# Patient Record
Sex: Male | Born: 1980 | Race: White | Hispanic: No | Marital: Single | Smoking: Never smoker
Health system: Southern US, Community
[De-identification: ages and names within clinical notes are randomized; demographics above are authoritative.]

## PROBLEM LIST (undated history)

## (undated) DIAGNOSIS — F419 Anxiety disorder, unspecified: Secondary | ICD-10-CM

## (undated) DIAGNOSIS — F32A Depression, unspecified: Secondary | ICD-10-CM

## (undated) NOTE — ED Notes (Signed)
Formatting of this note might be different from the original.  Pt refused vitals  Electronically signed by Fulton Mole, RN at 03/10/2021  8:55 PM PST

## (undated) NOTE — ED Notes (Signed)
Formatting of this note might be different from the original.  Pt sleeping comfortably   Electronically signed by Fulton Mole, RN at 03/10/2021 11:00 PM PST

## (undated) NOTE — ED Provider Notes (Signed)
Formatting of this note is different from the original.    History  Chief Complaint   Patient presents with   ? Depression   ? Alcohol Intoxication     HPI    35 year old male history of EtOH abuse, depression, presents to the ED for intoxication.  Patient admits to drinking multiple large beers.  Patient states he feels depressed however he denies any suicidal or homicidal thoughts.  States he is concerned about his girlfriend she is not answering her phone.  Denies any auditory or visual hallucinations.  Denies any fall or head injury, chest pain, abdominal pain, nausea, vomiting.    Past Medical History:   Diagnosis Date   ? Alcoholism (CMS/HCC)    ? Depression      History reviewed. No pertinent surgical history.    History reviewed. No pertinent family history.  .  Social History     Tobacco Use   ? Smoking status: Never   ? Smokeless tobacco: Never   Vaping Use   ? Vaping Use: never used   Substance Use Topics   ? Alcohol use: Yes   ? Drug use: Not Currently     ROS - All other negative    Physical Exam  BP 104/66 (BP Location: Right arm, Patient Position: Sitting)   Pulse 116   Temp 98.7 F (37.1 C) (Oral)   Resp 22   Ht 5\' 7"  (1.702 m)   Wt 80 kg (176 lb 5.9 oz)   SpO2 99%   BMI 27.62 kg/m     Physical Exam  CONSTITUTIONAL: alert, active; well-appearing; no apparent distress  HEAD: normocephalic, atraumatic  EYES: PERRL; EOM intact  NOSE: b/l nose clear  THROAT: moist mucous membranes; no erythema or exudate  NECK: non-tender; supple; no JVD; no goiter  CARD: RRR; normal S1, S2; no murmurs, rubs, or gallops  RESP: breath sounds clear and equal bilaterally; normal chest excursion with respiration; no wheezes, rhonchi, or rales  ABD/GI: soft; non-tender; non-distended; no rebound or guarding; normal bowel sounds  EXT/MS: moves all extremities; neurovascular intact; no clubbing, cyanosis, or peripheral edema; no tenderness to palpation;  SKIN: normal for age and race; warm and dry with good  perfusion  NEURO: awake and alert; no gross focal deficits, Ambulatory around the ED without any difficulty.  PSYCH: Depressed, denies SI/HI, denies AH/VH           Results  Labs Reviewed   POCT GLUCOSE - Abnormal       Result Value    POC Glucose 115 (*)      No results found.    ED Course    17 year old male presents to the ED for ingestion of alcohol and depression.  Patient declines any suicidal or homicidal ideation.    ED Course as of 03/11/21 0233   Wed Mar 11, 2021   2956 She is clinically sober, ambulating around the ED without any difficulty.  Patient still declining any suicidal or homicidal thoughts.  Patient states he is not depressed at this time.  Patient is awake alert and oriented x3.  Offers no complaint.  Patient states he will arrange transportation home.  Patient is stable for discharge. [CH]     ED Course User Index  [CH] Aris [place], DO     Procedures    MDM    Includes bedside time, response to interventions, interpretation of data, charting time, review of old records, discussions with consultants, EMS, family, nurses, etc  The following tasks were completed as part of medical decision making for this visit:    Available past medical history, past surgical history, and social history reviewed  Available and relevant medical record reviewed  Labs and studies ordered during this visit were reviewed and interpreted  Consultation with consultants and/or inpatient physician staff as noted  EKG reviewed and interpreted  Imaging reviewed and interpreted  PDMP reviewed    Shared decision making regarding findings and plan of care were discussed in detail with the patient and/or family member.    ED Disposition    Final diagnoses:   Alcohol abuse       Discharge       Medication List     ASK your doctor about these medications    LORazepam 1 MG tablet  Commonly known as: ATIVAN  Take 1 tablet (1 mg total) by mouth every 8 (eight) hours as needed for anxiety        Bunnie Philips, DO  [address]  [place] [address]  [phone]    Schedule an appointment as soon as possible for a visit in 2 days    [facility]  [address]  [place] [address]  [phone]    If symptoms worsen    Dragon disclaimer - "Portions of this chart may have been created with Dragon voice recognition software. Occasional wrong-word or "sound-alike" substitutions may have occurred due to the inherent limitations of voice recognition software. Please read the chart carefully and recognize, using context, where these substitutions have occurred. "        Aris [place], DO  03/11/21 4166    Electronically signed by Aris [place], DO at 03/10/2021 11:33 PM PST

## (undated) NOTE — ED Notes (Signed)
Formatting of this note might be different from the original.  I called pt into triage he refused.  I asked pt again to allow me to get VS, he denied and became very angry and cursing at this RN.  Sts I am leaving I do not want to be here anyway while still cursing at this RN.  Ambulatory with steady gait.    Electronically signed by Claud Kelp, RN at 09/19/2022  9:29 PM EDT

## (undated) NOTE — ED Provider Notes (Signed)
Formatting of this note is different from the original.  Medical Screening Exam  I saw the patient as a provider in triage and performed a brief history and physical examination.  I ordered appropriate testing.  Patient will be seen by another APP and or physician who will evaluate the patient further. Pt will be under care of nursing staff until given a bed.    HPI/ROS: He said he is sad, depressed and dealing with a lot has a lot going on.  Denies SI, HI. Denies AH, Vh  Exam: Sobbing uncontrollably.  Yelling.  Angry.  No acute distress  Dx/MDM: Depression, anxiety, underlying psychiatric disorder, EtOH abuse, other substance abuse, among others    Sarina Ill, [place]    [facility]    ED Provider Note    Gary [name] 1 y.o. male DOB: 29-Nov-1980 MRN: [id number]  History   No chief complaint on file.    HPI    No past medical history on file.    No past surgical history on file.    Social History     Substance and Sexual Activity   Alcohol Use Yes    Comment: pt stated he drinks daily till he passes out     Social History     Tobacco Use   Smoking Status Never    Passive exposure: Never   Smokeless Tobacco Never     E-Cigarettes    Vaping Use Never User     Start Date      Cartridges/Day      Quit Date       Social History     Substance and Sexual Activity   Drug Use Not Currently           Allergies   Allergen Reactions    Vivitrol [Naltrexone] Anaphylaxis    Abilify Nausea And Vomiting    Aripiprazole Nausea And Vomiting     Patient reports it causes him to bite his mouth     Vomiting    Haldol [Haloperidol] Other     Patient states have "convulsions".    Lithium Other     Says it "affects his thyroid".    Effexor [Venlafaxine] Nausea Only     Home Medications    DICLOFENAC SODIUM (VOLTAREN) 75 MG EC TABLET    Take one tablet (75 mg dose) by mouth 2 (two) times daily.    GABAPENTIN (NEURONTIN) 300 MG CAPSULE    Take one capsule (300 mg dose) by mouth 2 (two) times daily.    MIRTAZAPINE  (REMERON) 15 MG TABLET    Take one tablet (15 mg dose) by mouth at bedtime.    OMEPRAZOLE (PRILOSEC) 40 MG CAPSULE    Take one capsule (40 mg dose) by mouth daily for 30 days.    PANTOPRAZOLE SODIUM (PROTONIX) 40 MG TABLET    Take one tablet (40 mg dose) by mouth daily for 30 days.     Review of Systems     Review of Systems    Physical Exam     ED Triage Vitals   BP    Pulse    Resp    SpO2    Temp      Physical Exam    ED Course     Lab results:  No data to display    Imaging:  No data to display    ECG:  ECG Results    None  Pre-Sedation  Procedures      Medical Decision Making  Amount and/or Complexity of Data Reviewed  Labs: ordered.    Provider Communication    New Prescriptions    No medications on file     Modified Medications    No medications on file     Discontinued Medications    No medications on file     Clinical Impression     Final diagnoses:   Crying   Feeling of sadness     ED Disposition       None               Patient left before completion of medical screening exam. Test results that were initiated for this visit have been reviewed: No critical values noted on diagnostic testing.      Electronically signed by:      Carvel Getting, MD  09/19/22 2233    Electronically signed by Carvel Getting, MD at 09/19/2022 10:33 PM EDT

## (undated) NOTE — ED Notes (Signed)
Formatting of this note might be different from the original.  Pt given cdc antimicrobial information sheet.  Electronically signed by Fulton Mole, RN at 03/10/2021 11:34 PM PST

## (undated) NOTE — Unmapped External Note (Signed)
 Formatting of this note might be different from the original.  Ct has been accepted to [facility] per Dr. Olumide Oluwabusi.   Dx: Major depressive disorder and alcohol  dependence  ETA: Acute Care will be arriving at 6:30pm to transport ct.  Pre-cert: MH delegate Harro[place] approved county funding.   days authorized: 5 days  ambulance auth: [id number]    **Ct was not referred to 1A due to ct needing dual treatment**  Electronically signed by Lucie Requena at 03/11/2023 12:07 PM PST

## (undated) NOTE — ED Notes (Signed)
 Formatting of this note might be different from the original.  Patient is currently sleeping in room at this time. No increase or change in behavior. Noticed normal breathing pattern, no signs of distress noted.   Electronically signed by Avel Marina, LVN at 03/10/2023 10:17 PM PST

## (undated) NOTE — ED Notes (Signed)
 Formatting of this note might be different from the original.  Patient states he just came back to [place]  to live 3 weeks ago from [place] .   Electronically signed by Avel Marina, LVN at 03/10/2023  9:33 PM PST
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
## (undated) NOTE — Unmapped External Note (Signed)
 Formatting of this note might be different from the original.  Pt in by EMS with c/o ETOH. N/V. Non-cooperative. Refusing vitals   Electronically signed by Nat CHRISTELLA Roles, RN at 11/21/2023  4:04 PM PDT

## (undated) NOTE — ED Notes (Signed)
 Formatting of this note might be different from the original.  Patient refused for IV line and IV Medications  Electronically signed by Christopher Castle, RN at 11/21/2023  5:30 PM PDT

## (undated) NOTE — ED Notes (Signed)
 Formatting of this note might be different from the original.  Pt alert and awake, pt picked up by Acute care to go to another facility. Pt calm and cooperative at time of departure.   Electronically signed by Jannine Gunner, LVN at 03/11/2023  3:42 PM PST

## (undated) NOTE — Nursing Note (Signed)
 Formatting of this note might be different from the original.  CBC resulted, pt is medically cleared for Psych placement per MD.  Electronically signed by Vela Byers (R.N.) at 02/14/2010  4:20 AM PDT

## (undated) NOTE — Unmapped External Note (Signed)
 Formatting of this note might be different from the original.  2050--sitter justification was sent to staffing on this patient  Electronically signed by Tillman Minder at 10/07/2023  5:52 PM PDT

## (undated) NOTE — ED Notes (Signed)
 Formatting of this note might be different from the original.  Patient is resting comfortably.  Electronically signed by Christopher Castle, RN at 11/21/2023  5:43 PM PDT

## (undated) NOTE — Nursing Note (Signed)
 Formatting of this note might be different from the original.  [facility] accepting pt.Nurse to nurse report given to Evan. Accepting MD is Dr.Soares. Pt is going to Crisis Unit. Address is [address]. And phone number is [phone]  Electronically signed by Thurmon Locus (R.N.) at 02/14/2010  1:41 PM PDT

## (undated) NOTE — Consults (Signed)
 Formatting of this note might be different from the original.  Recent nursing note indicates pt has been accepted to [facility].    5150 Re-Eval  Pt continues to endorse suicidal ideation and plan to jump off bridge.  He expresses despondency about his relationship with fiancee.  Electronically signed by Harvis Devere BROCKS (Phd) at 02/14/2010  1:54 PM PDT

## (undated) NOTE — ED Notes (Signed)
 Formatting of this note might be different from the original.  Pt awake and alert. Pt noted yelling and cursing at staff upon start of shift. Pt not easily redirected. Nurse informed MD of findings. MD states understanding. Haldol 5mg /ml , Benadrayl  VORBV Dr. ONEIDA  Electronically signed by Clayborne Hick, LVN at 10/07/2023  5:00 PM PDT

## (undated) NOTE — ED Provider Notes (Signed)
 Formatting of this note is different from the original.  Emergency Department Encounter  Name:   Marc Ramsey MRN:   [id number]   DOB:   Oct 13, 1980 Acct:   [id number]   Age:   25 y.o. Arrival:   11/21/23 at 1806   Sex:   male ED Provider:   Bernis VEAR Grand, MD   PCP:  PCP, [name], MD Location:   [facility] EMERGENCY     Chief Complaint  Chief Complaint   Patient presents with    Alcohol  Problem    Alcohol  Intoxication     Patient is here with alcohol  intoxication.      HPI  73 year old male, history of alcoholism, anxiety, depression, PTSD, presents to ED for evaluation.  History obtained from triage note.  States patient arrived via EMS for alcohol  intoxication.  It is unclear where patient was picked up from and why.  Patient is yelling, cursing, uncooperative, belligerent.  There is also some coffee-ground emesis on the floor beside his stretcher.    ROS  Review of Systems   Unable to perform ROS: Other (Uncooperative)       PHYSICAL EXAM  BP 123/74 (BP Location: Right arm, Patient Position: Sitting)   Pulse 94   Temp 98.2 F (36.8 C) (Oral)   Resp 18   Ht 5' 7 (1.702 m)   Wt 77.1 kg (169 lb 15.6 oz)   SpO2 94%   BMI 26.62 kg/m   Vitals:    11/21/23 1937   BP: 123/74   BP Location: Right arm   Patient Position: Sitting   Pulse: 94   Resp: 18   Temp: 98.2 F (36.8 C)   TempSrc: Oral   SpO2: 94%   Weight: 77.1 kg (169 lb 15.6 oz)   Height: 5' 7 (1.702 m)     Physical Exam  Vitals and nursing note reviewed.   Constitutional:       General: He is not in acute distress.     Comments: Obviously intoxicated   HENT:      Head: Normocephalic and atraumatic.      Mouth/Throat:      Mouth: Mucous membranes are moist.   Eyes:      Extraocular Movements: Extraocular movements intact.      Conjunctiva/sclera: Conjunctivae normal.   Cardiovascular:      Rate and Rhythm: Normal rate and regular rhythm.   Pulmonary:      Effort: Pulmonary effort is normal. No respiratory distress.   Abdominal:      General: There is no  distension.   Musculoskeletal:         General: Normal range of motion.      Cervical back: Normal range of motion.   Skin:     General: Skin is warm and dry.   Neurological:      Mental Status: He is alert.      Comments: Intoxicated, walking around the room, moving all extremities, no obvious deficits   Psychiatric:      Comments: Yelling, cursing, belligerent, uncooperative     PAST MEDICAL HISTORY  Past Medical History:   Diagnosis Date    Alcoholism (CMS/HCC)     Anxiety     Depression     PTSD (post-traumatic stress disorder)      SURGICAL HISTORY  No past surgical history on file.    CURRENT MEDICATIONS    Previous Medications    OMEPRAZOLE (PRILOSEC) 40 MG CAPSULE    Take 1 capsule (40  mg total) by mouth daily  Ran out a couple months ago     ALLERGIES  Allergies   Allergen Reactions    Abilify [Aripiprazole]      Vomiting     Effexor [Venlafaxine]      nausea    Geodon [Ziprasidone]      unknown    Risperidone      Tired     Propranolol Rash     FAMILY HISTORY  No family history on file.    SOCIAL HISTORY  Social History     Socioeconomic History    Marital status: Divorced     Spouse name: Not on file    Number of children: Not on file    Years of education: Not on file    Highest education level: Not on file   Occupational History    Not on file   Tobacco Use    Smoking status: Never    Smokeless tobacco: Never   Vaping Use    Vaping status: never used   Substance and Sexual Activity    Alcohol  use: Yes     Comment: volka at least a fifth daily    Drug use: Not Currently    Sexual activity: Not on file   Other Topics Concern    Not on file   Social History Narrative    Not on file     Social Determinants of Health     Financial Resource Strain: High Risk (11/16/2021)    Received from [facility]    Overall Financial Resource Strain (CARDIA)     Difficulty of Paying Living Expenses: Very hard   Food Insecurity: Food Insecurity Present (07/21/2023)    Received from [facility]    Hunger Vital Sign      Worried About Running Out of Food in the Last Year: Often true     Ran Out of Food in the Last Year: Often true   Transportation Needs: Unmet Transportation Needs (07/21/2023)    Received from [facility] - Transportation     Lack of Transportation (Medical): Yes     Lack of Transportation (Non-Medical): Yes   Physical Activity: Not on file   Stress: Stress Concern Present (07/21/2023)    Received from [facility] - Occupational Stress Questionnaire     Feeling of Stress : Rather much   Social Connections: Unknown (09/01/2021)    Received from [facility]    Social Network     Social Network: Not on file   Intimate Partner Violence: Not At Risk (07/21/2023)    Received from [facility]    HITS     Over the last 12 months how often did your partner physically hurt you?: Never     Over the last 12 months how often did your partner insult you or talk down to you?: Never     Over the last 12 months how often did your partner threaten you with physical harm?: Never     Over the last 12 months how often did your partner scream or curse at you?: Never   Housing Stability: High Risk (07/21/2023)    Received from [facility] Stability Vital Sign     Unable to Pay for Housing in the Last Year: Yes     Number of Times Moved in the Last Year: 2     Homeless in the Last Year:  Yes     Labs  Labs Reviewed - No data to display    Radiology  Imaging Results    None      -------------------------------------------------------------------------------------------  MEDICAL DECISION MAKING (MDM) AND ED COURSE    39 year old male, history of alcoholism, anxiety, depression, PTSD, presents to ED for evaluation.  History obtained from triage note.  States patient arrived via EMS for alcohol  intoxication.  It is unclear where patient was picked up from and why.  Patient is yelling, cursing, uncooperative, belligerent.  There is also some coffee-ground emesis on the floor  beside his stretcher.    Omeprazole listed as a home med.  Patient likely has history of GERD.  This combined with his alcohol  use is likely resulting in this coffee-ground in the says that is present.  Vitals are normal.  Patient called his friend/co-worker and I spoke with him over the phone.  He then came up to the ER.  He states that this is a normal presentation for the patient.  He feels comfortable taking patient back to the hotel that they are staying at and keep an eye on him.  We will cancel blood work.  Advised him to bring patient back to the ER if needed.    ED Course as of 11/21/23 2043   Mon Nov 21, 2023   2040 Patient's friend, Marc Ramsey, is here at bedside.  States patient's intoxication is a common occurrence and his belligerence is also common.  States he works with the patient and they all stay in the same hotel, separate rooms.  However, he states he will stay in the room with the patient tonight to make sure that he sobers up and is not a danger to himself. [TG]     ED Course User Index  [TG] Bernis VEAR Grand, MD     -------------------------------------------------------------------------------------------    1) Number and Complexity of Problems    Problem List This Visit:   Alcohol  Problem and Alcohol  Intoxication (Patient is here with alcohol  intoxication. )    Differential Diagnosis includes (but not limited to):   Alcoholic gastritis, alcohol  abuse      Pertinent Comorbid Conditions:    See HPI, PMH and PSH    Complexity of Problem   This patient has:  2 problem(s) addressed during this visit   One of which includes: Chronic illness/injury    2)  Data Reviewed (none if left blank)    External Documentation Reviewed: Yes, as necessary    Previous patient encounter documents & history available on EMR was reviewed: Yes    See Formal Diagnostic Results above for the lab and radiology tests and orders.      3)  Treatment and Disposition    Morbidity/ Mortality Of Patient Management    During  this visit, the patient has received:      Medications - No data to display     Disposition: Discharge to home    COMPLEXITY OF WORKUP     I have not reviewed the results of each ordered lab result.     I have reviewed prior documentation from an outside department.    I have obtained a history from an independent historian (someone other than the patient).    I have not independently viewed and interpreted the patient's radiologic imaging.    I have not discussed the management with an external physician or APP as a consultant.     ------------------------------------------------------------------------------------------------------------------  FINAL IMPRESSION AND DISPOSITION  1. Alcohol  intoxication    2. Gastritis      Plan/Orders Assosiated with Diagnosis  and ICD Codes     Diagnosis Name ICD-10-CM Plan/Orders Assosiated with Diagnosis   1. Alcohol  intoxication  F10.929      2. Gastritis  K29.70          Shared Decision-Making:   Treatment plan and disposition discussed with the patient/family, questions answered     PATIENT REFERRED TO:   Follow-up Information       [facility].    Specialty: Internal Medicine  Contact information  [address] [place] [address]  [place]  [address]  [phone]           Carlin LOISE Sauers, MD.    Specialties: Gastroenterology, Internal Medicine  Contact information  [address]  [place] [address]  [phone]                    DISCHARGE MEDICATIONS:  New Prescriptions    No medications on file       Bernis VEAR Grand, MD  11/21/23 2053    Electronically signed by Bernis VEAR Grand, MD at 11/21/2023  5:53 PM PDT

## (undated) NOTE — Unmapped External Note (Signed)
 Formatting of this note might be different from the original.  This assessor was notified by Psych PA DOROTHA Hasten that the pt has been evaluated and cleared for discharge by psych. This assessor placed outpatient mental health referrals, homeless shelter info, substance abuse treatment info, and crisis line numbers in pt's chart for discharge follow up.    Lauraine HERO., [facility]  Electronically signed by Lauraine Ness, LPC at 10/08/2023  8:16 AM PDT

## (undated) NOTE — ED Notes (Signed)
 Formatting of this note might be different from the original.  Pt called for room x3 but no answer. Pt left the ED without being seen.  Electronically signed by Doyal Bevels, RN at 10/15/2023  6:37 PM PDT

## (undated) NOTE — Nursing Note (Signed)
 Formatting of this note might be different from the original.  Patient received into room 7.  Vital signs checked and patient settled to rest.  Electronically signed by Wenceslao Olivia CROME (R.N.) at 02/14/2010  5:08 AM PDT

## (undated) NOTE — ED Notes (Signed)
 Formatting of this note might be different from the original.  Patient given lunch tray.  Electronically signed by Bernarda Abts, LVN at 10/08/2023  8:09 AM PDT

## (undated) NOTE — ED Notes (Signed)
 Formatting of this note might be different from the original.  Patient is lying down quiet with eyes closed. Patient chest rises and falls and no respiratory distress observed. Patient has side rail up for safety. Patient has sitter at bedside and is in the hall bed right in front of nurse station.  Electronically signed by Bernarda Abts, LVN at 10/07/2023  3:26 PM PDT

## (undated) NOTE — ED Notes (Signed)
 Formatting of this note is different from the original.      Patient brought by EMS with complaints alcohol  intoxication. Patient assigned to room 25 ,Patient is restless and vomiting     Electronically signed by Christopher Castle, RN at 11/21/2023  5:32 PM PDT

## (undated) NOTE — ED Notes (Signed)
 Formatting of this note might be different from the original.  Patient is currently sleeping in room at this time. Patient to be evaluated by [facility] in the morning when ETOH is under 100.   Electronically signed by Avel Marina, LVN at 03/11/2023  2:39 AM PST

## (undated) NOTE — ED Notes (Signed)
 Formatting of this note might be different from the original.  Pt unchanged at this time.  Electronically signed by Clayborne Hick, LVN at 10/08/2023  3:52 AM PDT

## (undated) NOTE — ED Provider Notes (Signed)
 Formatting of this note is different from the original.  Emergency Department Encounter  Name:   Marc Ramsey MRN:   [id number]   DOB:   09-04-1980 Acct:   [id number]   Age:   81 y.o. Arrival:   10/07/23 at 1533   Sex:   male ED Provider:   [name], MD   PCP:  PCP, [name], MD Location:   [facility] EMERGENCY     Chief Complaint  Chief Complaint   Patient presents with    Alcohol  Intoxication     Pt in by EMS with c/o ETOH and SI for the last few months     HPI  49 y.o. male with a history of chronic alcoholism brought in by EMS with alcohol  intoxication and suicide ideation for the last few months worse in the last 24 hours.  When asked specifically patient did not have a specific way of head in his left.  Patient however was using profanity.  No other modifying factors reported.    Code Status:    Reviewed with patient and/or family as full     ROS  Review of Systems   Constitutional: Negative.    HENT: Negative.     Eyes: Negative.    Respiratory: Negative.     Cardiovascular: Negative.    Gastrointestinal: Negative.    Endocrine: Negative.    Musculoskeletal: Negative.    Allergic/Immunologic: Negative.    Neurological: Negative.    Hematological: Negative.    Psychiatric/Behavioral:  Positive for agitation. The patient is nervous/anxious.    All other systems reviewed and are negative.  Genitourinary: Negative.        PHYSICAL EXAM  BP 117/82 (BP Location: Right arm, Patient Position: Sitting)   Pulse 79   Temp 97.8 F (36.6 C) (Oral) Comment (Src): Simultaneous filing. User may not have seen previous data.  Resp 18   Wt 72.6 kg (160 lb)   SpO2 97%   BMI 25.06 kg/m   Vitals:    10/07/23 1554 10/07/23 1556   BP: 117/82    BP Location: Right arm    Patient Position: Sitting    Pulse: 79    Resp: 18    Temp: 97.8 F (36.6 C)    TempSrc: Oral    SpO2: 97%    Weight:  72.6 kg (160 lb)     Physical Exam  Vitals and nursing note reviewed.   Constitutional:       Appearance: He is well-developed.   HENT:       Head: Normocephalic and atraumatic.      Nose: Nose normal.      Mouth/Throat:      Mouth: Mucous membranes are dry.   Eyes:      Conjunctiva/sclera: Conjunctivae normal.   Cardiovascular:      Rate and Rhythm: Normal rate and regular rhythm.      Heart sounds: Normal heart sounds.   Pulmonary:      Effort: Pulmonary effort is normal.      Breath sounds: Normal breath sounds.   Abdominal:      General: Bowel sounds are normal.      Palpations: Abdomen is soft.   Musculoskeletal:         General: Normal range of motion.      Cervical back: Neck supple.   Skin:     General: Skin is warm.   Neurological:      General: No focal deficit present.  Mental Status: He is alert.      Comments: Appeared intoxicated      PAST MEDICAL HISTORY  Past Medical History:   Diagnosis Date    Alcoholism (CMS/HCC)     Anxiety     Depression     PTSD (post-traumatic stress disorder)      SURGICAL HISTORY  No past surgical history on file.    CURRENT MEDICATIONS    Previous Medications    OMEPRAZOLE (PRILOSEC) 40 MG CAPSULE    Take 1 capsule (40 mg total) by mouth daily  Ran out a couple months ago     ALLERGIES  Allergies   Allergen Reactions    Abilify [Aripiprazole]      Vomiting     Effexor [Venlafaxine]      nausea    Geodon [Ziprasidone]      unknown    Haldol [Haloperidol] Other (See Comments)     Mouth sores    Risperidone      Tired     Propranolol Rash     FAMILY HISTORY  No family history on file.    SOCIAL HISTORY  Social History     Socioeconomic History    Marital status: Divorced     Spouse name: Not on file    Number of children: Not on file    Years of education: Not on file    Highest education level: Not on file   Occupational History    Not on file   Tobacco Use    Smoking status: Never    Smokeless tobacco: Never   Vaping Use    Vaping status: never used   Substance and Sexual Activity    Alcohol  use: Yes     Comment: volka at least a fifth daily    Drug use: Not Currently    Sexual activity: Not on file   Other  Topics Concern    Not on file   Social History Narrative    Not on file     Social Determinants of Health     Financial Resource Strain: High Risk (11/16/2021)    Received from [facility]    Overall Financial Resource Strain (CARDIA)     Difficulty of Paying Living Expenses: Very hard   Food Insecurity: Food Insecurity Present (07/21/2023)    Received from [facility]    Hunger Vital Sign     Worried About Running Out of Food in the Last Year: Often true     Ran Out of Food in the Last Year: Often true   Transportation Needs: Unmet Transportation Needs (07/21/2023)    Received from [facility] - Transportation     Lack of Transportation (Medical): Yes     Lack of Transportation (Non-Medical): Yes   Physical Activity: Not on file   Stress: Stress Concern Present (07/21/2023)    Received from [facility] Occupational Health - Occupational Stress Questionnaire     Feeling of Stress : Rather much   Social Connections: Unknown (09/01/2021)    Received from [facility]    Social Network     Social Network: Not on file   Intimate Partner Violence: Not At Risk (07/21/2023)    Received from [facility]    HITS     Over the last 12 months how often did your partner physically hurt you?: Never     Over the last 12 months how often did your partner  insult you or talk down to you?: Never     Over the last 12 months how often did your partner threaten you with physical harm?: Never     Over the last 12 months how often did your partner scream or curse at you?: Never   Housing Stability: High Risk (07/21/2023)    Received from [facility] Stability Vital Sign     Unable to Pay for Housing in the Last Year: Yes     Number of Times Moved in the Last Year: 2     Homeless in the Last Year: Yes     Labs  Labs Reviewed   URINALYSIS, W/ REFLEX TO MICRO AND CULT, IF INDICATED - Abnormal       Result Value    Color, UA Colorless      Clarity, UA Clear      Specific Gravity, UA 1.002 (*)      pH, UA 6.0      Protein, UA Negative      Glucose, UA Negative      Ketones, UA Negative      Blood, UA Negative      Bilirubin, UA Negative      Nitrite, UA Negative      Leukocyte esterase, UA Negative      Urobilinogen, UA Normal     CBC W/ AUTO DIFF   COMPREHENSIVE METABOLIC PANEL   URINE DRUG SCREEN   ACETAMINOPHEN LEVEL   SALICYLATE LEVEL   ETHANOL     Radiology  Imaging Results    None      -------------------------------------------------------------------------------------------  MEDICAL DECISION MAKING (MDM) AND ED COURSE  Here with possible alcohol  intoxication   Here with depression and feeling and suicidal thoughts--differential diagnosis, including but not limited to: Encounter for behavioral health screening examination, encounter for medical screening examination--Due to these will go ahead and other routine labs studies including CBC, CMP and UA with UDS and thyroid profile in anticipation for mental health evaluation.     Assessment and plan: here with concern with depressive feeling and suicidal ideation --but noted with reassuring vital signs, in no acute distress, who is cooperative, ANO x3, not homicidal but suicidal.  At this point in time, this patient is cleared medically for psychiatry evaluation and recommendation. I will go ahead and order a 1013. He has not demonstrated any witnessed behavior here in the ED that would be consistent with acute decompensated psychosis.    I have ordered mental health evaluation.    Will go ahead and order typical laboratory studies in anticipation of mental health requests.      Laboratory studies are reviewed and are unremarkable at this time.  Awaiting mental health consultation and evaluation.  Ultimate disposition as per mental health team.  At this point in time, this patient does not appear to have an immediate medical contraindication to psychiatric admission, evaluation, consultation and placement.    Patient has had his 1013 filled out by myself.   Holding orders initiated.  and anticipation of psychiatric placement.  As needed medications are ordered.  Patient remained suitable at this time for psychiatric placement, consultation and disposition.  He does not appear to have an emergent medical condition present at this time.        -------------------------------------------------------------------------------------------    1) Number and Complexity of Problems    Problem List This Visit:   Alcohol  Intoxication (Pt in by EMS with c/o ETOH and SI for the  last few months)    Differential Diagnosis includes (but not limited to):   Acute psychosis  Bipolar manic episode  Schizophrenia  Paranoia  PTSD  Anxiety and depression  Alcohol  intoxication      Pertinent Comorbid Conditions:    See HPI, PMH and PSH    Complexity of Problem   This patient has:  >2 problem(s) addressed during this visit   One of which includes: Acute or chronic illness/injury that poses a threat to life or bodily function    2)  Data Reviewed (none if left blank)    External Documentation Reviewed: Yes, as necessary    Previous patient encounter documents & history available on EMR was reviewed: Yes    See Formal Diagnostic Results above for the lab and radiology tests and orders.      3)  Treatment and Disposition    Morbidity/ Mortality Of Patient Management    During this visit, the patient has received:   Drug therapy requiring intensive monitoring     Medications - No data to display     Disposition: Transfer to [facility] mental health unit - medically cleared for admission    COMPLEXITY OF WORKUP     I have reviewed the results of each ordered lab result.     I have not reviewed prior documentation from an outside department.    I have not obtained a history from an independent historian (someone other than the patient).    I have independently viewed and interpreted the patient's radiologic imaging.    I have not discussed the management with an external physician or APP as a  Research scientist (medical).     PROCEDURES: Procedures      Please see MDM section and rest of the note for further information on patient assessment and treatment.     ------------------------------------------------------------------------------------------------------------------  FINAL IMPRESSION AND DISPOSITION   1. Alcohol  intoxication in active alcoholic with complication    2. Suicide ideation      Plan/Orders Assosiated with Diagnosis  and ICD Codes     Diagnosis Name ICD-10-CM Plan/Orders Assosiated with Diagnosis   1. Alcohol  intoxication in active alcoholic with complication  F10.229      2. Suicide ideation  R45.851          Shared Decision-Making:   Treatment plan and disposition discussed with the patient/family, questions answered     PATIENT REFERRED TO:    DISCHARGE MEDICATIONS:  New Prescriptions    No medications on file       [name], MD  10/08/23 1913    Electronically signed by [name], MD at 10/08/2023  4:13 PM PDT

## (undated) NOTE — ED Notes (Signed)
 Formatting of this note might be different from the original.  Patient given dinner tray.  Electronically signed by Bernarda Abts, LVN at 10/07/2023  1:13 PM PDT

## (undated) NOTE — Nursing Note (Signed)
 Formatting of this note might be different from the original.  Transport here to pick pt up, no incidents noted  Electronically signed by Thurmon Locus (R.N.) at 02/14/2010  2:47 PM PDT
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
## (undated) NOTE — ED Notes (Signed)
 Formatting of this note might be different from the original.  Pt accepted to [facility] and is set to leave for 1830.   Electronically signed by Jannine Gunner, LVN at 03/11/2023 12:23 PM PST

## (undated) NOTE — ED Notes (Signed)
 Formatting of this note might be different from the original.  Patient is currently sleeping in room at this time, No increase behaviors noted during night. Normal breathing pattern noticed, no signs of distress or pain. Will continue to monitor.   Electronically signed by Avel Marina, LVN at 03/11/2023  2:40 AM PST

## (undated) NOTE — Nursing Note (Signed)
 Formatting of this note might be different from the original.  Received report. Pt sleeping. Security at bs.   Electronically signed by Bonner Ellouise FURY (R.N.) at 02/14/2010  7:43 AM PDT

## (undated) NOTE — ED Provider Notes (Signed)
Formatting of this note is different from the original.  Medical Screening Exam  I saw the patient as a provider in triage and performed a brief history and physical examination.  I ordered appropriate testing.  Patient will be seen by another APP and or physician who will evaluate the patient further. Pt will be under care of nursing staff until given a bed.    HPI/ROS: He said he is sad, depressed and dealing with a lot has a lot going on.  Denies SI, HI. Denies AH, Vh  Exam: Sobbing uncontrollably.  Yelling.  Angry.  No acute distress  Dx/MDM: Depression, anxiety, underlying psychiatric disorder, EtOH abuse, other substance abuse, among others    Sarina Ill, [place]    [facility]    ED Provider Note    Marc Ramsey 1 y.o. male DOB: 29-Nov-1980 MRN: [id number]  History   No chief complaint on file.    HPI    No past medical history on file.    No past surgical history on file.    Social History     Substance and Sexual Activity   Alcohol Use Yes    Comment: pt stated he drinks daily till he passes out     Social History     Tobacco Use   Smoking Status Never    Passive exposure: Never   Smokeless Tobacco Never     E-Cigarettes    Vaping Use Never User     Start Date      Cartridges/Day      Quit Date       Social History     Substance and Sexual Activity   Drug Use Not Currently           Allergies   Allergen Reactions    Vivitrol [Naltrexone] Anaphylaxis    Abilify Nausea And Vomiting    Aripiprazole Nausea And Vomiting     Patient reports it causes him to bite his mouth     Vomiting    Haldol [Haloperidol] Other     Patient states have "convulsions".    Lithium Other     Says it "affects his thyroid".    Effexor [Venlafaxine] Nausea Only     Home Medications    DICLOFENAC SODIUM (VOLTAREN) 75 MG EC TABLET    Take one tablet (75 mg dose) by mouth 2 (two) times daily.    GABAPENTIN (NEURONTIN) 300 MG CAPSULE    Take one capsule (300 mg dose) by mouth 2 (two) times daily.    MIRTAZAPINE  (REMERON) 15 MG TABLET    Take one tablet (15 mg dose) by mouth at bedtime.    OMEPRAZOLE (PRILOSEC) 40 MG CAPSULE    Take one capsule (40 mg dose) by mouth daily for 30 days.    PANTOPRAZOLE SODIUM (PROTONIX) 40 MG TABLET    Take one tablet (40 mg dose) by mouth daily for 30 days.     Review of Systems     Review of Systems    Physical Exam     ED Triage Vitals   BP    Pulse    Resp    SpO2    Temp      Physical Exam    ED Course     Lab results:  No data to display    Imaging:  No data to display    ECG:  ECG Results    None  Pre-Sedation  Procedures      Medical Decision Making  Amount and/or Complexity of Data Reviewed  Labs: ordered.    Provider Communication    New Prescriptions    No medications on file     Modified Medications    No medications on file     Discontinued Medications    No medications on file     Clinical Impression     Final diagnoses:   Crying   Feeling of sadness     ED Disposition       None               Patient left before completion of medical screening exam. Test results that were initiated for this visit have been reviewed: No critical values noted on diagnostic testing.      Electronically signed by:      Carvel Getting, MD  09/19/22 2233    Electronically signed by Carvel Getting, MD at 09/19/2022 10:33 PM EDT
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
## (undated) NOTE — H&P (Signed)
 Formatting of this note is different from the original.  PSYCHIATRIC DIAGNOSTIC INTERVIEW NOTE    Patient's Rights, confidentiality and exceptions to confidentiality, use of automated medical record, Primary Care Provider and Behavioral Health Services staff access to medical record, and consent to treatment were reviewed.    ID/REFERRING INFORMATION    Marc Ramsey is a 68 Y partnered, Caucasian male, brought in for evaluation and treatment at [facility] Emergency Department.    Legal Status: 5150    HISTORY:     History of Presenting Problem:  Pearly brought into the Emergency Department by Police and placed on a 5150 for danger to self.  Patient called police from the [facility] stating he was having thoughts of suicide.  Patient states he has thought of jumping off a bridge.  Patient lives with his fiance of 11 years.  They have 6 children.  They were together at [facility] buying diapers for their youngest child (32 days old).  Patient states they continued arguing and patient starting feeling depressed and suicidal. Patient has tried to kill himself 3 times in the past.  In 2009 and 2005 he tried to hang himself.  In 2003 he overdosed.  Patient is on SSI for bipolar disorder.  His social security number is:  [id number].     Psychiatric History:   Outpatient therapy:  medication management [facility] of [facility]  Psychiatric hospitalization: 6-7 times  Prior psychotropics: depakote, wellbutrin, and klonopin    SUBSTANCE ABUSE SCREENING:  Alcohol :  denies  Prescription drug abuse: denied  Recreational/illegal drug use:  denies  Hx of ETOH/Drug abuse (Not in Past 12 Months): denies  Tobacco products:denied  Counseled to quit Smoking? no  Caffeine: some    PSYCHOSOCIAL HISTORY:  Lives with fiance of 11 years and 6 children    Employment status: Disabled  Education level: High school  Living situation: lives with their family  Legal problems: There has been no history of involvement with legal system.  Financial  situation: stable  Support system: Name: Fiance:  Wanda Moose [phone]    Abuse assessment: none    Medical History:  No past medical history on file.  See CIPS and Active Problem List below    Current Medications  Current facility-administered medications   Medication   ? LORazepam  Tab 2 mg (ATIVAN )     Current outpatient prescriptions   Medication Sig   ? BUPROPION HCL (WELLBUTRIN ORAL) None Entered   ? DIVALPROEX SODIUM (DEPAKOTE ORAL) None Entered   ? clonazePAM (KlonoPIN) 0.5 mg Oral Tab None Entered     OTC Medication: none  Alternative/Herbal Remedies: none    MENTAL STATUS EXAMINATION    Appearance: well-groomed   Behavior: normal  Demeanor/Manner: pleasant and cooperative  Speech : soft  Mood: depressed  Affect: mood congruent  Thought process: logical  Thought Content: normal  Orientation: fully-oriented  Attention: normal  Concentration: normal  Memory:  Not tested, appears normal.  Fund of Knowledge: normal   Impulse Control: poor  Insight: fair  Judgment: poor    RISK ASSESSMENT:    Suicide Risk:  Ideation? yes  Consideration of methods? yes  Plan? yes  Intent? yes  Prior attempts? yes  Prior SI? yes  Family History of Suicide? no  Risk factors: history of bipolar disorder   Mitigating risk:  Family support  Reason for living:  Patient states he has suicidal ideation.    Homicide Risk: low  denies HI; denies hx of HI  Violence  to persons and/or objects: denies; denies hx of violence  Risk Factors:  History of bipolar disorder   Mitigating risk:  Family support    Access to firearms: no  Other risk issues:  denies  [place] warning indicated: no    Assessment of risk:        Suicide: high       Homicide: low       Violence: low    ASSESSMENT  Impression: patient has a history of bipolar disorder and several past suicide attempts.  Patient has suicidal ideation with plan to jump off a bridge.  Patient placed on 5150 by police.    DIAGNOSIS:   Axis I: history of bipolar disorder.  Axis II:  V71.09  Axis III: Patient Active Problem List:     BIPOLAR DISORDER    Axis IV: problems with relationships and problems related to social environment  Axis V: 41 - 50 serious symptoms    RECOMMENDATIONS FOR LEVEL OF CARE NEEDED:   Inpatient Psychiatric Treatment.    DISPOSITION ER:   Psychiatric Hospitalization 5150 hospitalization at an LPS [employer].  Case discussed with Dr. Rodgerson who agreed with plan.    Informed consent: Patient understands treatment.  Patient agrees to above recommendations: yes    Time Spent in Session: 45 minutes    MICHAEL PATRICK HEALY MFT   02/14/2010 1:52 AM      Electronically signed by Sallyann Ozell Dover (M.F.T.) at 02/14/2010  2:02 AM PDT

## (undated) NOTE — ED Notes (Signed)
 Formatting of this note might be different from the original.  Patient is a 77 YO male that is here for ETOH and is SI. Patient received with eyes closed and asleep. Patient respiratory is even non labored and chest rises and falls evenly. Patient no acute distress.  Electronically signed by Bernarda Abts, LVN at 10/08/2023  5:07 AM PDT

## (undated) NOTE — ED Notes (Signed)
 Formatting of this note might be different from the original.  Pt resting in bed , no complains of pain or other discomfort , no increased or change in behavior , Fluids and sandwich give , will continue monitor  Electronically signed by Lyle Hollingsworth, LVN at 03/10/2023 10:01 PM PST

## (undated) NOTE — Progress Notes (Signed)
 Formatting of this note is different from the original.  Behavioral Health ED Observation Progress Note Marc MALVA Speck, MD  LOS: 0 days   Marc Ramsey 42 y.o.([birth year]) MRN: [id number] Acct# [id number] [facility] EMERGENCY - PCP: PCP, [name], MD     Marc Ramsey     27 y.o.male@ presented for evaluation of alcohol  intoxication associated with suicide ideation.  Patient is seen by psychiatric this morning and deemed to be appropriate for outpatient treatment.  Please see the below recommendations for detail.    Physical Exam     24 Hour Min / Max Current   Temp  Min: 97.8 F (36.6 C)  Max: 98.6 F (37 C) 98.1 F (36.7 C)   BP  Min: 99/64  Max: 126/75 120/79    Pulse  Min: 72  Max: 79 72   Resp  Min: 16  Max: 20 16   SpO2  Min: 96 %  Max: 100 % 100 %   Admit: 72.6 kg (160 lb) 74.8 kg (165 lb)  Body mass index is 25.84 kg/m.     Physical Exam    Mental Status Exam:  Activity: Alert  Oriented to Person Time and Place  Normal Mood  Normal Affect  Does not seem under the influence of any psychoactive substances.  Relevant thought pattern  There are no Hallucinations  The patient is not Suicidal  The patient is not Homicidal    Data Review:     Labs Reviewed   CBC W/ AUTO DIFF - Abnormal       Result Value    WBC 6.20      RBC 4.84      Hemoglobin 14.5      Hematocrit 43.1      MCV 88.9      MCH 29.9      MCHC 33.6      RDW 15.9 (*)     Platelets 257      MPV 7.5      nRBC 0.10      Neutrophils (Relative) 57.9      Lymphocytes (Relative) 31.9      Monocytes (Relative) 8.8      Eosinophils (Relative) 0.8      Basophils (Relative) 0.6      Neutrophils (Absolute) 3.6      Lymphocytes (Absolute) 2.0      Monocytes (Absolute) 0.5      Eosinophils (Absolute) 0.0      Basophils (Absolute) 0.0      MDW 17.00     URINALYSIS, W/ REFLEX TO MICRO AND CULT, IF INDICATED - Abnormal    Color, UA Colorless      Clarity, UA Clear      Specific Gravity, UA 1.002 (*)     pH, UA 6.0      Protein, UA Negative      Glucose, UA Negative      Ketones,  UA Negative      Blood, UA Negative      Bilirubin, UA Negative      Nitrite, UA Negative      Leukocyte esterase, UA Negative      Urobilinogen, UA Normal     COMPREHENSIVE METABOLIC PANEL - Abnormal    Sodium 148 (*)     Potassium 3.8      Chloride 112 (*)     CO2 23.0      BUN 6.0      Creatinine 0.90      BUN/Creatinine Ratio 6.7 (*)  Glucose 90      Calcium 8.3 (*)     AST 27      ALT 21      Alkaline Phosphatase 84      Total Protein 7.1      Albumin 4.0      Total Bilirubin <0.2 (*)     A/G Ratio 1.3      Anion Gap 13.0      GLOBULIN 3.1      Glomerular Filtration Rate >90.0     ETHANOL - Abnormal    Ethanol Level 302.0 (*)     Narrative:     Toxic is >0.40%   URINE DRUG SCREEN - Normal    Amphetamine Negative      Barbiturate Negative      Benzodiazepine Negative      Cocaine Negative      METHADONE Negative      Opiate Negative      Phencyclidine Negative      Cannabinoid Negative      Narrative:        DRUG          CUTOFF Value    AMPHETAMINES      1000 NG/ML  BARBITURATES       200 NG/ML  BENZODIAZIPINE     200 NG/ML  COCAINE            300 NG/ML  METHADONE          300 NG/ML  OPIATES           2000 NG/ML  PCP                 25 NG/ML  THC                 50 NG/ML   ACETAMINOPHEN LEVEL - Normal    Acetaminophen Level <5.0      Narrative:     Toxic is >150 ug/ml after 4 hours.  Toxic is >50 ug/ml after 12 hours.   SALICYLATE LEVEL - Normal    Salicylate  <0.3       Meds   Scheduled Meds:  Continuous Infusions:  No current facility-administered medications for this encounter.     PRN Meds:.  D/C Medications (36h ago, onward)      None         Consult Orders   None   A/P     Assessment: Substance Abuse Disorder    Plan:  BH Plan: .D/c home     Alcohol  use disorder  Hx of PTSD, MDD    TREATMENT  Do not recommend acute inpatient psychiatric hospitalization at this time.  No new prescriptions required at present.    - Risks, benefits, and alternatives of medications discussed with patient; questions  answered and consent obtained from patient  - Advised patient to follow up with outpatient psychiatric provider within 1-2 weeks of discharge to establish outpatient psychiatric care including medication management and counseling services   - The patient should be compliant with medications and not use recreational drugs including alcohol . The patient understands that if suicidal ideations, homicidal ideations, or any endangering feeling arises, the patient should seek assistance including, but not limited to, crisis hotline and emergency room.  PSYCHOTHERAPY: Supportive psychotherapy provided    [name], MD  Electronic Signature  10/08/2023 3:19 PM    Please note that the chart has also been partially dictated, and there may be clerical errors.  The note  may not reflect the data known because of improper importation.    Electronically signed by Marc MALVA Speck, MD at 10/08/2023  4:25 PM PDT

## (undated) NOTE — Nursing Note (Signed)
 Formatting of this note might be different from the original.  Pt BIB police officer for suicidal ideation,pt is alert and cooperative ,blood sent to lab ,meds given,pt seen by Health visitor.meal provided.security on standby,belongings locked up by security.  Electronically signed by Naidu, Cathreen Savita (R.N.) at 02/14/2010  2:03 AM PDT

## (undated) NOTE — Nursing Note (Signed)
 Formatting of this note might be different from the original.  Ron from [facility] called, continuing to work on placement. No beds avail.  Electronically signed by Bonner Ellouise FURY (R.N.) at 02/14/2010 11:57 AM PDT

## (undated) NOTE — Nursing Note (Signed)
 Formatting of this note might be different from the original.  Psych call cent called to inquire about patient, CBC is required for placement;however not obtained, CBC ordered, will collect, MD notified to medically clear pt for d/c per request of call center.  Electronically signed by Vela Byers (R.N.) at 02/14/2010  4:07 AM PDT

## (undated) NOTE — ED Notes (Signed)
 Formatting of this note might be different from the original.  25 Year old male arrived to ER via [facility] police department with complaint to speak with crisis. Patient states he drank 2 pints of vodka tonight. Patient is slurring his words at this time. Patient has been evaluated by MD. Pre crisis labs completed and patient changed into blue scrub, belongings checked and put into secure area. Patient offered sandwich and hydration,warm blanket.  Electronically signed by Avel Marina, LVN at 03/10/2023  9:24 PM PST

## (undated) NOTE — Unmapped External Note (Signed)
 Formatting of this note is different from the original.  MENTAL HEALTH REFERRALS:  Professional and Agency Contacts To help Resolve Crises (24/7)  [place] Crisis Line: [phone]  Suicide Prevention Line: 769-053-3775  Crisis Text Line: Text ?START? to 258258  Emergency: 911    Outpatient COMMUNITY Behavioral Health Resources:  [facility]  [address] [place], [place]  [address]  Phone: [phone]     [place]:   [facility]  [address] [place], [place] [address]   Phone: [phone]  Monday thru Friday - 8am - 5pm  Call to schedule an assessment for mental health and substance abuse programs    [place]:  [facility]  Address: [address] [place], [place], [place] [address]  Monday thru Friday- 7am-2pm  Phone: [phone]    HOMELESS RESOURCES:    [facility]   NEED HELP?    If you are in need of help or know someone who does, please contact us  at [email or web address] or call [phone], or come to our offices at [address] [place], [place] [address], Monday-Friday beginning 9:30 AM-1:30 PM    [facility] M[facility] only  Admission at [facility] to Fri  Address: [address]  [place], [place] [address]  Client Engagement Center [phone]  Regular program admission occurs Monday through Friday at 7:00 am and operates on a first come, first serve basis. Because we can't anticipate program availability in advance and program spots are in high demand, we recommend arriving early. Space fills up fast!    [facility] Males only  Admission at 4:30pm daily  Address: [address] [place], [place], [place] [address]  Phone: [phone]    [facility] [employer] [facility]  Admission from 8am to 10am Daily   Address: [address] [place], [place], [place] [address]  Phone Number: [phone]    In case of an emergency, please contact the following numbers:  [place] Crisis and Access Line: Number: [phone]   Crisis Text Line: (Text ?START?)  Number: 258258   Suicide Prevention Line: Number: 1-208-430-9258   Emergency Number: 911     SUBSTANCE ABUSE PROGRAMS:    [facility]:  Location: [place], [place]  Phone: [phone]    [facility]:  Address: [address] [place], [place], [place] [address]  Phone: [phone]    [facility]:   Address: [address] [place], [place], [place] [address]  Phone: [phone]    [facility]:  Address: [address] [place], [place] [address]  Phone: [phone]    [facility]  Address: [address] [place], [place] [address].  Phone: [phone]    [facility]:  Address: [address] [place], [place], [place] [address]  Phone: [phone]    [facility]:  Address: [address], [place], [place] [address]  Phone: [phone]      Electronically signed by Lauraine Ness, LPC at 10/08/2023  7:52 AM PDT

## (undated) NOTE — ED Notes (Signed)
 Formatting of this note might be different from the original.  Patient is a 22 YO male that is here for ETOH. Patient is alert and oriented times two. Patient endorses coming to the hospital for SI. Patient is alert to self and place. Patient slurs when speaks with staff and is showing anxiety. Patient was given a dinner tray and ate meal. Patient observed with eyes closed lying in bed. Patient will not leave bedside rails up for safety and curses at the staff.  Electronically signed by Bernarda Abts, LVN at 10/07/2023  1:22 PM PDT

## (undated) NOTE — Consults (Signed)
 Formatting of this note is different from the original.  ED NURSING NOTE:   Patient is a 32 YO male that is here for ETOH. Patient is alert and oriented times two. Patient endorses coming to the hospital for SI. Patient is alert to self and place. Patient slurs when speaks with staff and is showing anxiety. Patient was given a dinner tray and ate meal. Patient observed with eyes closed lying in bed. Patient will not leave bedside rails up for safety and curses at the staff.     Patient is a 29 y.o. male with previous psychiatric diagnosis of PTSD and MDD who arrives to ED 10/07/2023 and seen for psychiatric evaluation. Patient was seen today. Patient was alert, oriented x3, calm and cooperative during interview. Patient identifies reason for arrival: drinking. Patient reports drinking a pint and a half, of vodka PTA; reports drinking heavily the last several days. Patient initially denies recent psychiatric complaints. When asked about report of SI, patient reports this was 2/2 intoxication and denies recent intent or plan to harm self. Patient denies taking psychiatric medications at present; denies interest in starting medications. Patient denies other needs at present and requests discharge. Patient affirms feeling safe to discharge and affirms feeling confident in being able to reach out to EMS in event psychiatric symptoms worsen.    Patient reports adequate sleep and good appetite. Patient denies recent history of symptoms suggestive of depression, mania, or psychosis. Patient denies feeling depressed, hopeless, or suicidal at present. Patient denies feeling anxious, paranoid, or unsafe at present. Patient denies SI HI and hallucinations of any kind. No overt psychosis, manic behavior, or endangering delusions noted on exam. Patient reports interest in counseling in outpatient setting on discharge. Discussed with patient importance of drug cessation; patient voiced understanding, but denies interest in  reducing alcohol  use at present.      PAST PSYCHIATRIC HISTORY:  Diagnoses: PTSD, MDD  Suicide attempts or Self-harm behavior: denies  Prior psychiatric hospitalizations: last hospitalization 2-3 months ago  Substance Abuse history: alcohol   Previous psychiatric medications tried: unable to recall  Outpatient treatment: denies    PAST MEDICAL HISTORY:   Past Medical History:   Diagnosis Date    Alcoholism (CMS/HCC)     Anxiety     Depression     PTSD (post-traumatic stress disorder)      SOCIAL HISTORY  Marital Status: divorced  Living Arrangements: homeless  Employment Status: not working  Education: high school  History of Abuse: yes  Legal History: denies  Access to guns/weapons: denies    REVIEW OF SYSTEMS  Constitutional: Negative for weight loss  ENT: Negative for stridor  Respiratory: Negative for cough or hemoptysis  All other systems reviewed and are negative    MENTAL STATUS EXAMINATION  General Appearance and Behavior: Age appropriate, wearing appropriate clothes, polite with questioning, good eye contact  Cooperation: cooperative  Psychomotor Behavior: Psychomotor within normal limits  Mood: okay, better  Affect and affective range: congruent with expressed mood, calm  Thought Process: goal-oriented, future-oriented, linear, organized  Thought Content: reality-based  Speech: Appropriate volume, Regular rate and rhythm  Suicidal Ideation: Denies  Homicidal Ideation: Denies  Hallucination: Denies  Delusions: None elicited  Impulse Control: Fair  Insight and Judgment: Fair  Memory: Fair  Attention: Intact  Orientation: Alert and oriented x3    Wt Readings from Last 3 Encounters:   10/08/23 74.8 kg (165 lb)   03/10/23 72.6 kg (160 lb)   03/10/21 80 kg (176 lb  5.9 oz)     Temp Readings from Last 3 Encounters:   10/08/23 98.1 F (36.7 C) (Oral)   03/10/23 99.3 F (37.4 C) (Oral)   03/10/21 98.7 F (37.1 C) (Oral)     BP Readings from Last 3 Encounters:   10/08/23 120/79   03/11/23 128/78   03/10/21 104/66      Pulse Readings from Last 3 Encounters:   10/08/23 72   03/11/23 88   03/10/21 116       Lab Results   Component Value Date    WBC 6.20 10/07/2023    HGB 14.5 10/07/2023    HCT 43.1 10/07/2023    PLT 257 10/07/2023    ALT 21 10/07/2023    AST 27 10/07/2023    NA 148 (H) 10/07/2023    K 3.8 10/07/2023    CL 112 (H) 10/07/2023    CREATININE 0.90 10/07/2023    BUN 6.0 10/07/2023    CO2 23.0 10/07/2023     No results found for: PHENYTOIN, PHENOBARB, VALPROATE, CBMZ   Lab Results   Component Value Date    NA 148 (H) 10/07/2023    BUN 6.0 10/07/2023    CREATININE 0.90 10/07/2023    WBC 6.20 10/07/2023     Eethanol Level: 302 micrograms/dL at 3/79/74 83:67    No current facility-administered medications for this encounter.    Current Outpatient Medications:     omeprazole (PriLOSEC) 40 MG capsule, Take 1 capsule (40 mg total) by mouth daily  Ran out a couple months ago, Disp: , Rfl:      ASSESSMENT  Alcohol  use disorder  Hx of PTSD, MDD    TREATMENT  Do not recommend acute inpatient psychiatric hospitalization at this time.  No new prescriptions required at present.    - Risks, benefits, and alternatives of medications discussed with patient; questions answered and consent obtained from patient  - Advised patient to follow up with outpatient psychiatric provider within 1-2 weeks of discharge to establish outpatient psychiatric care including medication management and counseling services   - The patient should be compliant with medications and not use recreational drugs including alcohol . The patient understands that if suicidal ideations, homicidal ideations, or any endangering feeling arises, the patient should seek assistance including, but not limited to, crisis hotline and emergency room.  PSYCHOTHERAPY: Supportive psychotherapy provided  MEDICAL: Per primary team  SAFETY SITTER: Defer to primary  DISPOSITION: Do not recommend acute inpatient psychiatric hospitalization at this time. Mental health assessor  will provide patient with outpatient psychiatric resources, resources for homeless shelters, and drug rehabilitation services.  LEGAL STATUS: Rescind 1013; Voluntary   FOLLOW-UP: Will sign off.  Thank you for the consult.  Please contact with any questions and/or concerns.  Case staffed with Dr. Ivar Miller.    Electronically signed by Ivar JINNY Miller, MD at 10/10/2023  5:45 AM PDT

## (undated) NOTE — ED Notes (Signed)
 Formatting of this note might be different from the original.  Patient left ED with his friend Mr Sharilyn Dasie Rosalva Vinson (Licence [id number])  Electronically signed by Christopher Castle, RN at 11/21/2023  5:45 PM PDT

## (undated) DEATH — deceased

---

## 2008-11-01 ENCOUNTER — Ambulatory Visit (EMERGENCY_DEPARTMENT_HOSPITAL): Payer: Self-pay

## 2008-11-12 ENCOUNTER — Ambulatory Visit (EMERGENCY_DEPARTMENT_HOSPITAL): Payer: Medicaid Other

## 2008-11-12 DIAGNOSIS — F3289 Other specified depressive episodes: Secondary | ICD-10-CM

## 2008-11-12 DIAGNOSIS — F329 Major depressive disorder, single episode, unspecified: Secondary | ICD-10-CM

## 2008-11-12 DIAGNOSIS — F4310 Post-traumatic stress disorder, unspecified: Secondary | ICD-10-CM

## 2008-11-13 ENCOUNTER — Ambulatory Visit (HOSPITAL_BASED_OUTPATIENT_CLINIC_OR_DEPARTMENT_OTHER): Payer: Self-pay | Admitting: Psychiatry

## 2008-11-13 ENCOUNTER — Encounter (HOSPITAL_BASED_OUTPATIENT_CLINIC_OR_DEPARTMENT_OTHER): Payer: Self-pay | Admitting: Mental Health

## 2008-11-25 ENCOUNTER — Emergency Department (HOSPITAL_BASED_OUTPATIENT_CLINIC_OR_DEPARTMENT_OTHER)
Admission: EM | Admit: 2008-11-25 | Discharge: 2008-11-26 | Disposition: A | Discharge status: Home / Self Care | Payer: Self-pay | Attending: Psychiatry | Admitting: Psychiatry

## 2008-11-25 DIAGNOSIS — F329 Major depressive disorder, single episode, unspecified: Secondary | ICD-10-CM | POA: Insufficient documentation

## 2008-11-25 DIAGNOSIS — F39 Unspecified mood [affective] disorder: Secondary | ICD-10-CM | POA: Insufficient documentation

## 2008-11-25 DIAGNOSIS — F4310 Post-traumatic stress disorder, unspecified: Secondary | ICD-10-CM | POA: Insufficient documentation

## 2008-11-25 DIAGNOSIS — F3289 Other specified depressive episodes: Secondary | ICD-10-CM | POA: Insufficient documentation

## 2009-08-20 ENCOUNTER — Ambulatory Visit (HOSPITAL_BASED_OUTPATIENT_CLINIC_OR_DEPARTMENT_OTHER): Payer: Self-pay | Admitting: Counselor

## 2011-03-07 ENCOUNTER — Emergency Department (HOSPITAL_BASED_OUTPATIENT_CLINIC_OR_DEPARTMENT_OTHER)
Admission: EM | Admit: 2011-03-07 | Discharge: 2011-03-07 | Disposition: A | Discharge status: Home / Self Care | Payer: MEDICAID | Attending: Registered Nurse | Admitting: Registered Nurse

## 2011-03-07 DIAGNOSIS — B353 Tinea pedis: Secondary | ICD-10-CM | POA: Insufficient documentation

## 2011-03-07 DIAGNOSIS — K219 Gastro-esophageal reflux disease without esophagitis: Secondary | ICD-10-CM

## 2011-04-21 ENCOUNTER — Other Ambulatory Visit (EMERGENCY_DEPARTMENT_HOSPITAL): Payer: Self-pay

## 2011-04-21 ENCOUNTER — Other Ambulatory Visit (EMERGENCY_DEPARTMENT_HOSPITAL): Payer: Self-pay | Admitting: Adult Reconstructive Orthopaedic Surgery

## 2011-04-21 ENCOUNTER — Emergency Department (HOSPITAL_BASED_OUTPATIENT_CLINIC_OR_DEPARTMENT_OTHER)
Admission: EM | Admit: 2011-04-21 | Discharge: 2011-04-21 | Disposition: A | Discharge status: Home / Self Care | Payer: Self-pay | Attending: Emergency Medicine | Admitting: Emergency Medicine

## 2011-04-21 DIAGNOSIS — B353 Tinea pedis: Secondary | ICD-10-CM

## 2011-04-21 LAB — COMPREHENSIVE METABOLIC PANEL
ALT (GPT): 19 U/L (ref 10–64)
AST (GOT): 23 U/L (ref 15–40)
Albumin: 4.1 g/dL (ref 3.5–5.2)
Alkaline Phosphatase (Total): 71 U/L (ref 35–109)
Anion Gap: 9 (ref 3–11)
Bilirubin (Total): 1 mg/dL (ref 0.2–1.3)
Calcium: 9.1 mg/dL (ref 8.9–10.2)
Carbon Dioxide, Total: 28 mEq/L (ref 22–32)
Chloride: 101 mEq/L (ref 98–108)
Creatinine: 0.71 mg/dL (ref 0.51–1.18)
GFR, Calc, African American: >60 mL/min (ref >59)
GFR, Calc, European American: >60 mL/min (ref >59)
Glucose: 102 mg/dL (ref 62–125)
Potassium: 3.6 mEq/L — ABNORMAL LOW (ref 3.7–5.2)
Protein (Total): 7.1 g/dL (ref 6.0–8.2)
Sodium: 138 mEq/L (ref 136–145)
Urea Nitrogen: 10 mg/dL (ref 8–21)

## 2011-04-21 LAB — 1ST EXTRA BLUE TOP

## 2011-04-21 LAB — 1ST EXTRA LAVENDER TOP

## 2011-05-11 ENCOUNTER — Emergency Department (HOSPITAL_BASED_OUTPATIENT_CLINIC_OR_DEPARTMENT_OTHER)
Admission: EM | Admit: 2011-05-11 | Discharge: 2011-05-11 | Disposition: A | Discharge status: Home / Self Care | Payer: Self-pay | Attending: Family | Admitting: Family

## 2011-05-11 DIAGNOSIS — L259 Unspecified contact dermatitis, unspecified cause: Secondary | ICD-10-CM

## 2011-05-31 ENCOUNTER — Emergency Department (HOSPITAL_BASED_OUTPATIENT_CLINIC_OR_DEPARTMENT_OTHER)
Admission: EM | Admit: 2011-05-31 | Discharge: 2011-05-31 | Disposition: A | Discharge status: Home / Self Care | Payer: Self-pay | Attending: Physician Assistant | Admitting: Physician Assistant

## 2011-05-31 DIAGNOSIS — J309 Allergic rhinitis, unspecified: Secondary | ICD-10-CM | POA: Insufficient documentation

## 2016-04-13 ENCOUNTER — Ambulatory Visit (HOSPITAL_BASED_OUTPATIENT_CLINIC_OR_DEPARTMENT_OTHER): Payer: Self-pay | Admitting: Nursing

## 2016-04-27 ENCOUNTER — Ambulatory Visit (HOSPITAL_BASED_OUTPATIENT_CLINIC_OR_DEPARTMENT_OTHER): Payer: Self-pay | Admitting: Nursing

## 2017-06-09 ENCOUNTER — Emergency Department (HOSPITAL_BASED_OUTPATIENT_CLINIC_OR_DEPARTMENT_OTHER)
Admission: EM | Admit: 2017-06-09 | Discharge: 2017-06-10 | Disposition: A | Discharge status: Home / Self Care | Payer: 59 | Attending: Emergency Medicine | Admitting: Emergency Medicine

## 2017-06-09 DIAGNOSIS — R45851 Suicidal ideations: Secondary | ICD-10-CM | POA: Insufficient documentation

## 2017-06-09 DIAGNOSIS — F1010 Alcohol abuse, uncomplicated: Secondary | ICD-10-CM | POA: Insufficient documentation

## 2017-06-09 LAB — BASIC METABOLIC PANEL
Anion Gap: 13 — ABNORMAL HIGH (ref 4–12)
Calcium: 9 mg/dL (ref 8.9–10.2)
Carbon Dioxide, Total: 24 meq/L (ref 22–32)
Chloride: 102 meq/L (ref 98–108)
Creatinine: 0.81 mg/dL (ref 0.51–1.18)
GFR, Calc, African American: >60 mL/min/{1.73_m2} (ref >59)
GFR, Calc, European American: >60 mL/min/{1.73_m2} (ref >59)
Glucose: 88 mg/dL (ref 62–125)
Potassium: 3.8 meq/L (ref 3.6–5.2)
Sodium: 139 meq/L (ref 135–145)
Urea Nitrogen: 9 mg/dL (ref 8–21)

## 2017-06-09 LAB — CBC (HEMOGRAM)
Hematocrit: 46 % (ref 38–50)
Hemoglobin: 16.4 g/dL (ref 13.0–18.0)
MCH: 33.5 pg (ref 27.3–33.6)
MCHC: 35.5 g/dL (ref 32.2–36.5)
MCV: 94 fL (ref 81–98)
Platelet Count: 250 10*3/uL (ref 150–400)
RBC: 4.9 10*6/uL (ref 4.40–5.60)
RDW-CV: 12.4 % (ref 11.6–14.4)
WBC: 8.1 10*3/uL (ref 4.30–10.00)

## 2017-06-09 LAB — STANDARD DRUG SCREEN, URN
Acetaminophen Qualitative, URN: NEGATIVE
Alcohol (Ethyl), URN: 103 mg/dL — AB
Amphet/Methamphetamine Qual,URN: NEGATIVE
Barbiturate (Qual), URN: NEGATIVE
Benzodiazepines (Qual), URN: NEGATIVE
Cannabinoids (Qual), URN: NEGATIVE
Cocaine (Qual), URN: NEGATIVE
Methadone (Qual), URN: NEGATIVE
Opiates (Qual), URN: NEGATIVE
Phencyclidine (Qual), URN: NEGATIVE
Tricyclic Antidepressants, URN: NEGATIVE

## 2017-06-09 LAB — ALCOHOL (ETHYL): Alcohol (Ethyl): 69 mg/dL — AB

## 2017-06-10 LAB — URINALYSIS WITH REFLEX CULTURE
Bacteria, URN: NONE SEEN
Bilirubin (Qual), URN: NEGATIVE
Epith Cells_Renal/Trans,URN: NEGATIVE /HPF
Epith Cells_Squamous, URN: NEGATIVE /LPF
Glucose Qual, URN: NEGATIVE mg/dL
Ketones, URN: NEGATIVE mg/dL
Nitrite, URN: NEGATIVE
Occult Blood, URN: NEGATIVE
Protein (Alb Semiquant), URN: NEGATIVE mg/dL
RBC, URN: NEGATIVE /HPF
Specific Gravity, URN: 1.009 g/mL (ref 1.006–1.027)
WBC, URN: NEGATIVE /HPF
pH, URN: 5.5 (ref 5.0–8.0)

## 2017-06-10 LAB — REFLEX CULTURE FOR UA

## 2017-06-11 ENCOUNTER — Emergency Department
Admission: EM | Admit: 2017-06-11 | Discharge: 2017-06-11 | Disposition: A | Discharge status: Home / Self Care | Payer: 59 | Attending: Emergency Medicine | Admitting: Emergency Medicine

## 2017-06-11 DIAGNOSIS — F329 Major depressive disorder, single episode, unspecified: Secondary | ICD-10-CM | POA: Insufficient documentation

## 2017-06-11 DIAGNOSIS — Z79899 Other long term (current) drug therapy: Secondary | ICD-10-CM | POA: Insufficient documentation

## 2017-06-11 DIAGNOSIS — R45851 Suicidal ideations: Secondary | ICD-10-CM | POA: Insufficient documentation

## 2017-06-11 DIAGNOSIS — Z888 Allergy status to other drugs, medicaments and biological substances status: Secondary | ICD-10-CM | POA: Insufficient documentation

## 2017-06-11 DIAGNOSIS — F1010 Alcohol abuse, uncomplicated: Secondary | ICD-10-CM | POA: Insufficient documentation

## 2017-06-11 DIAGNOSIS — Y909 Presence of alcohol in blood, level not specified: Secondary | ICD-10-CM | POA: Insufficient documentation

## 2017-06-11 LAB — CBC, DIFF
% Basophils: 1 %
% Eosinophils: 1 %
% Immature Granulocytes: 0 %
% Lymphocytes: 27 %
% Monocytes: 9 %
% Neutrophils: 62 %
% Nucleated RBC: 0 %
Absolute Eosinophil Count: 0.04 10*3/uL (ref 0.00–0.50)
Absolute Lymphocyte Count: 2.11 10*3/uL (ref 1.00–4.80)
Basophils: 0.04 10*3/uL (ref 0.00–0.20)
Hematocrit: 45 % (ref 38–50)
Hemoglobin: 15.7 g/dL (ref 13.0–18.0)
Immature Granulocytes: 0.01 10*3/uL (ref 0.00–0.05)
MCH: 34.2 pg — ABNORMAL HIGH (ref 27.3–33.6)
MCHC: 34.7 g/dL (ref 32.2–36.5)
MCV: 99 fL — ABNORMAL HIGH (ref 81–98)
Monocytes: 0.7 10*3/uL (ref 0.00–0.80)
Neutrophils: 4.95 10*3/uL (ref 1.80–7.00)
Nucleated RBC: 0 10*3/uL
Platelet Count: 244 10*3/uL (ref 150–400)
RBC: 4.59 10*6/uL (ref 4.40–5.60)
RDW-CV: 12.5 % (ref 11.6–14.4)
WBC: 7.85 10*3/uL (ref 4.3–10.0)

## 2017-06-11 LAB — COMPREHENSIVE METABOLIC PANEL
ALT (GPT): 31 U/L (ref 10–64)
AST (GOT): 22 U/L (ref 9–38)
Albumin: 4.3 g/dL (ref 3.5–5.2)
Alkaline Phosphatase (Total): 88 U/L (ref 36–122)
Anion Gap: 8 (ref 4–12)
Bilirubin (Total): 0.6 mg/dL (ref 0.2–1.3)
Calcium: 9.2 mg/dL (ref 8.9–10.2)
Carbon Dioxide, Total: 30 meq/L (ref 22–32)
Chloride: 101 meq/L (ref 98–108)
Creatinine: 0.8 mg/dL (ref 0.51–1.18)
GFR, Calc, African American: >60 mL/min/{1.73_m2} (ref >59)
GFR, Calc, European American: >60 mL/min/{1.73_m2} (ref >59)
Glucose: 82 mg/dL (ref 62–125)
Potassium: 4 meq/L (ref 3.6–5.2)
Protein (Total): 7.3 g/dL (ref 6.0–8.2)
Sodium: 139 meq/L (ref 135–145)
Urea Nitrogen: 15 mg/dL (ref 8–21)

## 2017-06-11 LAB — URINALYSIS WITH REFLEX CULTURE
Bacteria, URN: NONE SEEN
Bilirubin (Qual), URN: NEGATIVE
Epith Cells_Renal/Trans,URN: NEGATIVE /HPF
Epith Cells_Squamous, URN: NEGATIVE /LPF
Glucose Qual, URN: NEGATIVE mg/dL
Ketones, URN: NEGATIVE mg/dL
Leukocyte Esterase, URN: NEGATIVE
Nitrite, URN: NEGATIVE
Occult Blood, URN: NEGATIVE
Protein (Alb Semiquant), URN: NEGATIVE mg/dL
RBC, URN: NEGATIVE /HPF
Specific Gravity, URN: 1.015 g/mL (ref 1.006–1.027)
WBC, URN: NEGATIVE /HPF
pH, URN: 7 (ref 5.0–8.0)

## 2017-06-11 LAB — STANDARD DRUG SCREEN, URN
Acetaminophen Qualitative, URN: NEGATIVE
Alcohol (Ethyl), URN: NEGATIVE mg/dL
Amphet/Methamphetamine Qual,URN: NEGATIVE
Barbiturate (Qual), URN: NEGATIVE
Benzodiazepines (Qual), URN: NEGATIVE
Cannabinoids (Qual), URN: NEGATIVE
Cocaine (Qual), URN: NEGATIVE
Methadone (Qual), URN: NEGATIVE
Opiates (Qual), URN: NEGATIVE
Phencyclidine (Qual), URN: NEGATIVE
Tricyclic Antidepressants, URN: NEGATIVE

## 2017-06-11 LAB — URINE C/S: Culture: 8000

## 2017-06-24 ENCOUNTER — Encounter (HOSPITAL_BASED_OUTPATIENT_CLINIC_OR_DEPARTMENT_OTHER): Payer: 59 | Admitting: Adult Health

## 2017-06-25 ENCOUNTER — Emergency Department (HOSPITAL_BASED_OUTPATIENT_CLINIC_OR_DEPARTMENT_OTHER)
Admission: EM | Admit: 2017-06-25 | Discharge: 2017-06-25 | Disposition: A | Discharge status: Home / Self Care | Payer: 59 | Attending: Emergency Medicine | Admitting: Emergency Medicine

## 2017-06-25 ENCOUNTER — Other Ambulatory Visit: Payer: Self-pay

## 2017-06-25 DIAGNOSIS — R079 Chest pain, unspecified: Secondary | ICD-10-CM

## 2017-06-27 LAB — EKG 12 LEAD
Atrial Rate: 65 {beats}/min
Diagnosis: NORMAL
P Axis: 41 degrees
P-R Interval: 140 ms
Q-T Interval: 378 ms
QRS Duration: 102 ms
QTC Calculation: 393 ms
R Axis: 64 degrees
T Axis: 59 degrees
Ventricular Rate: 65 {beats}/min

## 2017-07-13 ENCOUNTER — Other Ambulatory Visit: Payer: Self-pay | Admitting: Emergency Medicine

## 2017-07-13 ENCOUNTER — Emergency Department
Admission: EM | Admit: 2017-07-13 | Discharge: 2017-07-13 | Disposition: A | Discharge status: Home / Self Care | Payer: 59 | Attending: Emergency Medicine | Admitting: Emergency Medicine

## 2017-07-13 DIAGNOSIS — K2900 Acute gastritis without bleeding: Secondary | ICD-10-CM | POA: Insufficient documentation

## 2017-07-13 DIAGNOSIS — X58XXXA Exposure to other specified factors, initial encounter: Secondary | ICD-10-CM | POA: Insufficient documentation

## 2017-07-13 DIAGNOSIS — Y903 Blood alcohol level of 60-79 mg/100 ml: Secondary | ICD-10-CM | POA: Insufficient documentation

## 2017-07-13 DIAGNOSIS — Z888 Allergy status to other drugs, medicaments and biological substances status: Secondary | ICD-10-CM | POA: Insufficient documentation

## 2017-07-13 DIAGNOSIS — R000 Tachycardia, unspecified: Secondary | ICD-10-CM

## 2017-07-13 DIAGNOSIS — F10239 Alcohol dependence with withdrawal, unspecified: Secondary | ICD-10-CM | POA: Insufficient documentation

## 2017-07-13 DIAGNOSIS — S0081XA Abrasion of other part of head, initial encounter: Secondary | ICD-10-CM | POA: Insufficient documentation

## 2017-07-13 DIAGNOSIS — R51 Headache: Secondary | ICD-10-CM | POA: Insufficient documentation

## 2017-07-13 LAB — CBC, DIFF
% Basophils: 0 %
% Eosinophils: 0 %
% Immature Granulocytes: 0 %
% Lymphocytes: 9 %
% Monocytes: 3 %
% Neutrophils: 88 %
% Nucleated RBC: 0 %
Absolute Eosinophil Count: 0 10*3/uL (ref 0.00–0.50)
Absolute Lymphocyte Count: 1.4 10*3/uL (ref 1.00–4.80)
Basophils: 0.04 10*3/uL (ref 0.00–0.20)
Hematocrit: 50 % (ref 38–50)
Hemoglobin: 17 g/dL (ref 13.0–18.0)
Immature Granulocytes: 0.05 10*3/uL (ref 0.00–0.05)
MCH: 33.5 pg (ref 27.3–33.6)
MCHC: 33.9 g/dL (ref 32.2–36.5)
MCV: 99 fL — ABNORMAL HIGH (ref 81–98)
Monocytes: 0.44 10*3/uL (ref 0.00–0.80)
Neutrophils: 13.34 10*3/uL — ABNORMAL HIGH (ref 1.80–7.00)
Nucleated RBC: 0 10*3/uL
Platelet Count: 270 10*3/uL (ref 150–400)
RBC: 5.08 10*6/uL (ref 4.40–5.60)
RDW-CV: 12.5 % (ref 11.6–14.4)
WBC: 15.27 10*3/uL — ABNORMAL HIGH (ref 4.3–10.0)

## 2017-07-13 LAB — LIPASE: Lipase: 16 U/L (ref <70)

## 2017-07-13 LAB — INFLUENZA A, B AND RSV, RAPID PCR
Rapid Influenza A PCR Result: NEGATIVE
Rapid Influenza B PCR Result: NEGATIVE
Rapid RSV PCR Result: NEGATIVE

## 2017-07-13 LAB — EKG 12 LEAD
Atrial Rate: 100 {beats}/min
Diagnosis: NORMAL
P Axis: 30 degrees
P-R Interval: 148 ms
Q-T Interval: 360 ms
QRS Duration: 100 ms
QTC Calculation: 464 ms
R Axis: 62 degrees
T Axis: 18 degrees
Ventricular Rate: 100 {beats}/min

## 2017-07-13 LAB — TROPONIN_I
Troponin_I Interpretation: NORMAL
Troponin_I: <0.03 ng/mL (ref <0.04)

## 2017-07-13 LAB — MAGNESIUM: Magnesium: 2.2 mg/dL (ref 1.8–2.4)

## 2017-07-13 LAB — COMPREHENSIVE METABOLIC PANEL
ALT (GPT): 25 U/L (ref 10–64)
AST (GOT): 21 U/L (ref 9–38)
Albumin: 5 g/dL (ref 3.5–5.2)
Alkaline Phosphatase (Total): 85 U/L (ref 36–122)
Anion Gap: 17 — ABNORMAL HIGH (ref 4–12)
Bilirubin (Total): 0.6 mg/dL (ref 0.2–1.3)
Calcium: 9.1 mg/dL (ref 8.9–10.2)
Carbon Dioxide, Total: 26 meq/L (ref 22–32)
Chloride: 102 meq/L (ref 98–108)
Creatinine: 0.92 mg/dL (ref 0.51–1.18)
GFR, Calc, African American: >60 mL/min/{1.73_m2} (ref >59)
GFR, Calc, European American: >60 mL/min/{1.73_m2} (ref >59)
Glucose: 81 mg/dL (ref 62–125)
Potassium: 3.8 meq/L (ref 3.6–5.2)
Protein (Total): 8.3 g/dL — ABNORMAL HIGH (ref 6.0–8.2)
Sodium: 145 meq/L (ref 135–145)
Urea Nitrogen: 17 mg/dL (ref 8–21)

## 2017-07-13 LAB — ALCOHOL (ETHYL): Alcohol (Ethyl): 75 mg/dL — AB

## 2017-08-02 DIAGNOSIS — F329 Major depressive disorder, single episode, unspecified: Secondary | ICD-10-CM

## 2017-08-02 DIAGNOSIS — K219 Gastro-esophageal reflux disease without esophagitis: Secondary | ICD-10-CM

## 2017-08-02 DIAGNOSIS — R1012 Left upper quadrant pain: Secondary | ICD-10-CM

## 2017-08-02 DIAGNOSIS — R1031 Right lower quadrant pain: Secondary | ICD-10-CM

## 2017-08-02 DIAGNOSIS — Z590 Homelessness: Secondary | ICD-10-CM

## 2017-08-02 DIAGNOSIS — R112 Nausea with vomiting, unspecified: Secondary | ICD-10-CM

## 2017-08-03 ENCOUNTER — Other Ambulatory Visit: Payer: Self-pay

## 2017-08-03 ENCOUNTER — Emergency Department (HOSPITAL_BASED_OUTPATIENT_CLINIC_OR_DEPARTMENT_OTHER)
Admission: EM | Admit: 2017-08-03 | Discharge: 2017-08-03 | Disposition: A | Discharge status: Home / Self Care | Payer: 59 | Attending: Emergency Medicine | Admitting: Emergency Medicine

## 2017-08-03 DIAGNOSIS — F329 Major depressive disorder, single episode, unspecified: Secondary | ICD-10-CM | POA: Insufficient documentation

## 2017-08-03 DIAGNOSIS — R112 Nausea with vomiting, unspecified: Secondary | ICD-10-CM | POA: Insufficient documentation

## 2017-08-03 DIAGNOSIS — Z590 Homelessness: Secondary | ICD-10-CM | POA: Insufficient documentation

## 2017-08-03 DIAGNOSIS — K219 Gastro-esophageal reflux disease without esophagitis: Secondary | ICD-10-CM | POA: Insufficient documentation

## 2017-08-03 DIAGNOSIS — R1031 Right lower quadrant pain: Secondary | ICD-10-CM | POA: Insufficient documentation

## 2017-08-03 DIAGNOSIS — R110 Nausea: Secondary | ICD-10-CM

## 2017-08-03 LAB — EKG 12 LEAD
Atrial Rate: 73 {beats}/min
Diagnosis: NORMAL
P Axis: 43 degrees
P-R Interval: 144 ms
Q-T Interval: 390 ms
QRS Duration: 100 ms
QTC Calculation: 429 ms
R Axis: 56 degrees
T Axis: 47 degrees
Ventricular Rate: 73 {beats}/min

## 2017-08-03 LAB — COMPREHENSIVE METABOLIC PANEL
ALT (GPT): 24 U/L (ref 10–64)
AST (GOT): 19 U/L (ref 9–38)
Albumin: 4.5 g/dL (ref 3.5–5.2)
Alkaline Phosphatase (Total): 69 U/L (ref 36–122)
Anion Gap: 10 (ref 4–12)
Bilirubin (Total): 0.5 mg/dL (ref 0.2–1.3)
Calcium: 9.2 mg/dL (ref 8.9–10.2)
Carbon Dioxide, Total: 29 meq/L (ref 22–32)
Chloride: 99 meq/L (ref 98–108)
Creatinine: 0.82 mg/dL (ref 0.51–1.18)
GFR, Calc, African American: >60 mL/min/{1.73_m2} (ref >59)
GFR, Calc, European American: >60 mL/min/{1.73_m2} (ref >59)
Glucose: 104 mg/dL (ref 62–125)
Potassium: 4.3 meq/L (ref 3.6–5.2)
Protein (Total): 7.5 g/dL (ref 6.0–8.2)
Sodium: 138 meq/L (ref 135–145)
Urea Nitrogen: 15 mg/dL (ref 8–21)

## 2017-08-03 LAB — CBC (HEMOGRAM)
Hematocrit: 46 % (ref 38–50)
Hemoglobin: 16.4 g/dL (ref 13.0–18.0)
MCH: 33.7 pg — ABNORMAL HIGH (ref 27.3–33.6)
MCHC: 35.5 g/dL (ref 32.2–36.5)
MCV: 95 fL (ref 81–98)
Platelet Count: 251 10*3/uL (ref 150–400)
RBC: 4.86 10*6/uL (ref 4.40–5.60)
RDW-CV: 12.2 % (ref 11.6–14.4)
WBC: 12.87 10*3/uL — ABNORMAL HIGH (ref 4.30–10.00)

## 2017-08-03 LAB — PROTHROMBIN & PTT
Partial Thromboplastin Time: 23 s (ref 22–35)
Prothrombin INR: 1 (ref 0.8–1.3)
Prothrombin Time Patient: 13.1 s (ref 10.7–15.6)

## 2017-08-03 LAB — LAB ADD ON ORDER

## 2017-08-03 LAB — LIPASE: Lipase: 17 U/L (ref <70)

## 2017-08-03 LAB — MAGNESIUM: Magnesium: 2.1 mg/dL (ref 1.8–2.4)

## 2017-09-04 ENCOUNTER — Inpatient Hospital Stay: Payer: Self-pay

## 2017-09-28 ENCOUNTER — Emergency Department
Admission: EM | Admit: 2017-09-28 | Discharge: 2017-09-28 | Disposition: A | Discharge status: Home / Self Care | Payer: 59 | Attending: Emergency Medicine | Admitting: Emergency Medicine

## 2017-09-28 ENCOUNTER — Other Ambulatory Visit: Payer: Self-pay | Admitting: Emergency Medicine

## 2017-09-28 DIAGNOSIS — R072 Precordial pain: Secondary | ICD-10-CM | POA: Insufficient documentation

## 2017-09-28 DIAGNOSIS — R079 Chest pain, unspecified: Secondary | ICD-10-CM | POA: Insufficient documentation

## 2017-09-28 DIAGNOSIS — Z79899 Other long term (current) drug therapy: Secondary | ICD-10-CM | POA: Insufficient documentation

## 2017-09-28 DIAGNOSIS — F10129 Alcohol abuse with intoxication, unspecified: Secondary | ICD-10-CM | POA: Insufficient documentation

## 2017-09-28 DIAGNOSIS — Z888 Allergy status to other drugs, medicaments and biological substances status: Secondary | ICD-10-CM | POA: Insufficient documentation

## 2017-09-28 LAB — STANDARD DRUG SCREEN, URN
Acetaminophen Qualitative, URN: NEGATIVE
Alcohol (Ethyl), URN: 207 mg/dL — AB
Amphet/Methamphetamine Qual,URN: NEGATIVE
Barbiturate (Qual), URN: NEGATIVE
Benzodiazepines (Qual), URN: NEGATIVE
Cannabinoids (Qual), URN: NEGATIVE
Cocaine (Qual), URN: NEGATIVE
Methadone (Qual), URN: NEGATIVE
Opiates (Qual), URN: NEGATIVE
Phencyclidine (Qual), URN: NEGATIVE
Tricyclic Antidepressants, URN: NEGATIVE

## 2017-09-28 LAB — COMPREHENSIVE METABOLIC PANEL
ALT (GPT): 29 U/L (ref 10–64)
AST (GOT): 23 U/L (ref 9–38)
Albumin: 4.5 g/dL (ref 3.5–5.2)
Alkaline Phosphatase (Total): 68 U/L (ref 36–122)
Anion Gap: 13 — ABNORMAL HIGH (ref 4–12)
Bilirubin (Total): 0.4 mg/dL (ref 0.2–1.3)
Calcium: 9.3 mg/dL (ref 8.9–10.2)
Carbon Dioxide, Total: 28 meq/L (ref 22–32)
Chloride: 99 meq/L (ref 98–108)
Creatinine: 1.05 mg/dL (ref 0.51–1.18)
GFR, Calc, African American: >60 mL/min/{1.73_m2} (ref >59)
GFR, Calc, European American: >60 mL/min/{1.73_m2} (ref >59)
Glucose: 90 mg/dL (ref 62–125)
Potassium: 4.2 meq/L (ref 3.6–5.2)
Protein (Total): 7.6 g/dL (ref 6.0–8.2)
Sodium: 140 meq/L (ref 135–145)
Urea Nitrogen: 13 mg/dL (ref 8–21)

## 2017-09-28 LAB — CBC, DIFF
% Basophils: 0 %
% Eosinophils: 0 %
% Immature Granulocytes: 0 %
% Lymphocytes: 38 %
% Monocytes: 9 %
% Neutrophils: 53 %
% Nucleated RBC: 0 %
Absolute Eosinophil Count: 0.02 10*3/uL (ref 0.00–0.50)
Absolute Lymphocyte Count: 2.69 10*3/uL (ref 1.00–4.80)
Basophils: 0.03 10*3/uL (ref 0.00–0.20)
Hematocrit: 46 % (ref 38–50)
Hemoglobin: 15.4 g/dL (ref 13.0–18.0)
Immature Granulocytes: 0.01 10*3/uL (ref 0.00–0.05)
MCH: 32.4 pg (ref 27.3–33.6)
MCHC: 33.3 g/dL (ref 32.2–36.5)
MCV: 97 fL (ref 81–98)
Monocytes: 0.65 10*3/uL (ref 0.00–0.80)
Neutrophils: 3.61 10*3/uL (ref 1.80–7.00)
Nucleated RBC: 0 10*3/uL
Platelet Count: 257 10*3/uL (ref 150–400)
RBC: 4.75 10*6/uL (ref 4.40–5.60)
RDW-CV: 12.6 % (ref 11.6–14.4)
WBC: 7.01 10*3/uL (ref 4.3–10.0)

## 2017-09-28 LAB — URINALYSIS WITH REFLEX CULTURE
Bacteria, URN: NONE SEEN
Bilirubin (Qual), URN: NEGATIVE
Epith Cells_Renal/Trans,URN: NEGATIVE /HPF
Epith Cells_Squamous, URN: NEGATIVE /LPF
Glucose Qual, URN: NEGATIVE mg/dL
Ketones, URN: NEGATIVE mg/dL
Leukocyte Esterase, URN: NEGATIVE
Nitrite, URN: NEGATIVE
Occult Blood, URN: NEGATIVE
Protein (Alb Semiquant), URN: NEGATIVE mg/dL
RBC, URN: NEGATIVE /HPF
Specific Gravity, URN: 1.02 g/mL (ref 1.006–1.027)
WBC, URN: NEGATIVE /HPF
pH, URN: 5.5 (ref 5.0–8.0)

## 2017-09-28 LAB — TROPONIN_I
Troponin_I Interpretation: NORMAL
Troponin_I Interpretation: NORMAL
Troponin_I: <0.03 ng/mL (ref <0.04)
Troponin_I: <0.03 ng/mL (ref <0.04)

## 2017-09-28 LAB — LAB ADD ON ORDER

## 2017-09-28 LAB — LIPASE: Lipase: 22 U/L (ref <70)

## 2017-09-29 ENCOUNTER — Inpatient Hospital Stay: Payer: Self-pay

## 2017-09-29 LAB — EKG 12 LEAD
Atrial Rate: 95 {beats}/min
Diagnosis: NORMAL
P Axis: 53 degrees
P-R Interval: 140 ms
Q-T Interval: 332 ms
QRS Duration: 94 ms
QTC Calculation: 417 ms
R Axis: 68 degrees
T Axis: 35 degrees
Ventricular Rate: 95 {beats}/min

## 2017-10-11 ENCOUNTER — Inpatient Hospital Stay: Payer: Self-pay

## 2017-11-24 ENCOUNTER — Inpatient Hospital Stay: Payer: Self-pay

## 2017-12-01 ENCOUNTER — Inpatient Hospital Stay: Payer: Self-pay

## 2018-01-16 ENCOUNTER — Inpatient Hospital Stay: Payer: Self-pay

## 2018-01-19 ENCOUNTER — Inpatient Hospital Stay: Payer: Self-pay

## 2018-03-08 ENCOUNTER — Inpatient Hospital Stay: Payer: Self-pay

## 2018-03-13 ENCOUNTER — Inpatient Hospital Stay: Payer: Self-pay

## 2018-03-14 ENCOUNTER — Emergency Department (HOSPITAL_BASED_OUTPATIENT_CLINIC_OR_DEPARTMENT_OTHER)
Admission: EM | Admit: 2018-03-14 | Discharge: 2018-03-14 | Disposition: A | Discharge status: Home / Self Care | Payer: Self-pay | Attending: Emergency Medicine | Admitting: Emergency Medicine

## 2018-03-14 DIAGNOSIS — Z8719 Personal history of other diseases of the digestive system: Secondary | ICD-10-CM | POA: Insufficient documentation

## 2018-03-14 DIAGNOSIS — Z590 Homelessness: Secondary | ICD-10-CM | POA: Insufficient documentation

## 2018-03-14 DIAGNOSIS — Z9889 Other specified postprocedural states: Secondary | ICD-10-CM | POA: Insufficient documentation

## 2018-03-14 DIAGNOSIS — Z79899 Other long term (current) drug therapy: Secondary | ICD-10-CM | POA: Insufficient documentation

## 2018-03-14 DIAGNOSIS — J101 Influenza due to other identified influenza virus with other respiratory manifestations: Secondary | ICD-10-CM | POA: Insufficient documentation

## 2018-03-14 LAB — INFLUENZA A, B AND RSV, RAPID PCR
Rapid Influenza A PCR Result: NEGATIVE
Rapid Influenza B PCR Result: NEGATIVE
Rapid RSV PCR Result: NEGATIVE

## 2018-03-18 ENCOUNTER — Inpatient Hospital Stay: Payer: Self-pay

## 2018-04-05 ENCOUNTER — Inpatient Hospital Stay: Payer: Self-pay

## 2018-04-06 ENCOUNTER — Inpatient Hospital Stay: Payer: Self-pay

## 2018-04-07 ENCOUNTER — Emergency Department (HOSPITAL_BASED_OUTPATIENT_CLINIC_OR_DEPARTMENT_OTHER)
Admission: EM | Admit: 2018-04-07 | Discharge: 2018-04-07 | Disposition: A | Discharge status: Home / Self Care | Payer: 59 | Attending: Emergency Medicine | Admitting: Emergency Medicine

## 2018-04-07 DIAGNOSIS — Z590 Homelessness: Secondary | ICD-10-CM | POA: Insufficient documentation

## 2018-04-07 DIAGNOSIS — G43909 Migraine, unspecified, not intractable, without status migrainosus: Secondary | ICD-10-CM | POA: Insufficient documentation

## 2018-05-08 ENCOUNTER — Inpatient Hospital Stay: Payer: Self-pay

## 2018-05-12 ENCOUNTER — Inpatient Hospital Stay: Payer: Self-pay

## 2018-05-25 ENCOUNTER — Inpatient Hospital Stay: Payer: Self-pay

## 2018-06-20 ENCOUNTER — Inpatient Hospital Stay: Payer: Self-pay

## 2018-07-01 ENCOUNTER — Emergency Department (HOSPITAL_BASED_OUTPATIENT_CLINIC_OR_DEPARTMENT_OTHER): Admission: EM | Admit: 2018-07-01 | Discharge: 2018-07-01 | Payer: 59

## 2018-07-01 ENCOUNTER — Inpatient Hospital Stay: Payer: Self-pay

## 2018-07-03 ENCOUNTER — Encounter (HOSPITAL_BASED_OUTPATIENT_CLINIC_OR_DEPARTMENT_OTHER): Payer: 59 | Admitting: Clinical

## 2018-07-03 ENCOUNTER — Encounter (HOSPITAL_BASED_OUTPATIENT_CLINIC_OR_DEPARTMENT_OTHER): Payer: Self-pay

## 2018-07-03 NOTE — Progress Notes (Signed)
Mr. Mustard did not cancel and was not present for a scheduled next day appointment today.  Disposition: Chart reviewed, patient contacted by phone regarding follow-up.  He said he forgot.  He said he is njot having significant SI and risk of harm is low.  Advised him to use ED appropriately for safety.  He can try calling [facility] to see if he can reschedule this NDA.

## 2018-08-04 ENCOUNTER — Inpatient Hospital Stay: Payer: Self-pay

## 2018-08-05 ENCOUNTER — Emergency Department
Admission: EM | Admit: 2018-08-05 | Discharge: 2018-08-06 | Disposition: A | Discharge status: Other Acute Inpatient Hospital | Payer: 59 | Attending: Emergency Medicine | Admitting: Emergency Medicine

## 2018-08-05 DIAGNOSIS — Z888 Allergy status to other drugs, medicaments and biological substances status: Secondary | ICD-10-CM | POA: Insufficient documentation

## 2018-08-05 DIAGNOSIS — F329 Major depressive disorder, single episode, unspecified: Secondary | ICD-10-CM | POA: Insufficient documentation

## 2018-08-05 DIAGNOSIS — Z79899 Other long term (current) drug therapy: Secondary | ICD-10-CM | POA: Insufficient documentation

## 2018-08-05 DIAGNOSIS — R45851 Suicidal ideations: Secondary | ICD-10-CM | POA: Insufficient documentation

## 2018-08-05 LAB — CBC, DIFF
% Basophils: 0 %
% Eosinophils: 1 %
% Immature Granulocytes: 0 %
% Lymphocytes: 34 %
% Monocytes: 9 %
% Neutrophils: 56 %
% Nucleated RBC: 0 %
Absolute Eosinophil Count: 0.07 10*3/uL (ref 0.00–0.50)
Absolute Lymphocyte Count: 2.11 10*3/uL (ref 1.00–4.80)
Basophils: 0.02 10*3/uL (ref 0.00–0.20)
Hematocrit: 44 % (ref 38–50)
Hemoglobin: 15.3 g/dL (ref 13.0–18.0)
Immature Granulocytes: 0.02 10*3/uL (ref 0.00–0.05)
MCH: 32.8 pg (ref 27.3–33.6)
MCHC: 34.5 g/dL (ref 32.2–36.5)
MCV: 95 fL (ref 81–98)
Monocytes: 0.58 10*3/uL (ref 0.00–0.80)
Neutrophils: 3.42 10*3/uL (ref 1.80–7.00)
Nucleated RBC: 0 10*3/uL
Platelet Count: 212 10*3/uL (ref 150–400)
RBC: 4.66 10*6/uL (ref 4.40–5.60)
RDW-CV: 12.5 % (ref 11.6–14.4)
WBC: 6.22 10*3/uL (ref 4.3–10.0)

## 2018-08-05 LAB — URINALYSIS WITH REFLEX CULTURE
Bacteria, URN: NONE SEEN
Bilirubin (Qual), URN: NEGATIVE
Epith Cells_Renal/Trans,URN: NEGATIVE /HPF
Epith Cells_Squamous, URN: NEGATIVE /LPF
Glucose Qual, URN: NEGATIVE mg/dL
Ketones, URN: NEGATIVE mg/dL
Leukocyte Esterase, URN: NEGATIVE
Nitrite, URN: NEGATIVE
Occult Blood, URN: NEGATIVE
Protein (Alb Semiquant), URN: NEGATIVE mg/dL
RBC, URN: NEGATIVE /HPF
Specific Gravity, URN: >1.029 g/mL — ABNORMAL HIGH (ref 1.006–1.027)
WBC, URN: NEGATIVE /HPF
pH, URN: 5.5 (ref 5.0–8.0)

## 2018-08-05 LAB — COMPREHENSIVE METABOLIC PANEL
ALT (GPT): 23 U/L (ref 10–64)
AST (GOT): 19 U/L (ref 9–38)
Albumin: 4.2 g/dL (ref 3.5–5.2)
Alkaline Phosphatase (Total): 66 U/L (ref 36–122)
Anion Gap: 7 (ref 4–12)
Bilirubin (Total): 0.4 mg/dL (ref 0.2–1.3)
Calcium: 8.7 mg/dL — ABNORMAL LOW (ref 8.9–10.2)
Carbon Dioxide, Total: 30 meq/L (ref 22–32)
Chloride: 103 meq/L (ref 98–108)
Creatinine: 0.86 mg/dL (ref 0.51–1.18)
GFR, Calc, African American: >60 mL/min/{1.73_m2} (ref >59)
GFR, Calc, European American: >60 mL/min/{1.73_m2} (ref >59)
Glucose: 85 mg/dL (ref 62–125)
Potassium: 4.1 meq/L (ref 3.6–5.2)
Protein (Total): 6.9 g/dL (ref 6.0–8.2)
Sodium: 140 meq/L (ref 135–145)
Urea Nitrogen: 17 mg/dL (ref 8–21)

## 2018-08-05 LAB — STANDARD DRUG SCREEN, URN
Acetaminophen Qualitative, URN: NEGATIVE
Alcohol (Ethyl), URN: NEGATIVE mg/dL
Amphet/Methamphetamine Qual,URN: NEGATIVE
Barbiturate (Qual), URN: NEGATIVE
Benzodiazepines (Qual), URN: NEGATIVE
Cannabinoids (Qual), URN: NEGATIVE
Cocaine (Qual), URN: NEGATIVE
Methadone (Qual), URN: NEGATIVE
Opiates (Qual), URN: NEGATIVE
Phencyclidine (Qual), URN: NEGATIVE
Tricyclic Antidepressants, URN: NEGATIVE

## 2018-08-05 LAB — ALCOHOL (ETHYL): Alcohol (Ethyl): NEGATIVE mg/dL

## 2018-12-19 ENCOUNTER — Inpatient Hospital Stay: Payer: Self-pay

## 2018-12-24 ENCOUNTER — Inpatient Hospital Stay: Payer: Self-pay

## 2018-12-24 NOTE — H&P (Signed)
[facility] Admission History & Physical      Gary [name].    Patient MR #  [id number] Patient CSN #  [id number] Admission date:  12/24/2018  DOB:  1980/07/03     PCP: Selected No PCP       SOURCE OF HISTORY: patient            CHIEF COMPLAINT:   Chief Complaint   Patient presents with   . Dizziness   . Weakness General          HISTORY OF PRESENT ILLNESS:  Gary Darga. is a 38 year old male who presents to the [facility] Emergency Department complaining of dizziness, possible throat swelling since given a shot of naltrexone yesterday.  Patient was just released from detox yesterday.  Patient started having some dizziness yesterday and this morning he felt that his throat was closing up.  Furthermore, patient denies any chest pain, palpitations, abdominal pain, N/V/F/C, URI sx's, cough, SOB, orthopnea, PND, BRBPR, melena, hematochezia, falls, trauma, or syncopal episodes.    Of note, patient believes his last drink was on Wednesday or Thursday.  He was in detox the last few days and [place]'ed this am.  He has no withdrawal sx's at current.      In the ER, patient had a negative CXR and currently has stable vitals.      PAST MEDICAL HISTORY:  Past Medical History:   Diagnosis Date   . Bipolar 1 disorder (CMS/HCC)        PAST SURGICAL HISTORY:  No past surgical history on file.    FAMILY HISTORY:  Reviewed and noncontributory to patient's current illness    SOCIAL HISTORY:  Social History     Socioeconomic History   . Marital status: Divorced     Spouse name: Not on file   . Number of children: Not on file   . Years of education: Not on file   . Highest education level: Not on file   Occupational History   . Not on file   Social Needs   . Financial resource strain: Not on file   . Food insecurity:     Worry: Not on file     Inability: Not on file   . Transportation needs:     Medical: Not on file     Non-medical: Not on file   Tobacco Use   . Smoking status: Never Smoker   .  Smokeless tobacco: Never Used   Substance and Sexual Activity   . Alcohol use: Yes     Alcohol/week: 30.0 standard drinks     Types: 30 Cans of beer per week     Comment: not since thurs but heavy drinker   . Drug use: No   . Sexual activity: Not on file   Lifestyle   . Physical activity:     Days per week: Not on file     Minutes per session: Not on file   . Stress: Not on file   Relationships   . Social connections:     Talks on phone: Not on file     Gets together: Not on file     Attends religious service: Not on file     Active member of club or organization: Not on file     Attends meetings of clubs or organizations: Not on file     Relationship status: Not on file   Other Topics Concern   .  Not on file   Social History Narrative   . Not on file       HOME MEDICATIONS (reviewed and reconciled):   Prior to Admission Medications   Prescriptions Last Dose Informant Patient Reported? Taking?   ibuprofen (MOTRIN) 200 MG Tab   Yes No   Sig: Take 200 mg by mouth every 6 hours as needed for pain.   omeprazole (PRILOSEC) 40 MG Capsule Delayed Release   Yes No   Sig: Take 40 mg by mouth once daily.      Facility-Administered Medications: None       ALLERGIES:  Patients documented allergies   Allergen Reactions   . Haldol [Haloperidol Lactate] Seizures   . Abilify [Aripiprazole] Vomiting   . Geodon [Ziprasidone Hcl]    . Lithium    . Effexor [Venlafaxine] Nausea/Vomiting       REVIEW OF SYSTEMS:   A comprehensive review of system involving all 14 systems does not reveal any other significant abnormality or symptomatology apart from those mentioned in the history of present illness.    PHYSICAL EXAMINATION:  BP 130/82  Pulse 82  Temp (Src) 97.6 (Oral)  Resp 18  Wt 167 lb (75.751 kg)  SaO2 96%    Constitutional: Well developed. Appears in no acute distress.  Head: Normocephalic. Atraumatic.  Eyes: Pupils are equal and reactive to light. Extraocular muscles intact. Anicteric sclera.  ENT/Mouth: Normal pharynx. Moist  mucous membranes.  Neck: Supple without lymphadenopathy. No jugular venous distension.  Cardiovascular: Regular rate and rhythm without murmurs, rubs, or gallops.   Respiratory: Clear to auscultation. No wheezing, rales, or rhonchi. No accessory muscle use.  Gastrointestinal: Soft, nontender, nondistended, normoactive bowel sounds. No organomegaly. No rebound or guarding.   Genitourinary: Deferred.  Skin: Warm and dry, no significant rashes.  Neurologic: Cranial nerves II through XII intact. No focal deficits.   Psychiatric: Alert and oriented X3, appropriate affect.      LABS:  I personally reviewed the laboratory studies including but not limited to:  Labs in the last 24 hours   O CBC WITH DIFF    Collection Time: 12/24/18 11:49 AM   Result Value Ref Range    WBC 6.91 4.00 - 12.00 K/uL    RBC 4.61 4.50 - 6.00 mil/uL    Hgb 15.9 14.0 - 18.0 g/dL    Hct 16.1 40 - 54 %    MCV 96.5 80 - 98 fL    MCH 34.5 (H) 27 - 33 pg    MCHC 35.7 32 - 37 g/dL    RDW 09.6 04.5 - 40.9 %    Plt 232 150 - 450 K/uL    Differential type Automated     Abs neuts 3.81 1.80 - 7.80 K/uL    Abs immature grans 0.02 0.00 - 0.07 K/uL    Abs lymphs 2.25 0.80 - 3.30 K/uL    Abs monos 0.74 0.10 - 1.00 K/uL    Abs eos 0.07 0.00 - 0.40 K/uL    Abs basos 0.02 0.00 - 0.20 K/uL    Abs NRBCs 0.00 0.00 K/uL    Neuts 55.1 %    Immature grans 0.3 0.0 - 0.9 %    Lymphs 32.6 %    Monos 10.7 %    Eos 1.0 %    Basos 0.3 %    NRBC 0.0 0.0 %   O COMPREHENSIVE METABOLIC PANEL    Collection Time: 12/24/18 11:49 AM   Result Value Ref Range  Na 142 135 - 145 mmol/L    K 4.1 3.6 - 5.3 mmol/L    Cl 108 98 - 109 mmol/L    CO2 24 21 - 28 mmol/L    Anion gap w/o K 10 7 - 15    BUN 9 8 - 24 mg/dL    Creatinine 1.61 0.7 - 1.5 mg/dL    GFR non African Amer 82 >59 mL/min    GFR African American 99 >59 mL/min    Glucose 91 65 - 120 mg/dL    SGOT/AST 27 5 - 40 IU/L    Alk Phos 64 38 - 126 IU/L    SGPT/ALT 32 6 - 60 IU/L    Bilirubin total 0.3 0.2 - 1.4 mg/dL    Protein 7.1 6.2  - 8.0 g/dL    Albumin 3.9 3.2 - 5.0 g/dL    Globulin (calc) 3.2 2.0 - 4.5 g/dL    A:G Ratio 1.2 >0.9    Calcium 8.8 8.5 - 10.5 mg/dL   O LIPASE    Collection Time: 12/24/18 11:49 AM   Result Value Ref Range    Lipase 26 8 - 78 IU/L   O DRUG SCREEN URINE ED    Collection Time: 12/24/18 11:49 AM   Result Value Ref Range    Amphetamine scr ur Negative            ( <1000 ng/mL)     Barbiturate scr ur Negative            (  <300 ng/mL)     Benzodiazepin scr ur **Positive**        ( >=300 ng/mL)     Cocaine metabol ur Negative            (  <300 ng/mL)     Ethanol scr urine Negative            (   <30 mg/dL)     Methadone scr urine Negative            (  <300 ng/mL)     Morph/Codeine ur QL Negative            (  <300 ng/mL)     Phencyclidine scr ur Negative            (   <25 ng/mL)     Cannabinoid scr ur Negative            (   <50 ng/mL)     Oxycodone ur Negative            ( <100 ng/mL)     Comment      O DRUGS OF ABUSE BLOOD    Collection Time: 12/24/18 11:49 AM   Result Value Ref Range    Acetaminophen <3.0 <25 ug/mL    Salicylate <5.0 2 - 20 mg/dL    Alcohol <60 <45 mg/dL   O TROPONIN I    Collection Time: 12/24/18 11:49 AM   Result Value Ref Range    Troponin I 0.000 0 - 0.028 ng/mL   O UA CULTURE IF INDICATED    Collection Time: 12/24/18 11:49 AM   Result Value Ref Range    Color Yellow     Appearance Clear     Specific gravity 1.011 1.003 - 1.030    pH urine 7.5 5.0 - 8.0    Protein Negative mg/dL    Glucose Negative mg/dL    Ketones Negative mg/dL    Bilirubin Negative  Occult blood Negative     Urobilinogen <2.0 <2.0 mg/dL    Leukocyte esterase Negative     Nitrite Negative     Comment Urine microscopic analysis not indicated     Culture Not indicated        DIAGNOSTICS:  I have personally reviewed and visualized all imaging studies:  O Xr Chest 1 View - Pa Or Ap    Result Date: 12/24/2018  EXAMINATION:  XR CHEST 1 VIEW - PA OR AP 12/24/2018 12:35 PM CLINICAL  HISTORY: 38 year old male with provided history of  "dizziness." DIAGNOSIS: TECHNIQUE:  A portable upright AP examination of the chest was performed at 12:35 PM on 12/24/2018.  One image is submitted. COMPARISON: Prior two-view chest examinations dated 11/02/2016 and 01/03/2014. FINDINGS:   Allowing for low lung volumes and portable AP technique, the cardiomediastinal silhouette and pulmonary vasculature are within normal limits.  There is no infiltrate, effusion or pneumothorax.  The visualized upper abdomen and bony structures are unremarkable.     IMPRESSION: Suboptimal inspiration.  No evidence of acute intrathoracic disease. ....... Providers: To speak with a Automotive engineer, call [phone].      EKG:  I have personally reviewed.  EKG reveals sinus, no acute ST-T wave changes.      Assessment/Plan:  Possible allergy to naltrexone.  At this time, patient is hemodynamically stable as well as clinically stable.  Will admit for observation.  Start on prednisone (solumedrol given in ER), pepcid, benadryl prn.  Clinical monitor.    Alcohol abuse and dependence:  Allergy to naltrexone.  No signs/symptoms of withdrawal at present.  If develops symptoms, will need to order CIWA/ativan.  SW consult.  GERD:  Continue on prilosec.    Hx of bipolar disorder/Depression  DVT Prophylaxis:  subcut heparin.      Code Status: Full code    EDD:   12/25/2018      BILLING NOTES:  CPT Code: 40347  Initial IP visit: 99221 [id number] [id number]  Refer to Observation: 99218 [id number]  Admit Discharge SAME DAY (IP/OBS): 99234 99235 99236  IP Consultation: 99251 [phone] [id number] [phone]    I certify that inpatient services for greater than two midnights are medically necessary for this patient. Please see MD progress notes for additional information about the patient's course of treatment.    CC:   Selected No PCP  Phone number None  Fax number None    Brenton Grills, MD  [facility]   Pager: [phone]

## 2018-12-25 ENCOUNTER — Inpatient Hospital Stay: Payer: Self-pay

## 2019-02-15 ENCOUNTER — Inpatient Hospital Stay: Payer: Self-pay

## 2019-02-21 ENCOUNTER — Inpatient Hospital Stay: Payer: Self-pay

## 2019-03-16 ENCOUNTER — Inpatient Hospital Stay: Payer: Self-pay

## 2019-03-24 ENCOUNTER — Inpatient Hospital Stay: Payer: Self-pay

## 2019-03-25 ENCOUNTER — Inpatient Hospital Stay: Payer: Self-pay

## 2019-05-09 ENCOUNTER — Inpatient Hospital Stay: Payer: Self-pay

## 2019-05-16 ENCOUNTER — Inpatient Hospital Stay: Payer: Self-pay

## 2019-09-14 ENCOUNTER — Emergency Department: Payer: Self-pay

## 2019-09-15 ENCOUNTER — Emergency Department: Payer: Self-pay

## 2019-09-16 ENCOUNTER — Emergency Department: Payer: Self-pay

## 2019-09-26 ENCOUNTER — Emergency Department: Payer: Self-pay

## 2019-09-27 ENCOUNTER — Emergency Department: Payer: Self-pay

## 2019-09-28 ENCOUNTER — Emergency Department: Payer: Self-pay

## 2019-11-14 ENCOUNTER — Emergency Department: Payer: Self-pay

## 2020-01-13 ENCOUNTER — Emergency Department: Payer: Self-pay

## 2020-01-26 ENCOUNTER — Emergency Department: Payer: Self-pay

## 2020-03-06 ENCOUNTER — Emergency Department: Payer: Self-pay

## 2020-06-29 ENCOUNTER — Emergency Department: Payer: Self-pay

## 2021-07-20 ENCOUNTER — Emergency Department (HOSPITAL_COMMUNITY)
Admission: EM | Admit: 2021-07-20 | Discharge: 2021-07-21 | Disposition: A | Discharge status: Home / Self Care | Payer: Self-pay | Attending: Emergency Medicine | Admitting: Emergency Medicine

## 2021-07-20 DIAGNOSIS — F32A Depression, unspecified: Secondary | ICD-10-CM | POA: Insufficient documentation

## 2021-07-20 DIAGNOSIS — Z59819 Housing instability, housed unspecified: Secondary | ICD-10-CM

## 2021-07-20 DIAGNOSIS — Z659 Problem related to unspecified psychosocial circumstances: Secondary | ICD-10-CM

## 2021-07-20 DIAGNOSIS — R519 Headache, unspecified: Secondary | ICD-10-CM | POA: Insufficient documentation

## 2021-07-20 DIAGNOSIS — R7989 Other specified abnormal findings of blood chemistry: Secondary | ICD-10-CM | POA: Insufficient documentation

## 2021-07-20 DIAGNOSIS — D649 Anemia, unspecified: Secondary | ICD-10-CM | POA: Insufficient documentation

## 2021-07-20 DIAGNOSIS — Z20822 Contact with and (suspected) exposure to covid-19: Secondary | ICD-10-CM | POA: Insufficient documentation

## 2021-07-20 DIAGNOSIS — N179 Acute kidney failure, unspecified: Secondary | ICD-10-CM | POA: Insufficient documentation

## 2021-07-20 DIAGNOSIS — Y900 Blood alcohol level of less than 20 mg/100 ml: Secondary | ICD-10-CM | POA: Insufficient documentation

## 2021-07-20 DIAGNOSIS — F1011 Alcohol abuse, in remission: Secondary | ICD-10-CM

## 2021-07-20 DIAGNOSIS — R45851 Suicidal ideations: Secondary | ICD-10-CM | POA: Insufficient documentation

## 2021-07-21 ENCOUNTER — Emergency Department (HOSPITAL_COMMUNITY): Payer: Self-pay

## 2021-07-21 ENCOUNTER — Other Ambulatory Visit: Payer: Self-pay

## 2021-07-21 ENCOUNTER — Encounter (HOSPITAL_COMMUNITY): Payer: Self-pay

## 2021-07-21 LAB — CBC WITH DIFFERENTIAL/PLATELET
Abs Immature Granulocytes: 0.03 10*3/uL (ref 0.00–0.07)
Basophils Absolute: 0.1 10*3/uL (ref 0.0–0.1)
Basophils Relative: 1 %
Eosinophils Absolute: 0.1 10*3/uL (ref 0.0–0.5)
Eosinophils Relative: 2 %
HCT: 30.7 % — ABNORMAL LOW (ref 39.0–52.0)
Hemoglobin: 9.3 g/dL — ABNORMAL LOW (ref 13.0–17.0)
Immature Granulocytes: 0 %
Lymphocytes Relative: 29 %
Lymphs Abs: 2.2 10*3/uL (ref 0.7–4.0)
MCH: 24.5 pg — ABNORMAL LOW (ref 26.0–34.0)
MCHC: 30.3 g/dL (ref 30.0–36.0)
MCV: 81 fL (ref 80.0–100.0)
Monocytes Absolute: 1.1 10*3/uL — ABNORMAL HIGH (ref 0.1–1.0)
Monocytes Relative: 14 %
Neutro Abs: 4.2 10*3/uL (ref 1.7–7.7)
Neutrophils Relative %: 54 %
Platelets: 364 10*3/uL (ref 150–400)
RBC: 3.79 MIL/uL — ABNORMAL LOW (ref 4.22–5.81)
RDW: 17.8 % — ABNORMAL HIGH (ref 11.5–15.5)
WBC: 7.8 10*3/uL (ref 4.0–10.5)
nRBC: 0 % (ref 0.0–0.2)

## 2021-07-21 LAB — COMPREHENSIVE METABOLIC PANEL
ALT: 55 U/L — ABNORMAL HIGH (ref 0–44)
AST: 31 U/L (ref 15–41)
Albumin: 3.7 g/dL (ref 3.5–5.0)
Alkaline Phosphatase: 57 U/L (ref 38–126)
Anion gap: 8 (ref 5–15)
BUN: 12 mg/dL (ref 6–20)
CO2: 25 mmol/L (ref 22–32)
Calcium: 8.3 mg/dL — ABNORMAL LOW (ref 8.9–10.3)
Chloride: 108 mmol/L (ref 98–111)
Creatinine, Ser: 0.87 mg/dL (ref 0.61–1.24)
GFR, Estimated: >60 mL/min (ref >60)
Glucose, Bld: 87 mg/dL (ref 70–99)
Potassium: 3.5 mmol/L (ref 3.5–5.1)
Sodium: 141 mmol/L (ref 135–145)
Total Bilirubin: 0.6 mg/dL (ref 0.3–1.2)
Total Protein: 6.6 g/dL (ref 6.5–8.1)

## 2021-07-21 LAB — RESP PANEL BY RT-PCR (FLU A&B, COVID) ARPGX2
Influenza A by PCR: NEGATIVE
Influenza B by PCR: NEGATIVE
SARS Coronavirus 2 by RT PCR: NEGATIVE

## 2021-07-21 LAB — SALICYLATE LEVEL: Salicylate Lvl: <7 mg/dL — ABNORMAL LOW (ref 7.0–30.0)

## 2021-07-21 LAB — ACETAMINOPHEN LEVEL: Acetaminophen (Tylenol), Serum: 12 ug/mL (ref 10–30)

## 2021-07-21 LAB — ETHANOL: Alcohol, Ethyl (B): <10 mg/dL (ref <10)

## 2021-07-21 MED ORDER — THIAMINE HCL 100 MG PO TABS
100.0000 mg | ORAL_TABLET | Freq: Every day | ORAL | Status: DC
Start: 2021-07-21 — End: 2021-07-21
  Administered 2021-07-21: 100 mg via ORAL
  Filled 2021-07-21: qty 1

## 2021-07-21 MED ORDER — CALCIUM CARBONATE ANTACID 500 MG PO CHEW
2.0000 | CHEWABLE_TABLET | Freq: Once | ORAL | Status: AC
  Administered 2021-07-21: 400 mg via ORAL
  Filled 2021-07-21 (×2): qty 2

## 2021-07-21 MED ORDER — LORAZEPAM 2 MG/ML IJ SOLN: 0.0000 mg | Freq: Four times a day (QID) | INTRAMUSCULAR | Status: DC

## 2021-07-21 MED ORDER — LORAZEPAM 1 MG PO TABS: 0.0000 mg | ORAL_TABLET | Freq: Four times a day (QID) | ORAL | Status: DC

## 2021-07-21 MED ORDER — LORAZEPAM 1 MG PO TABS: 0.0000 mg | ORAL_TABLET | Freq: Two times a day (BID) | ORAL | Status: DC

## 2021-07-21 MED ORDER — ACETAMINOPHEN 500 MG PO TABS
500.0000 mg | ORAL_TABLET | Freq: Once | ORAL | Status: AC
Start: 2021-07-21 — End: 2021-07-21
  Administered 2021-07-21: 500 mg via ORAL
  Filled 2021-07-21: qty 1

## 2021-07-21 MED ORDER — METOCLOPRAMIDE HCL 5 MG/ML IJ SOLN
10.0000 mg | Freq: Once | INTRAMUSCULAR | Status: AC
Start: 2021-07-21 — End: 2021-07-21
  Administered 2021-07-21: 10 mg via INTRAVENOUS
  Filled 2021-07-21: qty 2

## 2021-07-21 MED ORDER — THIAMINE HCL 100 MG/ML IJ SOLN
100.0000 mg | Freq: Every day | INTRAMUSCULAR | Status: DC
Start: 2021-07-21 — End: 2021-07-21

## 2021-07-21 MED ORDER — LORAZEPAM 2 MG/ML IJ SOLN: 0.0000 mg | Freq: Two times a day (BID) | INTRAMUSCULAR | Status: DC

## 2021-07-21 MED ORDER — SODIUM CHLORIDE 0.9 % IV BOLUS
500.0000 mL | Freq: Once | INTRAVENOUS | Status: AC
  Administered 2021-07-21: 500 mL via INTRAVENOUS

## 2021-07-21 MED ORDER — DIPHENHYDRAMINE HCL 50 MG/ML IJ SOLN
12.5000 mg | Freq: Once | INTRAMUSCULAR | Status: AC
  Administered 2021-07-21: 12.5 mg via INTRAVENOUS
  Filled 2021-07-21: qty 1

## 2021-07-21 NOTE — ED Provider Triage Note (Signed)
Emergency Medicine Provider Triage Evaluation Note ? ?Russell Yang. , a 41 y.o. male  was evaluated in triage.  Pt complains of headache x3 days.  States occipital, throbbing in nature.  Reports nausea enroute.  Denies blurred vision, confusion, numbness/weakness, fevers, neck pain.  No meds taken PTA. ? ?Review of Systems  ?Positive: headache ?Negative: fever ? ?Physical Exam  ?BP 114/89   Pulse 97   Temp 97.8 ?F (36.6 ?C) (Oral)   Resp 16   SpO2 97%  ?Gen:   Awake, no distress   ?Resp:  Normal effort  ?MSK:   Moves extremities without difficulty  ?Other:  AAOx3, speech is clear and goal oriented, moving extremities well ? ?Medical Decision Making  ?Medically screening exam initiated at 12:11 AM.  Appropriate orders placed.  Judge Abott Marriott. was informed that the remainder of the evaluation will be completed by another provider, this initial triage assessment does not replace that evaluation, and the importance of remaining in the ED until their evaluation is complete. ? ?Headache.  No focal deficits noted in triage.  VSS. ?  ?Larene Pickett, PA-C ?07/21/21 0013 ? ?

## 2021-07-21 NOTE — ED Notes (Signed)
Pt's belongings have been inventoried & security is wanding him & his belongings at this time.  ?

## 2021-07-21 NOTE — ED Notes (Signed)
Patient given discharge instructions, all questions answered. Patient in possession of all belongings, directed to the discharge area  

## 2021-07-21 NOTE — ED Notes (Signed)
Patient transported to CT 

## 2021-07-21 NOTE — ED Notes (Signed)
Pt's belongings are in locker #11 & his 2 Large navy blue suitcases are sitting in the floor they do have pt labels on them. ?

## 2021-07-21 NOTE — ED Provider Notes (Addendum)
Emergency Medicine Observation Re-evaluation Note ? ?Russell Yang. is a 41 y.o. male, seen on rounds today.  Pt initially presented to the ED for complaints of Headache ?Earlier headache now resolved. Pt also notes stressors due to housing instability and gets him feeling stressed and down at times. No thoughts or plan to harm self.  ? ?Physical Exam  ?BP 128/78   Pulse 80   Temp 97.7 ?F (36.5 ?C) (Oral)   Resp (!) 21   SpO2 98%  ?Physical Exam ?General: resting comfortably, easily aroused, conversant.  ?Cardiac: regular rate. ?Lungs: breathing comfortably. ?Psych: pt is conversant, pleasant.  Pt exhibits normal mood and affect. Pt does not appear acutely depressed or despondent.  No thoughts of harm to self or others. Pt is not responding to internal stimuli - no delusions or hallucinations noted.  ? ?ED Course / MDM  ? ? ?I have reviewed the labs performed to date as well as medications administered while in observation.  Recent changes in the last 24 hours include ED obs, reassessment.  ? ?Plan  ? ?Russell Yang. is not under involuntary commitment. ? ?Pt reassessed, and appears stable for d/c. ? ?Pt indicates is from [place], has hx intermittent etoh use/abuse. States took bus to [place] last month 'to get a new start' - and then went straight to hospital/BH facility there. Indicates had housing arranged at [facility]' facility in [place] area, but that was kicked out yesterday so came to ED. (As pt with no etoh use/abuse in past 3 weeks, does not need ciwa protocol).  Indicates his longer term plan is to look for area were they have good shelter/social service system.  Will provide resource guide re outpatient social service, shelters, and pcp and bh resources. ? ?Return precautions provided. ? ? ? ?  ?Cathren Laine, MD ?07/21/21 1300 ? ?

## 2021-07-21 NOTE — Discharge Instructions (Addendum)
It was our pleasure to provide your ER care today - we hope that you feel better. ? ?Drink plenty of fluids/stay well hydrated. Take acetaminophen or ibuprofen as need.  Avoid alcohol use, as alcohol overuse/use disorder is harmful to your physical health and mental well-being.  ? ?See resource guides provided as relates shelters, social services, primary care, and behavioral health resources in the area.  ? ?For mental health issues and/or crisis, you may also go to the [facility] - it is open 24/7 and walk-ins are welcome. ? ?Return to ER if worse, new symptoms, fevers, severe pain, chest pain, trouble breathing, or other concern.  ?

## 2021-07-21 NOTE — ED Notes (Signed)
Pt is dressing into burgundy scrubs at this time. He has been informed about the way being psych cleared works & is expecting all of his belongings to be put in a locker/security office.  ?

## 2021-07-21 NOTE — BH Assessment (Signed)
@  0850 Pt is not medically cleared for assessment.  No H&P, labs pending and no mention of SI, as noted in reason for consult. TTS to see at a later time.  ? ?

## 2021-07-21 NOTE — ED Provider Notes (Signed)
?[facility] EMERGENCY DEPARTMENT ?Provider Note ? ? ?CSN: [id number] ?Arrival date & time: 07/20/21  2341 ? ?  ? ?History ? ?Chief Complaint  ?Patient presents with  ? Headache  ? ? ?Russell Yang. is a 41 y.o. male patient reports himself as otherwise healthy no daily medication use.  Patient reports that he has been experiencing a headache x3 days he describes headache as a throbbing occipital headache, it is constant no clear inciting event, he reports that he has not had a headache in several years and this headache does not feel like a typical headache for him that normally resolves with caffeine.  Patient attempted coffee and water without improvement of his symptoms.  Patient reports he does not have the means to obtain medication such as Tylenol and ibuprofen to help with his headache.  Patient reports headache is worsened with bright light or loud sounds, pain does not radiate. ? ?Additionally patient reports that he does drink alcohol daily but has not had a drink in around 2 days.  Patient reports that he has also been experiencing suicidal ideations for the past 2 days, he reports his drinking and current financial situation as stressful for him.  He denies any current plan.  Patient denies visual or auditory hallucinations or homicidal ideations.  Patient does report history of alcohol withdrawal in the past but denies history of DT or seizures. ? ?Patient denies fall/injury, fever, chills, vision change, neck stiffness, vomiting, diarrhea, chest pain/shortness of breath, numbness, tingling, weakness, balance issues.  Additionally patient denies diaphoresis, tremors, anxiety, hallucinations or anxiety.  No other drug use ? ?HPI ? ?  ? ?Home Medications ?Prior to Admission medications   ?Medication Sig Start Date End Date Taking? Authorizing Provider  ?omeprazole (PRILOSEC) 40 MG capsule Take 40 mg by mouth daily.   Yes [provider]  ?   ? ?Allergies    ?Vivitrol  [naltrexone], Abilify [aripiprazole], Haldol [haloperidol], and Risperidone and related   ? ?Review of Systems   ?Review of Systems Ten systems are reviewed and are negative for acute change except as noted in the HPI ? ?Physical Exam ?Updated Vital Signs ?BP 113/69   Pulse 78   Temp 97.7 ?F (36.5 ?C) (Oral)   Resp (!) 21   SpO2 98%  ?Physical Exam ?Constitutional:   ?   General: He is not in acute distress. ?   Appearance: Normal appearance. He is well-developed. He is not ill-appearing or diaphoretic.  ?HENT:  ?   Head: Normocephalic and atraumatic.  ?   Jaw: There is normal jaw occlusion.  ?   Mouth/Throat:  ?   Mouth: Mucous membranes are moist.  ?   Pharynx: Oropharynx is clear.  ?Eyes:  ?   General: Vision grossly intact. Gaze aligned appropriately.  ?   Pupils: Pupils are equal, round, and reactive to light.  ?Neck:  ?   Trachea: Trachea and phonation normal.  ?   Meningeal: Brudzinski's sign absent.  ?Pulmonary:  ?   Effort: Pulmonary effort is normal. No respiratory distress.  ?Abdominal:  ?   General: There is no distension.  ?   Palpations: Abdomen is soft.  ?   Tenderness: There is no abdominal tenderness. There is no guarding or rebound.  ?Musculoskeletal:     ?   General: Normal range of motion.  ?   Cervical back: Normal range of motion.  ?Skin: ?   General: Skin is warm and dry.  ?Neurological:  ?  Mental Status: He is alert.  ?   GCS: GCS eye subscore is 4. GCS verbal subscore is 5. GCS motor subscore is 6.  ?   Comments: Mental Status:  ?Alert, oriented, thought content appropriate, able to give a coherent history. Speech fluent without evidence of aphasia. Able to follow 2 step commands without difficulty.  ?Cranial Nerves:  ?II:  Peripheral visual fields grossly normal, pupils equal, round, reactive to light ?III,IV, VI: ptosis not present, extra-ocular motions intact bilaterally  ?V,VII: smile symmetric, eyebrows raise symmetric, facial light touch sensation equal ?VIII: hearing grossly  normal to voice  ?X: uvula elevates symmetrically  ?XI: bilateral shoulder shrug symmetric and strong ?XII: midline tongue extension without fassiculations ?Motor:  ?Normal tone. 5/5 strength in upper and lower extremities bilaterally including strong and equal grip strength and dorsiflexion/plantar flexion ?Sensory: Sensation intact to light touch in all extremities. Negative Romberg.  ?Deep Tendon Reflexes: 2+ and symmetric in the biceps and patella ?Cerebellar: normal finger-to-nose with bilateral upper extremities. Normal heel-to -shin balance bilaterally of the lower extremity. No pronator drift.  ?Gait: normal gait and balance ?CV: distal pulses palpable throughout ?  ?Psychiatric:     ?   Attention and Perception: He does not perceive auditory or visual hallucinations.     ?   Mood and Affect: Mood is depressed.     ?   Speech: Speech normal.     ?   Behavior: Behavior normal.     ?   Thought Content: Thought content includes suicidal ideation. Thought content does not include homicidal ideation. Thought content does not include suicidal plan.     ?   Cognition and Memory: Cognition normal.  ? ? ?ED Results / Procedures / Treatments   ?Labs ?(all labs ordered are listed, but only abnormal results are displayed) ?Labs Reviewed  ?COMPREHENSIVE METABOLIC PANEL - Abnormal; Notable for the following components:  ?    Result Value  ? Calcium 8.3 (*)   ? ALT 55 (*)   ? All other components within normal limits  ?CBC WITH DIFFERENTIAL/PLATELET - Abnormal; Notable for the following components:  ? RBC 3.79 (*)   ? Hemoglobin 9.3 (*)   ? HCT 30.7 (*)   ? MCH 24.5 (*)   ? RDW 17.8 (*)   ? Monocytes Absolute 1.1 (*)   ? All other components within normal limits  ?SALICYLATE LEVEL - Abnormal; Notable for the following components:  ? Salicylate Lvl <7.0 (*)   ? All other components within normal limits  ?RESP PANEL BY RT-PCR (FLU A&B, COVID) [id number]  ?ETHANOL  ?ACETAMINOPHEN LEVEL  ?RAPID URINE DRUG SCREEN, HOSP PERFORMED   ? ? ?EKG ?EKG Interpretation ? ?Date/Time:  Tuesday July 21 2021 09:24:03 EDT ?Ventricular Rate:  80 ?PR Interval:  138 ?QRS Duration: 112 ?QT Interval:  381 ?QTC Calculation: 440 ?R Axis:   64 ?Text Interpretation: Sinus rhythm Borderline intraventricular conduction delay Confirmed by Alvester Chourifan, Matthew [phone] on 07/21/2021 9:29:58 AM ? ?Radiology ?CT HEAD WO CONTRAST (5MM) ? ?Result Date: 07/21/2021 ?CLINICAL DATA:  Persistent and worsening headache EXAM: CT HEAD WITHOUT CONTRAST TECHNIQUE: Contiguous axial images were obtained from the base of the skull through the vertex without intravenous contrast. RADIATION DOSE REDUCTION: This exam was performed according to the departmental dose-optimization program which includes automated exposure control, adjustment of the mA and/or kV according to patient size and/or use of iterative reconstruction technique. COMPARISON:  None. FINDINGS: Brain: No evidence of acute infarction, hemorrhage, hydrocephalus, extra-axial collection  or mass lesion/mass effect. Vascular: No hyperdense vessel or unexpected calcification. Skull: Normal. Negative for fracture or focal lesion. Sinuses/Orbits: No acute finding. Other: None. IMPRESSION: Negative head CT. Electronically Signed   By: Malachy Moan M.D.   On: 07/21/2021 09:19   ? ?Procedures ?Procedures  ? ? ?Medications Ordered in ED ?Medications  ?LORazepam (ATIVAN) injection 0-4 mg (has no administration in time range)  ?  Or  ?LORazepam (ATIVAN) tablet 0-4 mg (has no administration in time range)  ?LORazepam (ATIVAN) injection 0-4 mg (has no administration in time range)  ?  Or  ?LORazepam (ATIVAN) tablet 0-4 mg (has no administration in time range)  ?thiamine tablet 100 mg (has no administration in time range)  ?  Or  ?thiamine (B-1) injection 100 mg (has no administration in time range)  ?calcium carbonate (TUMS - dosed in mg elemental calcium) chewable tablet 400 mg of elemental calcium (has no administration in time range)   ?metoCLOPramide (REGLAN) injection 10 mg (10 mg Intravenous Given 07/21/21 0832)  ?diphenhydrAMINE (BENADRYL) injection 12.5 mg (12.5 mg Intravenous Given 07/21/21 0831)  ?acetaminophen (TYLENOL) tablet 500 mg (500 mg Oral Gi

## 2021-07-21 NOTE — ED Notes (Signed)
Patient given bus pass.

## 2021-07-21 NOTE — ED Triage Notes (Signed)
Pt complains of headache x3 days.  States occipital, throbbing in nature.  Reports nausea enroute.  Denies blurred vision, confusion, numbness/weakness, fevers, neck pain.  No meds taken PTA. ?

## 2021-07-22 ENCOUNTER — Ambulatory Visit (HOSPITAL_COMMUNITY)
Admission: EM | Admit: 2021-07-22 | Discharge: 2021-07-23 | Disposition: A | Discharge status: Home / Self Care | Payer: No Payment, Other | Attending: Psychiatry | Admitting: Psychiatry

## 2021-07-22 ENCOUNTER — Encounter (HOSPITAL_COMMUNITY): Payer: Self-pay | Admitting: Registered Nurse

## 2021-07-22 DIAGNOSIS — Z765 Malingerer [conscious simulation]: Secondary | ICD-10-CM | POA: Diagnosis not present

## 2021-07-22 DIAGNOSIS — R45851 Suicidal ideations: Secondary | ICD-10-CM | POA: Diagnosis not present

## 2021-07-22 DIAGNOSIS — Z20822 Contact with and (suspected) exposure to covid-19: Secondary | ICD-10-CM | POA: Diagnosis not present

## 2021-07-22 DIAGNOSIS — F1094 Alcohol use, unspecified with alcohol-induced mood disorder: Secondary | ICD-10-CM | POA: Diagnosis not present

## 2021-07-22 DIAGNOSIS — F1020 Alcohol dependence, uncomplicated: Secondary | ICD-10-CM | POA: Diagnosis present

## 2021-07-22 DIAGNOSIS — Z5900 Homelessness unspecified: Secondary | ICD-10-CM

## 2021-07-22 DIAGNOSIS — F1024 Alcohol dependence with alcohol-induced mood disorder: Secondary | ICD-10-CM | POA: Diagnosis not present

## 2021-07-22 DIAGNOSIS — Z79899 Other long term (current) drug therapy: Secondary | ICD-10-CM | POA: Insufficient documentation

## 2021-07-22 LAB — COMPREHENSIVE METABOLIC PANEL
ALT: 53 U/L — ABNORMAL HIGH (ref 0–44)
AST: 29 U/L (ref 15–41)
Albumin: 4.1 g/dL (ref 3.5–5.0)
Alkaline Phosphatase: 77 U/L (ref 38–126)
Anion gap: 8 (ref 5–15)
BUN: 12 mg/dL (ref 6–20)
CO2: 27 mmol/L (ref 22–32)
Calcium: 8.6 mg/dL — ABNORMAL LOW (ref 8.9–10.3)
Chloride: 104 mmol/L (ref 98–111)
Creatinine, Ser: 0.83 mg/dL (ref 0.61–1.24)
GFR, Estimated: >60 mL/min (ref >60)
Glucose, Bld: 73 mg/dL (ref 70–99)
Potassium: 4 mmol/L (ref 3.5–5.1)
Sodium: 139 mmol/L (ref 135–145)
Total Bilirubin: 0.4 mg/dL (ref 0.3–1.2)
Total Protein: 6.7 g/dL (ref 6.5–8.1)

## 2021-07-22 LAB — CBC WITH DIFFERENTIAL/PLATELET
Abs Immature Granulocytes: 0.02 10*3/uL (ref 0.00–0.07)
Basophils Absolute: 0.1 10*3/uL (ref 0.0–0.1)
Basophils Relative: 1 %
Eosinophils Absolute: 0.1 10*3/uL (ref 0.0–0.5)
Eosinophils Relative: 1 %
HCT: 31.6 % — ABNORMAL LOW (ref 39.0–52.0)
Hemoglobin: 9.7 g/dL — ABNORMAL LOW (ref 13.0–17.0)
Immature Granulocytes: 0 %
Lymphocytes Relative: 26 %
Lymphs Abs: 2.4 10*3/uL (ref 0.7–4.0)
MCH: 24.7 pg — ABNORMAL LOW (ref 26.0–34.0)
MCHC: 30.7 g/dL (ref 30.0–36.0)
MCV: 80.4 fL (ref 80.0–100.0)
Monocytes Absolute: 1 10*3/uL (ref 0.1–1.0)
Monocytes Relative: 11 %
Neutro Abs: 5.7 10*3/uL (ref 1.7–7.7)
Neutrophils Relative %: 61 %
Platelets: 376 10*3/uL (ref 150–400)
RBC: 3.93 MIL/uL — ABNORMAL LOW (ref 4.22–5.81)
RDW: 18.2 % — ABNORMAL HIGH (ref 11.5–15.5)
WBC: 9.2 10*3/uL (ref 4.0–10.5)
nRBC: 0 % (ref 0.0–0.2)

## 2021-07-22 LAB — POCT URINE DRUG SCREEN - MANUAL ENTRY (I-SCREEN)
POC Amphetamine UR: NOT DETECTED
POC Buprenorphine (BUP): NOT DETECTED
POC Cocaine UR: NOT DETECTED
POC Marijuana UR: NOT DETECTED
POC Methadone UR: NOT DETECTED
POC Methamphetamine UR: NOT DETECTED
POC Morphine: NOT DETECTED
POC Oxazepam (BZO): NOT DETECTED
POC Oxycodone UR: NOT DETECTED
POC Secobarbital (BAR): NOT DETECTED

## 2021-07-22 LAB — TSH: TSH: 2.95 u[IU]/mL (ref 0.350–4.500)

## 2021-07-22 LAB — URINALYSIS, ROUTINE W REFLEX MICROSCOPIC
Bilirubin Urine: NEGATIVE
Glucose, UA: NEGATIVE mg/dL
Hgb urine dipstick: NEGATIVE
Ketones, ur: 5 mg/dL — AB
Nitrite: NEGATIVE
Protein, ur: 30 mg/dL — AB
Specific Gravity, Urine: 1.023 (ref 1.005–1.030)
pH: 6 (ref 5.0–8.0)

## 2021-07-22 LAB — POC SARS CORONAVIRUS 2 AG: SARSCOV2ONAVIRUS 2 AG: NEGATIVE

## 2021-07-22 LAB — RESP PANEL BY RT-PCR (FLU A&B, COVID) ARPGX2
Influenza A by PCR: NEGATIVE
Influenza B by PCR: NEGATIVE
SARS Coronavirus 2 by RT PCR: NEGATIVE

## 2021-07-22 LAB — POC SARS CORONAVIRUS 2 AG -  ED: SARS Coronavirus 2 Ag: NEGATIVE

## 2021-07-22 LAB — ETHANOL: Alcohol, Ethyl (B): <10 mg/dL (ref <10)

## 2021-07-22 MED ORDER — ACETAMINOPHEN 325 MG PO TABS: 650.0000 mg | ORAL_TABLET | Freq: Four times a day (QID) | ORAL | Status: DC | PRN

## 2021-07-22 MED ORDER — ALUM & MAG HYDROXIDE-SIMETH 200-200-20 MG/5ML PO SUSP: 30.0000 mL | ORAL | Status: DC | PRN

## 2021-07-22 MED ORDER — PANTOPRAZOLE SODIUM 40 MG PO TBEC
40.0000 mg | DELAYED_RELEASE_TABLET | Freq: Every day | ORAL | Status: DC
  Administered 2021-07-22 – 2021-07-23 (×2): 40 mg via ORAL
  Filled 2021-07-22: qty 14
  Filled 2021-07-22 (×2): qty 1

## 2021-07-22 MED ORDER — MAGNESIUM HYDROXIDE 400 MG/5ML PO SUSP: 30.0000 mL | Freq: Every day | ORAL | Status: DC | PRN

## 2021-07-22 NOTE — ED Notes (Signed)
Pt admitted to overnight obs due to endorsing SI. Pt denies plan at current. Denies HI/AVH. No acute distress noted. Calm, cooperative throughout assessment. Oriented to unit and unit rules. Safety maintained. ?

## 2021-07-22 NOTE — ED Notes (Signed)
Pt is sitting in chair next to his bed , he is sad and depressed. He is guarded but will answer your questions. Will continue to monitor for safety ?

## 2021-07-22 NOTE — Discharge Instructions (Addendum)
Discharge recommendations:  ?Patient is to take medications as prescribed. ?Please see information for follow-up appointment with psychiatry and therapy. ?Please follow up with your primary care provider for all medical related needs.  ? ?Therapy: We recommend that patient participate in individual therapy to address mental health concerns. ? ?Medications: The parent/guardian is to contact a medical professional and/or outpatient provider to address any new side effects that develop. Parent/guardian should update outpatient providers of any new medications and/or medication changes.  ? ?Safety:  ?The patient should abstain from use of illicit substances/drugs and abuse of any medications. ?If symptoms worsen or do not continue to improve or if the patient becomes actively suicidal or homicidal then it is recommended that the patient return to the closest hospital emergency department, the [facility], or call 911 for further evaluation and treatment. ?National Suicide Prevention Lifeline 1-800-SUICIDE or 2103301304. ? ?About 988 ?988 offers 24/7 access to trained crisis counselors who can help people experiencing mental health-related distress. People can call or text 988 or chat 988lifeline.org for themselves or if they are worried about a loved one who may need crisis support.  ? ? ?To help you maintain a sober lifestyle, a substance use disorder treatment program may be beneficial to you.  You are scheduled for an intake appointment with [facility] on Monday, July 27, 2021 at 7:45 am.  Please be sure to have any medications with you that you will need over the next month.  Also, please refer to the list of permitted/contraband items given to you by [facility] staff: ? ?     [facility] ?     [place] ?     [place], [place] [address] ?     [phone]  ? ?For your shelter needs, contact the following service providers: ? ?     [facility] [employer] [facility]  (operated by [facility]) ?     [place] ?     [place], [place] [address] ?     [phone] ? ?     [facility] ?     [place], [place] [address] ?     [phone] ? ?For day shelter and other supportive services for the homeless, contact the [facility]): ? ?     [facility] ?     [address]     [place], [place] [address] ?     [phone] ? ?For transitional housing, Secondary school teacher.  They provide longer term housing than a shelter, but there is an application process: ? ?     [facility] ?     [facility] [place] ?     [address] ?     [place], [place] [address] ?     [phone] ?

## 2021-07-22 NOTE — BH Assessment (Addendum)
Triage Note: ?Russell Yang:  Patient presents to the [facility] seeking help for his alcohol problem, depression and suicidal ideation. Patient states that he was living at [facility] [place] until Tuesday when he was discharged for missing a meeting. He states that he has been homeless since then and states that he was not able to get into a shelter because they are full. Patient states that he relapsed last night and states that he drank 3 pints of vodka. Patient states that he has been having suicidal thoughts because he thought he could get back with his ex-girlfriend, but he states that she will not even answer her phone. He states that he has been depressed about this and states that he has been thinking about ending things. He states that if he did kill himself that he would most likely drink anti-freeze. He states that he is too scared to jump off a bridge. Patient denies HI/Psychosis. Patient presented to the [facility] with all of his belongings. He does not appear to be in any distress and is most likely seeking admission for seconday gain of housing. Patient states that he is from [place] and states that he has no resources or support here.  Patient is routine ?

## 2021-07-22 NOTE — BH Assessment (Signed)
Comprehensive Clinical Assessment (CCA) Note ? ?07/22/2021 ?Russell Yang [employer]. ?[id number] ? ?Disposition:  Per Assunta Found, NP, patient is recommended for continued assessment ? ?The patient demonstrates the following risk factors for suicide: Chronic risk factors for suicide include: psychiatric disorder of depression and substance use disorder. Acute risk factors for suicide include: family or marital conflict and unemployment. Protective factors for this patient include: hope for the future. Considering these factors, the overall suicide risk at this point appears to be moderate. Patient is not appropriate for outpatient follow up.  ? ?PHQ2-9   ? ?Flowsheet Row ED from 07/22/2021 in [facility]  ?PHQ-2 Total Score 4  ?PHQ-9 Total Score 14  ? ?  ? ?Flowsheet Row ED from 07/22/2021 in [facility] ED from 07/20/2021 in [facility] EMERGENCY DEPARTMENT  ?C-SSRS RISK CATEGORY Low Risk Low Risk  ? ?  ? ? ? ?Chief Complaint:  ?Chief Complaint  ?Patient presents with  ? Delusional  ? Suicidal  ? Addiction Problem  ? ?Visit Diagnosis: F10.94 Alcohol Induced Mood Disorder ?                            F10.20 Alcohol Use Disorder Severe ? ? ?CCA Screening, Triage and Referral (STR) ? ?Patient Reported Information ?How did you hear about [place]? Self ? ?What Is the Reason for Your Visit/Call Today? Patient presents to the [facility] seeking help for his alcohol problem, depression and suicidal ideation. Patient states that he was living at [facility] [place] until Tuesday when he was discharged for missing a meeting.  He states that he has been homeless since then and states that he was not able to get into a shelter because they are full. Patient states that he relapsed last night and states that he drank 3 pints of vodka.  Patient states that he has been having suicidal thoughts because he thought he could get back with his ex-girlfriend, but he states that  she will not even answer her phone.  He states that he has been depressed about this and states that he has been thinking about ending things.  He states that if he did kill himself that he would most likely drink anti-freeze.  He states that he is too scared to jump off a bridge.  Patient denies HI/Psychosis.  Patient presented to the [facility] with all of his belongings.  He does not appar to be in any distress and is most likely seeking admission for seconday gain of housing.  Patient states that he is from [place] and states that he has no resources or support here. ? ?Patient is alert and oriented.  His mood is depressed and his affect flat.  His judgment, insight and impulse control are impaired.  His thoughts are organized and his memory is intact.  He does not appear to be responding to any internal stimuli.  His speech is normal in tone and rate and his eye contact is good. ? ?How Long Has This Been Causing You Problems? <Week ? ?What Do You Feel Would Help You the Most Today? Treatment for Depression or other mood problem; Alcohol or Drug Use Treatment ? ? ?Have You Recently Had Any Thoughts About Hurting Yourself? No data recorded ?Are You Planning to Commit Suicide/Harm Yourself At This time? -- (suicidal due to psycho-social situation) ? ? ?Have you Recently Had Thoughts About Hurting Someone Karolee Ohs? No data  recorded ?Are You Planning to Harm Someone at This Time? No data recorded ?Explanation: No data recorded ? ?Have You Used Any Alcohol or Drugs in the Past 24 Hours? Yes ? ?How Long Ago Did You Use Drugs or Alcohol? No data recorded ?What Did You Use and How Much? states that he drank until he passed out,  three pints of alcohol ? ? ?Do You Currently Have a Therapist/Psychiatrist? No data recorded ?Name of Therapist/Psychiatrist: No data recorded ? ?Have You Been Recently Discharged From Any Office Practice or Programs? No data recorded ?Explanation of Discharge From Practice/Program: No data  recorded ? ?  ?CCA Screening Triage Referral Assessment ?Type of Contact: No data recorded ?Telemedicine Service Delivery:   ?Is this Initial or Reassessment? No data recorded ?Date Telepsych consult ordered in [facility]:  No data recorded ?Time Telepsych consult ordered in [facility]:  No data recorded ?Location of Assessment: No data recorded ?Provider Location: No data recorded ? ?Collateral Involvement: No data recorded ? ?Does Patient Have a Automotive engineer Guardian? No data recorded ?Name and Contact of Legal Guardian: No data recorded ?If Minor and Not Living with Parent(s), Who has Custody? No data recorded ?Is CPS involved or ever been involved? No data recorded ?Is APS involved or ever been involved? No data recorded ? ?Patient Determined To Be At Risk for Harm To Self or Others Based on Review of Patient Reported Information or Presenting Complaint? No data recorded ?Method: No data recorded ?Availability of Means: No data recorded ?Intent: No data recorded ?Notification Required: No data recorded ?Additional Information for Danger to Others Potential: No data recorded ?Additional Comments for Danger to Others Potential: No data recorded ?Are There Guns or Other Weapons in Your Home? No data recorded ?Types of Guns/Weapons: No data recorded ?Are These Weapons Safely Secured?                            No data recorded ?Who Could Verify You Are Able To Have These Secured: No data recorded ?Do You Have any Outstanding Charges, Pending Court Dates, Parole/Probation? No data recorded ?Contacted To Inform of Risk of Harm To Self or Others: No data recorded ? ? ?Does Patient Present under Involuntary Commitment? No data recorded ?IVC Papers Initial File Date: No data recorded ? ?[place] of Residence: No data recorded ? ?Patient Currently Receiving the Following Services: No data recorded ? ?Determination of Need: Routine (7 days) (Patient needs residential treatment services or a [facility].) ? ? ?Options For Referral:  Facility-Based Crisis; Inpatient Hospitalization; BH Urgent Care ? ? ? ? ?CCA Biopsychosocial ?Patient Reported Schizophrenia/Schizoaffective Diagnosis in Past: No ? ? ?Strengths: Patients did not identify any strengths ? ? ?Mental Health Symptoms ?Depression:   ?Hopelessness ?  ?Duration of Depressive symptoms:  ?Duration of Depressive Symptoms: Greater than two weeks ?  ?Mania:   ?None ?  ?Anxiety:    ?None ?  ?Psychosis:   ?None ?  ?Duration of Psychotic symptoms:    ?Trauma:   ?None ?  ?Obsessions:   ?None ?  ?Compulsions:   ?None ?  ?Inattention:   ?None ?  ?Hyperactivity/Impulsivity:   ?None ?  ?Oppositional/Defiant Behaviors:   ?None ?  ?Emotional Irregularity:   ?Intense/unstable relationships ?  ?Other Mood/Personality Symptoms:   ?depressed mood, flat affect ?  ? ?Mental Status Exam ?Appearance and self-care  ?Stature:   ?Small ?  ?Weight:   ?Thin ?  ?Clothing:   ?Neat/clean ?  ?  Grooming:   ?Well-groomed ?  ?Cosmetic use:   ?None ?  ?Posture/gait:   ?Normal ?  ?Motor activity:   ?Not Remarkable ?  ?Sensorium  ?Attention:   ?Normal ?  ?Concentration:   ?Normal ?  ?Orientation:   ?Object; Person; Place; Situation; Time ?  ?Recall/memory:   ?Normal ?  ?Affect and Mood  ?Affect:   ?Flat; Depressed ?  ?Mood:   ?Depressed; Hopeless ?  ?Relating  ?Eye contact:   ?Normal ?  ?Facial expression:   ?Depressed ?  ?Attitude toward examiner:   ?Cooperative ?  ?Thought and Language  ?Speech flow:  ?Clear and Coherent ?  ?Thought content:   ?Appropriate to Mood and Circumstances ?  ?Preoccupation:   ?None ?  ?Hallucinations:   ?None ?  ?Organization:  No data recorded  ?Executive Functions  ?Fund of Knowledge:   ?Average ?  ?Intelligence:   ?Average ?  ?Abstraction:  No data recorded  ?Judgement:  No data recorded  ?Reality Testing:   ?Realistic ?  ?Insight:   ?Lacking ?  ?Decision Making:   ?Impulsive ?  ?Social Functioning  ?Social Maturity:   ?Impulsive ?  ?Social Judgement:   ?Normal ?  ?Stress  ?Stressors:   ?Family  conflict; Housing; Surveyor, quantityinancial; Relationship ?  ?Coping Ability:   ?Exhausted ?  ?Skill Deficits:   ?Decision making; Responsibility ?  ?Supports:   ?Support needed ?  ? ? ?Religion: ?Religion/Spirituality ?Are You A Re

## 2021-07-22 NOTE — ED Provider Notes (Signed)
Behavioral Health Admission H&P ?(FBC & OBS) ? ?Date: 07/22/21 ?Patient Name: Russell Yang. ?MRN: [id number] ?Chief Complaint:  ?Chief Complaint  ?Patient presents with  ? Delusional  ? Suicidal  ? Addiction Problem  ?   ? ?Diagnoses:  ?Final diagnoses:  ?Alcohol-induced mood disorder with depressive symptoms (HCC)  ?Alcohol use disorder, moderate, dependence (HCC)  ?Malingering  ?Suicidal ideation  ? ? ?HPI: Russell Yang. 41 y.o., male patient presented to [facility] as a walk in with complaints of alcohol abuse, depression, and requesting detox ? ?Russell Yang., 41 y.o., male patient seen face to face by this provider, consulted with Dr. Earlene Plater; and chart reviewed on 07/22/21.  Patient seen at [facility] ED 07/21/21 and discharged with resources.  Patient seen at [facility] 07/01/2021 and discharged with resources. Patient presentation was similar at both facilities and also similar during this presentation but has since added the suicidal ideation.    ? ?On evaluation Russell Yang. Reports he is depressed related to his alcohol abuse, recent relapse, and not being with his wife and kids.  Patient states he was kicked out of [facility] 3 days ago related to missing a day meeting that he was unaware of.  States he moved here from [place] because he wanted to get his life together and be with his wife but couldn't stay with his wife and he is now homeless.  (Chart review shows he was in [place] prior to [place], and in [place]).  Patient states that he is now suicidal.  Patient asked if he had plan or intent to kill himself and he states ?I would probably drink that stuff you put in cars.  What do you call it (antifreeze).  I don't have a gun and I don't think I would jump off of a bridge.?  Patient states that his depression stims from not being with his wife and children.  The depression causes him to drink and the drinking causes the suicidal ideation. Patient  denies homicidal ideation, psychosis, and paranoia.    ?During evaluation Russell Yang. is sitting in chair in no acute distress.  There are no noted visible symptoms of alcohol withdrawal.  He is alert, oriented x 4, calm, cooperative and attentive.  His mood is depressed with congruent affect.  He has normal speech, and behavior.  Objectively there is no evidence of psychosis/mania or delusional thinking.  Patient is able to converse coherently, goal directed thoughts, no distractibility, or pre-occupation.  He denies homicidal ideation, psychosis, and paranoia; but continues to endorse passive suicidal thoughts without a specific plan.   Patient answered question appropriately.    ? ?PHQ 2-9:   ?Flowsheet Row ED from 07/20/2021 in [facility] EMERGENCY DEPARTMENT  ?C-SSRS RISK CATEGORY Low Risk  ? ?  ?  ? ?Total Time spent with patient: 30 minutes ? ?Musculoskeletal  ?Strength & Muscle Tone: within normal limits ?Gait & Station: normal ?Patient leans: N/A ? ?Psychiatric Specialty Exam  ?Presentation ?General Appearance: Appropriate for Environment ? ?Eye Contact:Good ? ?Speech:Clear and Coherent; Normal Rate ? ?Speech Volume:Normal ? ?Handedness:Right ? ? ?Mood and Affect  ?Mood:Depressed ? ?Affect:Congruent ? ? ?Thought Process  ?Thought Processes:Coherent; Goal Directed ? ?Descriptions of Associations:Intact ? ?Orientation:Full (Time, Place and Person) ? ?Thought Content:Logical ?   ?Hallucinations:Hallucinations: None ? ?Ideas of Reference:None ? ?Suicidal Thoughts:Suicidal Thoughts: Yes, Passive (?I would probably drink that stuff you put in cars.  What do you call it (antifreeze).  I don?t have a gun and I don?t think I would jump off of a bridge.?) ?SI Passive Intent and/or Plan: Without Intent ? ?Homicidal Thoughts:Homicidal Thoughts: No ? ? ?Sensorium  ?Memory:Immediate Good; Recent Good; Remote Good ? ?Judgment:Intact ? ?Insight:Fair; Present ? ? ?Executive Functions   ?Concentration:Good ? ?Attention Span:Good ? ?Recall:Good ? ?Fund of Knowledge:Good ? ?Language:Good ? ? ?Psychomotor Activity  ?Psychomotor Activity:Psychomotor Activity: Normal ? ? ?Assets  ?Assets:Communication Skills; Desire for Improvement ? ? ?Sleep  ?Sleep:Sleep: Good ? ? ?Nutritional Assessment (For OBS and FBC admissions only) ?Has the patient had a weight loss or gain of 10 pounds or more in the last 3 months?: No ?Has the patient had a decrease in food intake/or appetite?: No ?Does the patient have dental problems?: No ?Does the patient have eating habits or behaviors that may be indicators of an eating disorder including binging or inducing vomiting?: No ?Has the patient recently lost weight without trying?: 0 ?Has the patient been eating poorly because of a decreased appetite?: 0 ?Malnutrition Screening Tool Score: 0 ? ? ? ?Physical Exam ?Vitals (Doesn't  appear to be intoxicated and no visible symptoms of alcohol withdrawal) and nursing note reviewed. Exam conducted with a chaperone present.  ?Constitutional:   ?   General: He is not in acute distress. ?   Appearance: Normal appearance. He is not ill-appearing.  ?Cardiovascular:  ?   Rate and Rhythm: Normal rate.  ?Pulmonary:  ?   Effort: Pulmonary effort is normal.  ?Musculoskeletal:     ?   General: Normal range of motion.  ?   Cervical back: Normal range of motion.  ?Skin: ?   General: Skin is warm and dry.  ?Neurological:  ?   Mental Status: He is alert and oriented to person, place, and time.  ?Psychiatric:     ?   Attention and Perception: Attention and perception normal. He does not perceive visual hallucinations.     ?   Mood and Affect: Affect normal. Mood is depressed.     ?   Speech: Speech normal.     ?   Behavior: Behavior normal. Behavior is cooperative.     ?   Thought Content: Thought content is not paranoid. Thought content includes suicidal ideation. Thought content does not include homicidal ideation. Thought content does not  include suicidal plan.     ?   Cognition and Memory: Cognition and memory normal.     ?   Judgment: Judgment is impulsive.  ? ?ROS ? ?There were no vitals taken for this visit. There is no height or weight on file to calculate BMI. ? ?Past Psychiatric History: Alcohol use disorder, intoxication, induced mood disorder, Depression  ? ?Is the patient at risk to self? Yes  ?Has the patient been a risk to self in the past 6 months? No .    ?Has the patient been a risk to self within the distant past? No   ?Is the patient a risk to others? No   ?Has the patient been a risk to others in the past 6 months? No   ?Has the patient been a risk to others within the distant past? No  ? ?Past Medical History: History reviewed. No pertinent past medical history. History reviewed. No pertinent surgical history. ? ?Family History: History reviewed. No pertinent family history. ? ?Social History:  ?Social History  ? ?Socioeconomic History  ? Marital status: Single  ?  Spouse name:  Not on file  ? Number of children: Not on file  ? Years of education: Not on file  ? Highest education level: Not on file  ?Occupational History  ? Not on file  ?Tobacco Use  ? Smoking status: Never  ? Smokeless tobacco: Never  ?Substance and Sexual Activity  ? Alcohol use: Not on file  ? Drug use: Not on file  ? Sexual activity: Not on file  ?Other Topics Concern  ? Not on file  ?Social History Narrative  ? Not on file  ? ?Social Determinants of Health  ? ?Financial Resource Strain: Not on file  ?Food Insecurity: Not on file  ?Transportation Needs: Not on file  ?Physical Activity: Not on file  ?Stress: Not on file  ?Social Connections: Not on file  ?Intimate Partner Violence: Not on file  ? ? ?SDOH:  ?SDOH Screenings  ? ?Alcohol Screen: Not on file  ?Depression (PHQ2-9): Not on file  ?Financial Resource Strain: Not on file  ?Food Insecurity: Not on file  ?Housing: Not on file  ?Physical Activity: Not on file  ?Social Connections: Not on file  ?Stress: Not  on file  ?Tobacco Use: Low Risk   ? Smoking Tobacco Use: Never  ? Smokeless Tobacco Use: Never  ? Passive Exposure: Not on file  ?Transportation Needs: Not on file  ? ? ?Last Labs:  ?Admission on 07/20/2021, Discharged on 04/04

## 2021-07-22 NOTE — ED Notes (Signed)
STAT lab courier called to transport labs to [facility] lab 

## 2021-07-23 MED ORDER — PANTOPRAZOLE SODIUM 40 MG PO TBEC: 40.0000 mg | DELAYED_RELEASE_TABLET | Freq: Every day | ORAL | 0 refills | Status: AC

## 2021-07-23 NOTE — BH Assessment (Signed)
[facility] Assessment Progress Note ?  ?Per Darrol Angel, NP, this pt does not require psychiatric hospitalization at this time.  Pt is psychiatrically cleared.  Pt would, however, benefit from admission to a residential treatment facility for substance use disorders.  At 12:09 this Probation officer spoke to [place] from [employer] in [place].  Pt is scheduled for an intake appointment with them on Monday, 07/27/2021 at 07:45.  This has been included in pt's discharge instructions along with recommendation to have any medications that he will need with up when he arrives.  Sharyn Lull also sent a list of permitted and contraband items, which I have printed and given to pt's nurse Shatara to pass on to the pt.  [place] and [place], have been notified. ? ?Jalene Mullet, MA ?Triage Specialist ?[phone]  ?

## 2021-07-23 NOTE — ED Provider Notes (Signed)
FBC/OBS ASAP Discharge Summary ? ?Date and Time: 07/23/2021 11:07 AM  ?Name: Russell Yang.  ?MRN:  [id number]  ? ?Discharge Diagnoses:  ?Final diagnoses:  ?Alcohol-induced mood disorder with depressive symptoms (HCC)  ?Alcohol use disorder, moderate, dependence (HCC)  ?Malingering  ?Suicidal ideation  ? ? ?Subjective: Patient seen and evaluated face to face by this provider, chart reviewed and cased discussed with Dr. Bronwen Betters. On evaluation, patient is alert and oriented x 4. His thought process is logical and goal oriented. His speech is clear and coherent. His mood is depressed, and affect is congruent.  ? ?He states that he is here for depression and suicidal thoughts. Today, he endorses depressive symptoms of hopelessness and nothing to live for. He states that he treats his depression by drinking. He denies suicidal ideations and states ?If you can get me into recovery, I will be okay.? When asked a second time if he is having suicidal ideations, he states, "no."  He denies HI. He denies AVH. He does not appear to be responding to internal or external stimuli. He identifies his life stressors as getting kicked out of [facility] because he didn't attend a meeting, ex-wife stopped talking to him and didn't buy him a bus ticket and he hasn't seen his kids in a long time. When asked where he was going with a bus ticket, he states that he does not know, and that his ex-wife did not tell him where she was. He states that he does not have family in [place] or anywhere. He states that he was in living in [place] and came to [facility] in March. He states that he was at the [facility] in [place] at [facility] for depression and suicidal thoughts and he was sent to [facility] to [facility] for recovery treatment. He denies using illicit drugs. He reports relapsing on alcohol on Wednesday night and states that he drank 3 pints of vodka. He denies alcohol withdrawal symptoms.  ? ?Stay Summary: Russell Yang. 41  y.o., male patient presented to [facility] as a walk in on 07/23/21 with complaints of alcohol abuse, depression, and requesting detox ?  ?Russell Yang., 41 y.o., male patient seen face to face by this provider, consulted with Dr. Earlene Plater; and chart reviewed on 07/22/21. Patient seen at [facility] ED 07/21/21 and discharged with resources.  Patient seen at [facility] 07/01/2021 and discharged with resources. Patient presentation was similar at both facilities and also similar during this presentation but has since added the suicidal ideation.  ? ?Patient was admitted to the [facility] continuous assessment unit and observed overnight. Patient has been observed on the unit without any disruptive, aggressive, psychotic, or self-harm behaviors. Patient requesting residential substance use treatment. Patient was provided with a list of substance use resources that was provided by the counselor and patient was encouraged to contact  the list of facilities for availability/admission. Per nursing, patient requested transportation to the [facility] [employer] in [place]. Patient later states that he was accepted to [facility] for screening and the facility is requesting an assessment for possible interview today. Sydnee Cabal., Counselor followed up with [facility]. Patient's appointment at [facility] is on Monday, July 27, 2021 at 7:45 am.  ? ?Patient's suicidality is conditioned on obtaining housing/shelter and the patient appears to have secondary gain due to homelessness.Patient endorses suicidal ideations to obtain housing and upon achieving placement the patient no longer endorses suicidal ideations.  ? ?Total Time spent with patient:  15 minutes ? ?Past Psychiatric History: Alcohol use disorder, intoxication, induced mood disorder, Depression  ?Past Medical History: History reviewed. No pertinent past medical history. History reviewed. No pertinent surgical history. ?Family History: History reviewed. No pertinent  family history. ?Family Psychiatric History: No hx reported. ?Social History:  ?Social History  ? ?Substance and Sexual Activity  ?Alcohol Use None  ?   ?Social History  ? ?Substance and Sexual Activity  ?Drug Use Not on file  ?  ?Social History  ? ?Socioeconomic History  ? Marital status: Single  ?  Spouse name: Not on file  ? Number of children: Not on file  ? Years of education: Not on file  ? Highest education level: Not on file  ?Occupational History  ? Not on file  ?Tobacco Use  ? Smoking status: Never  ? Smokeless tobacco: Never  ?Substance and Sexual Activity  ? Alcohol use: Not on file  ? Drug use: Not on file  ? Sexual activity: Not on file  ?Other Topics Concern  ? Not on file  ?Social History Narrative  ? Not on file  ? ?Social Determinants of Health  ? ?Financial Resource Strain: Not on file  ?Food Insecurity: Not on file  ?Transportation Needs: Not on file  ?Physical Activity: Not on file  ?Stress: Not on file  ?Social Connections: Not on file  ? ?SDOH:  ?SDOH Screenings  ? ?Alcohol Screen: Not on file  ?Depression (PHQ2-9): Medium Risk  ? PHQ-2 Score: 14  ?Financial Resource Strain: Not on file  ?Food Insecurity: Not on file  ?Housing: Not on file  ?Physical Activity: Not on file  ?Social Connections: Not on file  ?Stress: Not on file  ?Tobacco Use: Low Risk   ? Smoking Tobacco Use: Never  ? Smokeless Tobacco Use: Never  ? Passive Exposure: Not on file  ?Transportation Needs: Not on file  ? ? ?Tobacco Cessation:  Prescription not provided because: declined ? ?Current Medications:  ?Current Facility-Administered Medications  ?Medication Dose Route Frequency Provider Last Rate Last Admin  ? acetaminophen (TYLENOL) tablet 650 mg  650 mg Oral Q6H PRN Rankin, Shuvon B, NP      ? alum & mag hydroxide-simeth (MAALOX/MYLANTA) 200-200-20 MG/5ML suspension 30 mL  30 mL Oral Q4H PRN Rankin, Shuvon B, NP      ? magnesium hydroxide (MILK OF MAGNESIA) suspension 30 mL  30 mL Oral Daily PRN Rankin, Shuvon B, NP       ? pantoprazole (PROTONIX) EC tablet 40 mg  40 mg Oral Daily Rankin, Shuvon B, NP   40 mg at 07/23/21 0910  ? ?Current Outpatient Medications  ?Medication Sig Dispense Refill  ? cyclobenzaprine (FLEXERIL) 10 MG tablet Take 10 mg by mouth at bedtime as needed for muscle spasms.    ? ibuprofen (ADVIL) 800 MG tablet Take 800 mg by mouth 3 (three) times daily as needed for mild pain.    ? omeprazole (PRILOSEC) 40 MG capsule Take 120-160 mg by mouth daily.    ? predniSONE (DELTASONE) 20 MG tablet Take 40 mg by mouth daily.    ? ? ?PTA Medications: (Not in a hospital admission) ? ? ?Musculoskeletal  ?Strength & Muscle Tone: within normal limits ?Gait & Station: normal ?Patient leans: N/A ? ?Psychiatric Specialty Exam  ?Presentation  ?General Appearance: Appropriate for Environment ? ?Eye Contact:Fair ? ?Speech:Clear and Coherent ? ?Speech Volume:Normal ? ?Handedness:Right ? ? ?Mood and Affect  ?Mood:Depressed ? ?Affect:Congruent ? ? ?Thought Process  ?Thought Processes:Coherent; Goal Directed ? ?  Descriptions of Associations:Intact ? ?Orientation:Full (Time, Place and Person) ? ?Thought Content:Logical ? Diagnosis of Schizophrenia or Schizoaffective disorder in past: No ?  ? Hallucinations:Hallucinations: None ? ?Ideas of Reference:None ? ?Suicidal Thoughts:Suicidal Thoughts: No ?SI Passive Intent and/or Plan: Without Intent ? ?Homicidal Thoughts:Homicidal Thoughts: No ? ? ?Sensorium  ?Memory:Immediate Fair; Remote Fair; Recent Fair ? ?Judgment:Fair ? ?Insight:Fair ? ? ?Executive Functions  ?Concentration:Fair ? ?Attention Span:Fair ? ?Recall:Fair ? ?Fund of Knowledge:Fair ? ?Language:Fair ? ? ?Psychomotor Activity  ?Psychomotor Activity:Psychomotor Activity: Normal ? ? ?Assets  ?Assets:Communication Skills; Desire for Improvement; Leisure Time; Physical Health ? ? ?Sleep  ?Sleep:Sleep: Fair ? ? ?Nutritional Assessment (For OBS and FBC admissions only) ?Has the patient had a weight loss or gain of 10 pounds or more in the  last 3 months?: No ?Has the patient had a decrease in food intake/or appetite?: No ?Does the patient have dental problems?: No ?Does the patient have eating habits or behaviors that may be indicators of

## 2021-07-23 NOTE — BH Assessment (Signed)
[facility] Assessment Progress Note ?  ?Per Darrol Angel, NP this pt would benefit from admission to a residential program for treatment of substance use disorders.  Reviewing pt's chart it appears that he does not require detoxification at this time.  Referral information has been entered into pt's discharge instructions so that pt can begin making calls immediately to find a suitable program.  Sharl Ma has been notified. ? ?Jalene Mullet, MA ?Behavioral Health Coordinator ?[phone]  ?

## 2021-07-23 NOTE — Progress Notes (Signed)
Patient resting with unlabored respirations. No acute distressed noted at this time. Staff will continue to monitor. ?

## 2021-07-23 NOTE — ED Notes (Signed)
Pt sleeping@this time. Breathing even and unlabored. Will continue to monitor for safety 

## 2021-07-23 NOTE — ED Notes (Signed)
Breakfast given.  

## 2021-07-23 NOTE — ED Notes (Signed)
Patient A&O x 4, ambulatory. Patient discharged in no acute distress. Patient denied SI/HI, A/VH upon discharge. Patient verbalized understanding of all discharge instructions explained by staff, to include follow up appointments and safety plan. Pt belongings returned to patient from locker intact. Patient escorted to lobby via staff. Safety maintained.   ?

## 2022-05-19 IMAGING — CT CT HEAD W/O CM
4 series · 16 of 47 positions shown, 18 images · non-contrast
Comparison: None.

CLINICAL DATA: Persistent and worsening headache



[Series 2: head wo · axial · 0.45mm/px · z∈[-50,+60]mm · 7 of 30 slices shown, 9 images]
[im 4/30  brain]
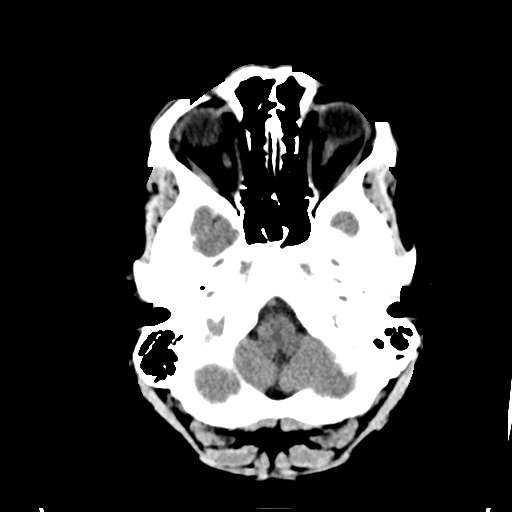
[im 4/30  bone]
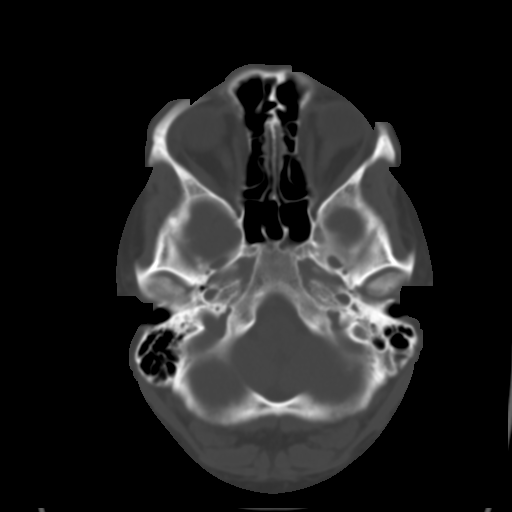
[im 8/30  brain]
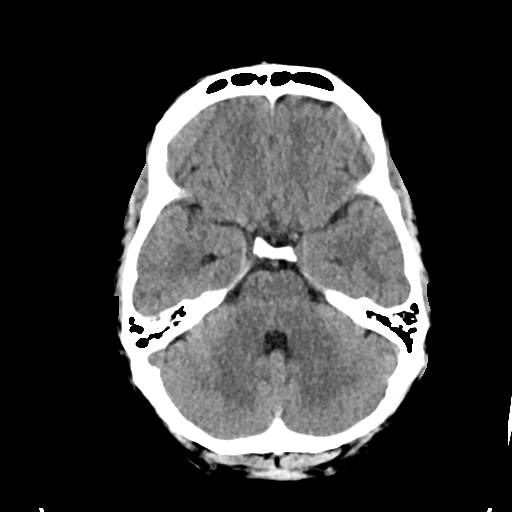
[im 11/30  brain]
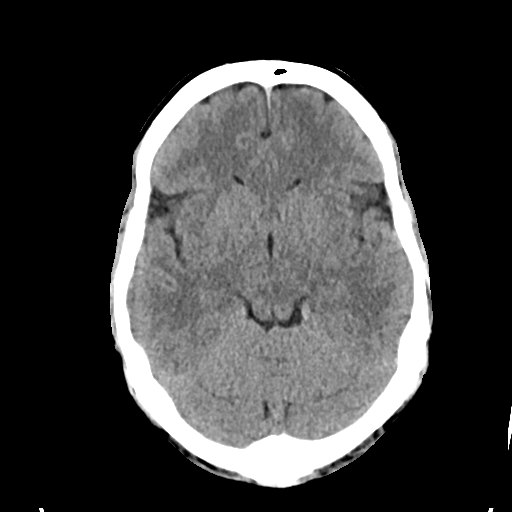
[im 15/30  brain]
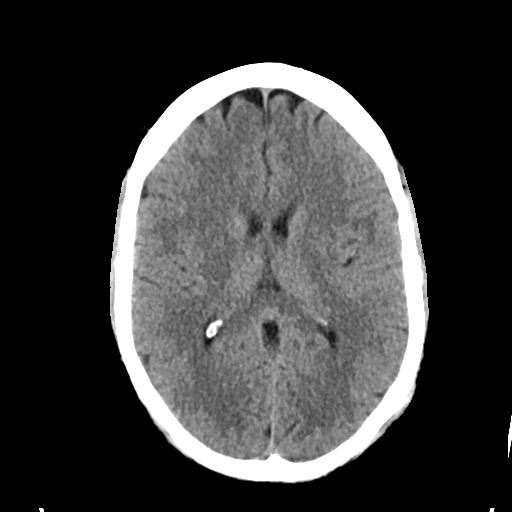
[im 19/30  brain]
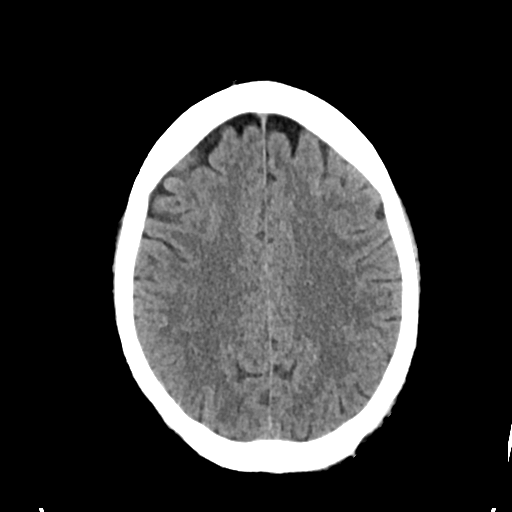
[im 19/30  bone]
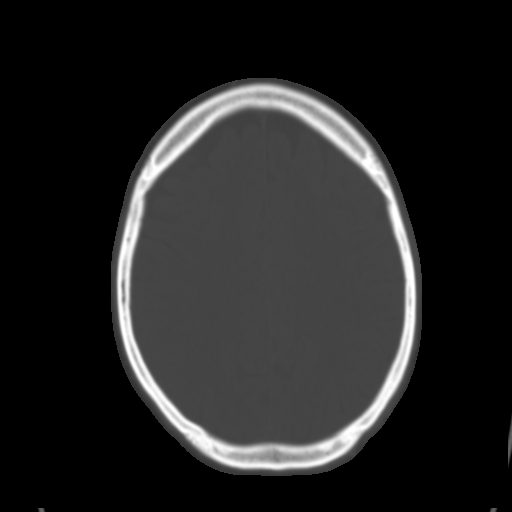
[im 22/30  brain]
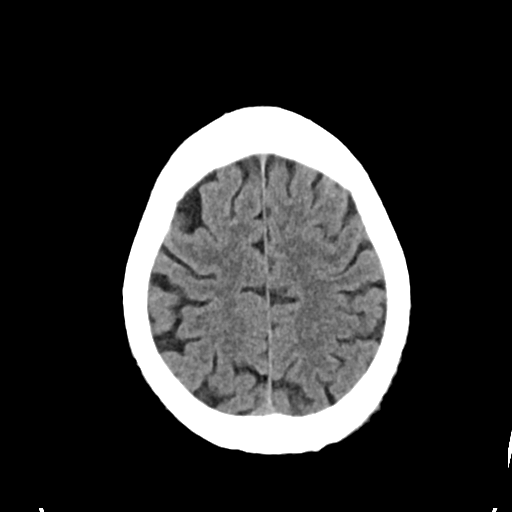
[im 26/30  brain]
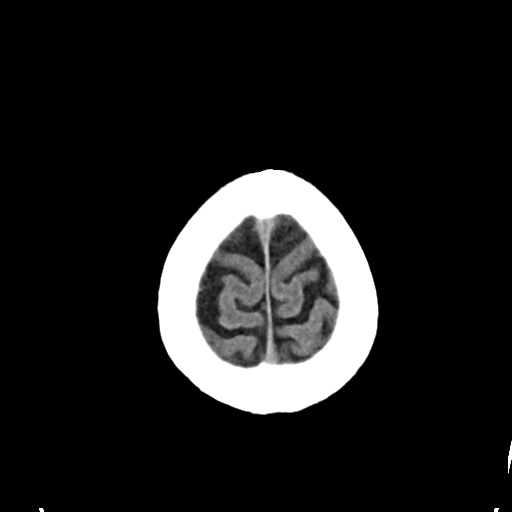

[Series 3: head bone · axial · 0.45mm/px · z∈[-50,-22]mm · 3 of 74 slices shown]
[im 8/74  bone]
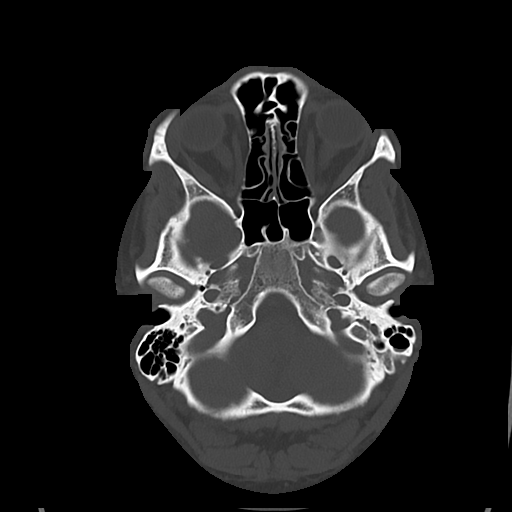
[im 15/74  bone]
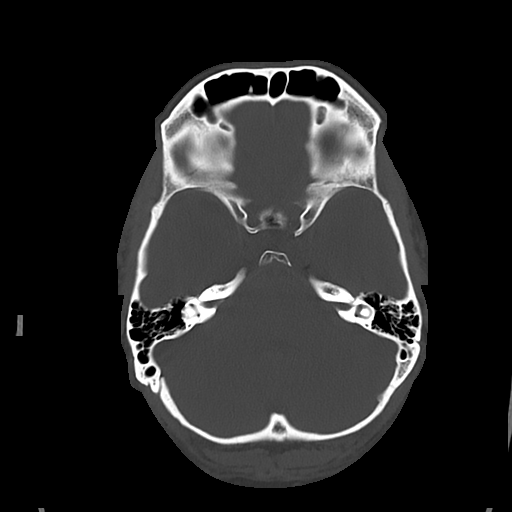
[im 22/74  bone]
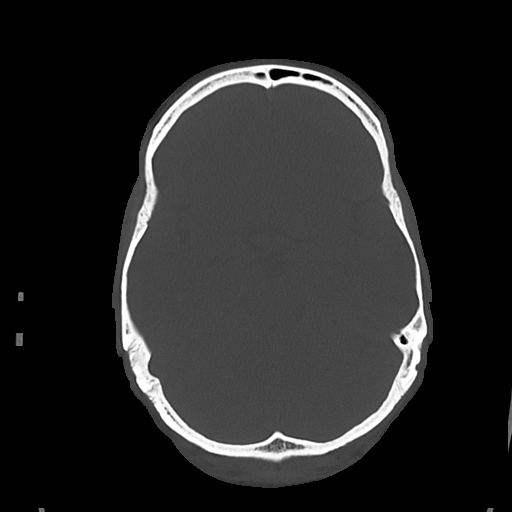

[Series 4: cor soft · coronal · 0.32mm/px · 3 of 69 slices shown]
[im 23/69  brain]
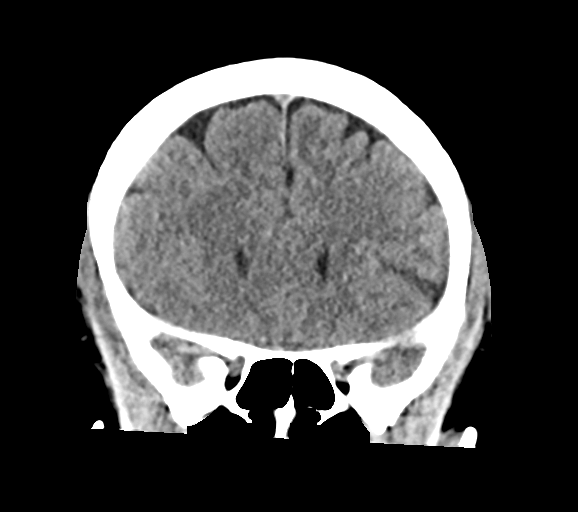
[im 31/69  brain]
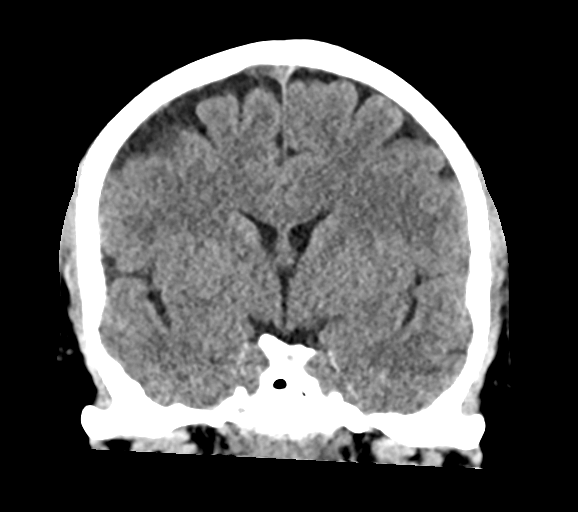
[im 38/69  brain]
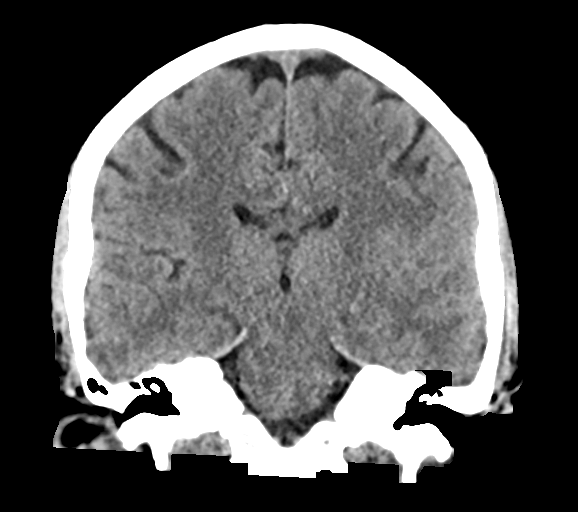

[Series 5: sag soft · sagittal · 0.32mm/px · 3 of 62 slices shown]
[im 21/62  brain]
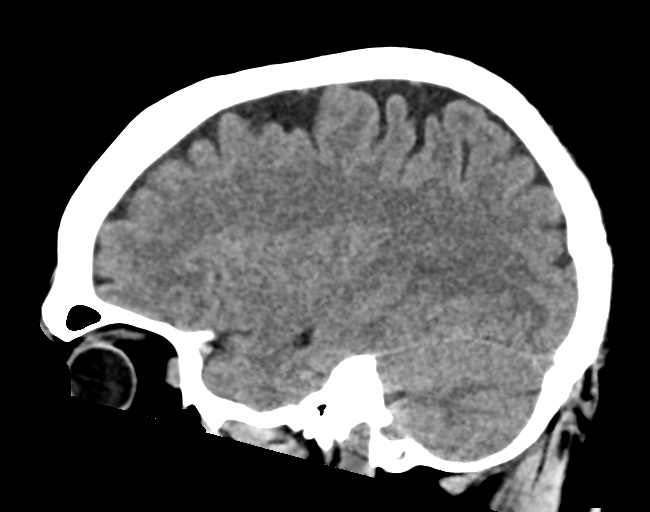
[im 31/62  brain]
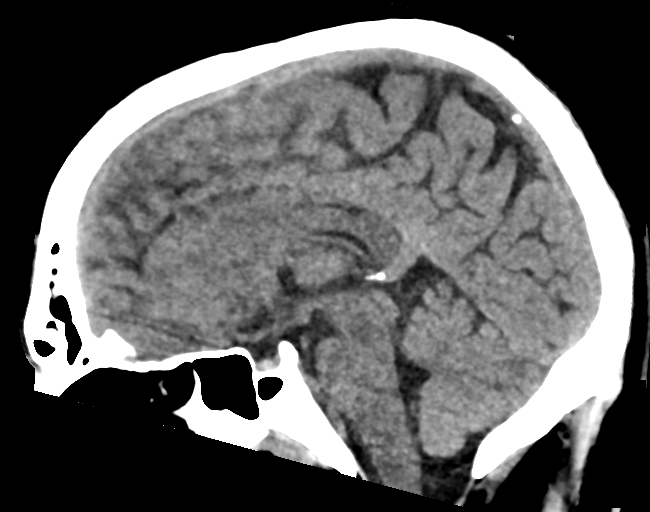
[im 41/62  brain]
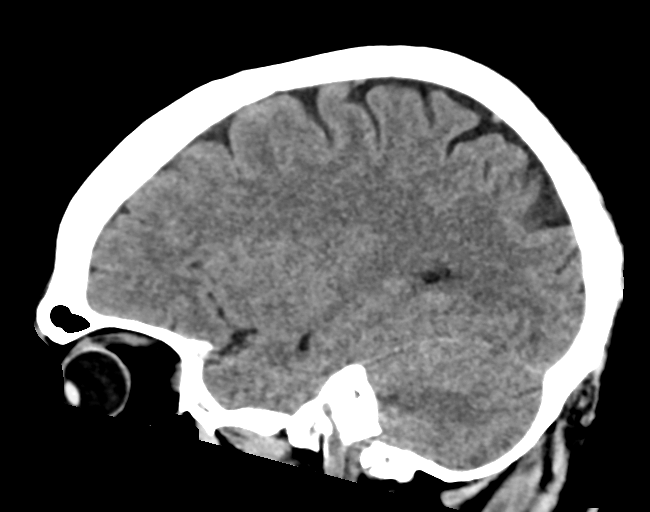

[16 of 47 positions shown; findings below may reference images not displayed]

FINDINGS: Brain: No evidence of acute infarction, hemorrhage, hydrocephalus,
extra-axial collection or mass lesion/mass effect.

Vascular: No hyperdense vessel or unexpected calcification.

Skull: Normal. Negative for fracture or focal lesion.

Sinuses/Orbits: No acute finding.

Other: None.
IMPRESSION: Negative head CT.

## 2022-10-28 ENCOUNTER — Inpatient Hospital Stay
Admission: EM | Admit: 2022-10-28 | Discharge: 2022-11-17 | DRG: 751 | Payer: Medicaid Other | Attending: Student in an Organized Health Care Education/Training Program | Admitting: Student in an Organized Health Care Education/Training Program

## 2022-10-28 DIAGNOSIS — F1090 Alcohol use, unspecified, uncomplicated: Secondary | ICD-10-CM

## 2022-10-28 DIAGNOSIS — K709 Alcoholic liver disease, unspecified: Secondary | ICD-10-CM | POA: Diagnosis present

## 2022-10-28 DIAGNOSIS — F332 Major depressive disorder, recurrent severe without psychotic features: Principal | ICD-10-CM | POA: Diagnosis present

## 2022-10-28 DIAGNOSIS — Z5900 Homelessness unspecified: Secondary | ICD-10-CM

## 2022-10-28 DIAGNOSIS — F419 Anxiety disorder, unspecified: Secondary | ICD-10-CM | POA: Insufficient documentation

## 2022-10-28 DIAGNOSIS — F10220 Alcohol dependence with intoxication, uncomplicated: Secondary | ICD-10-CM | POA: Diagnosis present

## 2022-10-28 DIAGNOSIS — R000 Tachycardia, unspecified: Secondary | ICD-10-CM

## 2022-10-28 DIAGNOSIS — F4310 Post-traumatic stress disorder, unspecified: Secondary | ICD-10-CM | POA: Diagnosis present

## 2022-10-28 DIAGNOSIS — R45851 Suicidal ideations: Secondary | ICD-10-CM | POA: Diagnosis present

## 2022-10-28 DIAGNOSIS — K5900 Constipation, unspecified: Secondary | ICD-10-CM | POA: Diagnosis present

## 2022-10-28 DIAGNOSIS — Z560 Unemployment, unspecified: Secondary | ICD-10-CM

## 2022-10-28 DIAGNOSIS — Z608 Other problems related to social environment: Secondary | ICD-10-CM | POA: Diagnosis present

## 2022-10-28 DIAGNOSIS — K219 Gastro-esophageal reflux disease without esophagitis: Secondary | ICD-10-CM | POA: Diagnosis present

## 2022-10-28 DIAGNOSIS — G478 Other sleep disorders: Secondary | ICD-10-CM | POA: Diagnosis present

## 2022-10-28 DIAGNOSIS — F4010 Social phobia, unspecified: Secondary | ICD-10-CM | POA: Diagnosis present

## 2022-10-28 DIAGNOSIS — R52 Pain, unspecified: Secondary | ICD-10-CM

## 2022-10-28 HISTORY — DX: Depression, unspecified: F32.A

## 2022-10-28 HISTORY — DX: Anxiety disorder, unspecified: F41.9

## 2022-10-28 LAB — COMPREHENSIVE METABOLIC PANEL
ALT: 23 U/L (ref 0–55)
AST (SGOT): 31 U/L (ref 5–41)
Albumin/Globulin Ratio: 1.2 (ref 0.9–2.2)
Albumin: 3.9 g/dL (ref 3.5–5.0)
Alkaline Phosphatase: 77 U/L (ref 37–117)
Anion Gap: 12 (ref 5.0–15.0)
BUN: 16 mg/dL (ref 9–28)
Bilirubin, Total: 0.3 mg/dL (ref 0.2–1.2)
CO2: 22 mEq/L (ref 17–29)
Calcium: 8.5 mg/dL (ref 8.5–10.5)
Chloride: 109 mEq/L (ref 99–111)
Creatinine: 0.7 mg/dL (ref 0.5–1.5)
GFR: >60 mL/min/{1.73_m2} (ref >=60.0)
Globulin: 3.2 g/dL (ref 2.0–3.6)
Glucose: 91 mg/dL (ref 70–100)
Potassium: 4.1 mEq/L (ref 3.5–5.3)
Protein, Total: 7.1 g/dL (ref 6.0–8.3)
Sodium: 143 mEq/L (ref 135–145)

## 2022-10-28 LAB — LAB USE ONLY - URINALYSIS WITH REFLEX TO MICROSCOPIC EXAM AND CULTURE
Urine Bilirubin: NEGATIVE
Urine Blood: NEGATIVE
Urine Glucose: NEGATIVE
Urine Leukocyte Esterase: NEGATIVE
Urine Nitrite: NEGATIVE
Urine Specific Gravity: 1.024 (ref 1.001–1.035)
Urine Urobilinogen: NORMAL mg/dL (ref 0.2–2.0)
Urine pH: 5.5 (ref 5.0–8.0)

## 2022-10-28 LAB — ECG 12-LEAD
Atrial Rate: 103 {beats}/min
P Axis: 47 degrees
P-R Interval: 148 ms
Q-T Interval: 334 ms
QRS Duration: 98 ms
QTC Calculation (Bezet): 437 ms
R Axis: 49 degrees
T Axis: 37 degrees
Ventricular Rate: 103 {beats}/min

## 2022-10-28 LAB — LAB USE ONLY - CBC WITH DIFFERENTIAL
Absolute Basophils: 0.03 10*3/uL (ref 0.00–0.08)
Absolute Eosinophils: 0.03 10*3/uL (ref 0.00–0.44)
Absolute Immature Granulocytes: 0.02 10*3/uL (ref 0.00–0.07)
Absolute Lymphocytes: 1.93 10*3/uL (ref 0.42–3.22)
Absolute Monocytes: 0.67 10*3/uL (ref 0.21–0.85)
Absolute Neutrophils: 3.24 10*3/uL (ref 1.10–6.33)
Absolute nRBC: 0 10*3/uL (ref <=0.00)
Basophils %: 0.5 %
Eosinophils %: 0.5 %
Hematocrit: 39.7 % (ref 37.6–49.6)
Hemoglobin: 13.3 g/dL (ref 12.5–17.1)
Immature Granulocytes %: 0.3 %
Lymphocytes %: 32.6 %
MCH: 31.3 pg (ref 25.1–33.5)
MCHC: 33.5 g/dL (ref 31.5–35.8)
MCV: 93.4 fL (ref 78.0–96.0)
MPV: 9 fL (ref 8.9–12.5)
Monocytes %: 11.3 %
Neutrophils %: 54.8 %
Platelet Count: 267 10*3/uL (ref 142–346)
Preliminary Absolute Neutrophil Count: 3.24 10*3/uL (ref 1.10–6.33)
RBC: 4.25 10*6/uL (ref 4.20–5.90)
RDW: 16 % — ABNORMAL HIGH (ref 11–15)
WBC: 5.92 10*3/uL (ref 3.10–9.50)
nRBC %: 0 /100 WBC (ref <=0.0)

## 2022-10-28 LAB — URINE DRUGS OF ABUSE SCREEN
Urine Amphetamine Screen: NEGATIVE
Urine Barbituate Screen: NEGATIVE
Urine Benzodiazepine Screen: POSITIVE — AB
Urine Cannabinoid Screen: NEGATIVE
Urine Cocaine Screen: NEGATIVE
Urine Fentanyl Screen: NEGATIVE
Urine Opiate Screen: NEGATIVE
Urine PCP Screen: NEGATIVE

## 2022-10-28 LAB — PHOSPHORUS: Phosphorus: 2.7 mg/dL (ref 2.3–4.7)

## 2022-10-28 LAB — LAB USE ONLY - URINE GRAY CULTURE HOLD TUBE

## 2022-10-28 LAB — MAGNESIUM: Magnesium: 2.2 mg/dL (ref 1.6–2.6)

## 2022-10-28 LAB — TSH: TSH: 1.02 u[IU]/mL (ref 0.35–4.94)

## 2022-10-28 LAB — SALICYLATE LEVEL: Salicylate: <5 mg/dL — ABNORMAL LOW (ref 15.0–30.0)

## 2022-10-28 LAB — ACETAMINOPHEN LEVEL: Acetaminophen Level: <7 ug/mL — ABNORMAL LOW (ref 10–30)

## 2022-10-28 LAB — SARS-COV-2 (COVID-19) RNA, PCR (LIAT): SARS-CoV-2 (COVID-19) RNA: NOT DETECTED

## 2022-10-28 LAB — ETHANOL (ALCOHOL) LEVEL: Alcohol: 135 mg/dL — ABNORMAL HIGH

## 2022-10-28 NOTE — ED Notes (Signed)
TW uploaded prescreen @ [id number]

## 2022-10-28 NOTE — ED Notes (Addendum)
Report called to Khristina RN on PS6N. Questions answered.

## 2022-10-28 NOTE — ED Notes (Signed)
PT accepted to [facility] @ 2308    [facility] [place] has been notified

## 2022-10-28 NOTE — Progress Notes (Addendum)
PSYCHIATRIC EMERGENCY ROOM  CHART REVIEW      Patient name:  Gary [name],[name]  Date of birth:  August 07, 1980  Age:  42 y.o.  MRN:  [id number]  CSN:  [id number]  Patient Location: [facility] EMERGENCY DEPT   Date of consultation:  10/28/2022  Attending/Referring Physician:  Faythe Dingwall, MD  Consulting Advanced Practice Provider: Hyacinth Meeker, DNP FNP   Reason for consultation: Recommendations regarding management management and/or disposition.     Legal status of patient:  []  TDO  [x]  CSB VOL  []  VOL    Meds/behavior in Past 24 hours:  Agitation/aggression:No  Emergency PRNs given?:  ED Medication Orders (From admission, onward)      None           More than one restraint: No  Most recent [place] Violence Risk Level:0    Summation/Impression:   Patient is 42 y/o male presenting under CSB Vol for Suicidal ideation with intent and plan to jump off bridge in context of multiple stressors in life recently. Also noted to be intoxicated during evaluation.  Noted to have hx of anxiety, depression and alcohol use disorder.     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       Recommendations:  Medications: Utilize PRN for mood stability   Safety: 1 on 1 sitter    Disposition: Accepted to FFX Mood     Current Medications  Home Medications       Med List Status: In Progress Set By: Ellard Artis, Jasmine December, RN at 10/28/2022  8:47 PM              ibuprofen (ADVIL) 800 MG tablet     Take 1 tablet (800 mg) by mouth 3 (three) times daily as needed     omeprazole (PriLOSEC) 40 MG capsule     Take 1 capsule (40 mg) by mouth daily             Vitals:  Visit Vitals  BP 123/77   Pulse (!) 105   Temp 98.2 F (36.8 C) (Oral)   Resp 16   Ht 1.702 m (5\' 7" )   Wt 68 kg (150 lb)   SpO2 95%   BMI 23.49 kg/m        Labs:   Results       Procedure Component Value Units Date/Time    Urinalysis with Reflex to Microscopic Exam and Culture [[id number]]  (Abnormal) Collected: 10/28/22 1917    Specimen: Urine,  Clean Catch Updated: 10/28/22 2101    Narrative:      The following orders were created for panel order Urinalysis with Reflex to Microscopic Exam and Culture.  Procedure                               Abnormality         Status                     ---------                               -----------         ------                     Urinalysis with Reflex t.Marland KitchenMarland Kitchen[[id number]]  Abnormal  Final result               Urine [employer] .Marland KitchenMarland Kitchen[[id number]]                      Final result                 Please view results for these tests on the individual orders.    Urine [employer] Tube [[id number]] Collected: 10/28/22 1917    Specimen: Urine, Clean Catch Updated: 10/28/22 2101     Extra Tube Hold for add-ons.    Urinalysis with Reflex to Microscopic Exam and Culture [[id number]]  (Abnormal) Collected: 10/28/22 1917    Specimen: Urine, Clean Catch Updated: 10/28/22 2046     Urine Color Yellow     Urine Clarity Clear     Urine Specific Gravity 1.024     Urine pH 5.5     Urine Leukocyte Esterase Negative     Urine Nitrite Negative     Urine Protein 10= Trace     Urine Glucose Negative     Urine Ketones 10= 1+ mg/dL      Urine Urobilinogen Normal mg/dL      Urine Bilirubin Negative     Urine Blood Negative     RBC, UA 0-2 /hpf      Urine WBC 0-5 /hpf      Urine Squamous Epithelial Cells 0-5 /hpf      Urine Mucus Present    TSH [[id number]]  (Normal) Collected: 10/28/22 1915    Specimen: Blood, Venous Updated: 10/28/22 2031     TSH 1.02 uIU/mL     Urine Drugs of Abuse Screen [[id number]]  (Abnormal) Collected: 10/28/22 1917    Specimen: Urine, Clean Catch Updated: 10/28/22 2004     Urine Amphetamine Screen Negative     Urine Barbituate Screen Negative     Urine Benzodiazepine Screen Positive     Urine Cannabinoid Screen Negative     Urine Cocaine Screen Negative     Urine Fentanyl Screen Negative     Urine Opiate Screen Negative     Urine PCP Screen Negative    Ethanol (Alcohol) Level [[id number]]  (Abnormal)  Collected: 10/28/22 1915    Specimen: Blood, Venous Updated: 10/28/22 2003     Alcohol 135 mg/dL     Acetaminophen Level [[id number]]  (Abnormal) Collected: 10/28/22 1915    Specimen: Blood, Venous Updated: 10/28/22 2003     Acetaminophen Level <7 ug/mL     Magnesium [[id number]]  (Normal) Collected: 10/28/22 1915    Specimen: Blood, Venous Updated: 10/28/22 2003     Magnesium 2.2 mg/dL     Phosphorus [[id number]]  (Normal) Collected: 10/28/22 1915    Specimen: Blood, Venous Updated: 10/28/22 2003     Phosphorus 2.7 mg/dL     Comprehensive Metabolic Panel [[id number]]  (Normal) Collected: 10/28/22 1915    Specimen: Blood, Venous Updated: 10/28/22 2003     Glucose 91 mg/dL      BUN 16 mg/dL      Creatinine 0.7 mg/dL      Sodium 301 mEq/L      Potassium 4.1 mEq/L      Chloride 109 mEq/L      CO2 22 mEq/L      Calcium 8.5 mg/dL      Anion Gap 60.1     GFR >60.0 mL/min/1.73 m2      AST (SGOT) 31 U/L      ALT 23  U/L      Alkaline Phosphatase 77 U/L      Albumin 3.9 g/dL      Protein, Total 7.1 g/dL      Globulin 3.2 g/dL      Albumin/Globulin Ratio 1.2     Bilirubin, Total 0.3 mg/dL     Salicylate Level [[id number]]  (Abnormal) Collected: 10/28/22 1915    Specimen: Blood, Venous Updated: 10/28/22 2003     Salicylate <5.0 mg/dL     CBC with Differential [[id number]]  (Abnormal) Collected: 10/28/22 1915    Specimen: Blood, Venous Updated: 10/28/22 1959    Narrative:      The following orders were created for panel order CBC with Differential.  Procedure                               Abnormality         Status                     ---------                               -----------         ------                     CBC with Differential[[id number]]        Abnormal            Final result                 Please view results for these tests on the individual orders.    CBC with Differential [[id number]]  (Abnormal) Collected: 10/28/22 1915    Specimen: Blood, Venous Updated: 10/28/22 1959     WBC 5.92 x10 3/uL      Hemoglobin 13.3  g/dL      Hematocrit 29.5 %      Platelet Count 267 x10 3/uL      MPV 9.0 fL      RBC 4.25 x10 6/uL      MCV 93.4 fL      MCH 31.3 pg      MCHC 33.5 g/dL      RDW 16 %      nRBC % 0.0 /100 WBC      Absolute nRBC 0.00 x10 3/uL      Preliminary Absolute Neutrophil Count 3.24 x10 3/uL      Neutrophils % 54.8 %      Lymphocytes % 32.6 %      Monocytes % 11.3 %      Eosinophils % 0.5 %      Basophils % 0.5 %      Immature Granulocytes % 0.3 %      Absolute Neutrophils 3.24 x10 3/uL      Absolute Lymphocytes 1.93 x10 3/uL      Absolute Monocytes 0.67 x10 3/uL      Absolute Eosinophils 0.03 x10 3/uL      Absolute Basophils 0.03 x10 3/uL      Absolute Immature Granulocytes 0.02 x10 3/uL              EKG:  EKG Results       Procedure Component Value Units Date/Time    ECG 12 lead (Electrocardiogram) [[id number]] Collected: 10/28/22 1740     Updated: 10/28/22 1753     Ventricular Rate 103  BPM      Atrial Rate 103 BPM      P-R Interval 148 ms      QRS Duration 98 ms      Q-T Interval 334 ms      QTC Calculation (Bezet) 437 ms      P Axis 47 degrees      R Axis 49 degrees      T Axis 37 degrees      IHS MUSE NARRATIVE AND IMPRESSION --     SINUS TACHYCARDIA  OTHERWISE NORMAL ECG  NO PREVIOUS ECGS AVAILABLE  Confirmed by Neomia Dear MD, Galo (31) on 10/28/2022 5:53:08 PM      Narrative:      SINUS TACHYCARDIA  OTHERWISE NORMAL ECG  NO PREVIOUS ECGS AVAILABLE  Confirmed by Neomia Dear MD, Yuniel (31) on 10/28/2022 5:53:08 PM             Radiology:   CT Cervical Spine WO Contrast    Result Date: 10/18/2022  IMPRESSION: No evident acute injury. END IMPRESSION. I have personally reviewed the images and agree with and/or edited the report.    CT Head WO Contrast    Result Date: 10/18/2022  IMPRESSION: No evident acute injury. END IMPRESSION. I have personally reviewed the images and agree with and/or edited the report.    CT Abdomen Pelvis W IV/ WO PO Cont    Result Date: 10/13/2022  IMPRESSION: 1.  A 1.6 cm right nephrolithiasis with moderate dilation  of the mid and lower pole calyces. 2.  No abscess within the abdomen/pelvis. I have personally reviewed the images and agree with and/or edited the report. Report Electronically Signed: 10/13/2022 4:31 AM Sagheer Ahmed        I spent a total of 20 minutes reviewing the records, communicating with Dr.Mclauhglin and documenting my findings and recommendations.      Signed: October 28, 2022  Hyacinth Meeker, DNP FNP   [facility]

## 2022-10-28 NOTE — EDIE (Signed)
[facility]?NOTIFICATION?10/28/2022 17:01?Gary [name], Gary [name]?MRN: [id number]    Criteria Met      3 Different Facilities in 90 Days    5 ED Visits in 12 Months    History of Suicide Ideation or Self-Harm (12 mo.)    Security and Safety  No Security Events were found.  ED Care Guidelines  There are currently no ED Care Guidelines for this patient. Please check your facility's medical records system.        Prescription Monitoring Program  Narx Score not available at this time.    E.D. Visit Count (12 mo.)  Facility Visits   [facility] 4   [facility] 1   [facility] 1   [facility] 1   [facility] 1   [facility] [place] 1   Total 9   Note: Visits indicate total known visits.     Recent Emergency Department Visit Summary  Date Facility [facility] Type Diagnoses or Chief Complaint    Oct 28, 2022  [facility]  [place]  [place]  Emergency      Psychiatric Evaluation      Suicidal      triage      Oct 28, 2022  [facility]  [place]  [place]  Emergency      flank pain      Oct 21, 2022  [facility]  [place]  Emergency      Other specified abnormal findings of blood chemistry      Hypocalcemia      Suicidal ideations      Depression, unspecified      Calculus of kidney      Other microscopic hematuria      Suicidal Ideation      [facility]      Oct 19, 2022  [facility]  [place] .  [place]  Emergency      Depression      [place] depression      Oct 18, 2022  [facility]  [place]  Emergency      Alcohol use, unspecified with intoxication, uncomplicated      Alcohol Intoxication      SUICIDAL AND INTOXICATED.      SUDICIAL AND INTOXICATED.      Oct 16, 2022  [facility]  [place].  [place]  Emergency      Alcohol use, unspecified with intoxication, uncomplicated      Alcohol Intoxication      Pain, Flank      side pain      Oct 14, 2022  [facility].  MD  Emergency      Other specified personal risk factors, not elsewhere classified      Chest pain, unspecified       Unspecified renal colic      Abdominal Pain      lightheadedness and side pain      Oct 12, 2022  [facility].  MD  Emergency      Calculus of kidney      Other      ABNORMAL LABS, PAINFUL DIARRHEA      Sep 22, 2022  [facility] Rhett Bannister  MD  Emergency      Alcohol use, unspecified with intoxication, uncomplicated      Suicidal ideations      Cannabis use, unspecified, uncomplicated  Suicidal Ideation      PA830 Depression; Chest Pain; Suicidal Thoughts        Recent Inpatient Visit Summary  No Recent Inpatient Visits were found.  Care Team  No Care Team was found.  [facility]  This patient has registered at the [facility] Emergency Department  For more information visit: [email or web address]     PLEASE NOTE:     1.   Any care recommendations and other clinical information are provided as guidelines or for historical purposes only, and providers should exercise their own clinical judgment when providing care.    2.   You may only use this information for purposes of treatment, payment or health care operations activities, and subject to the limitations of applicable [facility] Policies.    3.   You should consult directly with the organization that provided [name] care guideline or other clinical history with any questions about additional information or accuracy or completeness of information provided.    42 CFR Part 2 or state confidentiality law prohibits unauthorized disclosure of these records. This information has been disclosed to you from the records whose confidentiality is or may be protected by state law. State law prohibits you from making any further disclosure of it without the specific written authorization of the person to whom it pertains, or as otherwise permitted by state law. You may view an electronic version of the patient's consent in the portal.   ? 2024 [facility] - [email or web address]

## 2022-10-28 NOTE — ED Provider Notes (Signed)
[facility] EMERGENCY DEPARTMENT  ATTENDING PHYSICIAN HISTORY AND PHYSICAL EXAM     Patient Name: Gary [name],[name]  Department:[facility] EMERG[facility]Y DEPT  Encounter Date:  10/28/2022  Attending Physician: Faythe Dingwall, MD   Age: 42 y.o. male  Patient Room: N 34/N 76  PCP: Pcp, None, MD           Diagnosis/Disposition:     Final diagnoses:   Suicidal ideation   Alcohol dependence with uncomplicated intoxication       ED Disposition       ED Disposition   Admit    Condition   --    Date/Time   Thu Oct 28, 2022 10:57 PM    Comment   Admitting Physician: Farris Has [[id number]]   Service:: Behavioral Health [151]   Has patient been medically cleared for placement on BH/CATS units?: Yes   Is patient on oxygen?: No   Is patient pregnant?: No   Estimated Length of Stay: > or = to 2 midnights   Tentative Discharge Plan?: Home or Self Care [1]   Does patient need telemetry?: No                 Follow-Up Providers (if applicable)    No follow-up provider specified.     New Prescriptions    No medications on file           Medical Decision Making:       Gary Moline. is a 42 y.o. male presenting for suicidal ideation  Vitals reviewed by me = stable.   Nursing notes reviewed by me agree with    Initial Differential Diagnosis:  Initial differential diagnosis to include but not limited to: Suicidal ideation, depression, anxiety    Plan:  Patient deemed medically cleared.  Specifically with concern mention by CSB about patient and planning for outpatient surgical procedure for kidney stone, he has no acute kidney injury, hematuria, pyuria, or evidence of infected nephrolithiasis or intractable pain that would require urgent or emergent surgical intervention or urologic evaluation.  Therefore no acute medical complaints at this time and patient again deemed medically cleared for psychiatric hospitalization  I discussed with psychiatric team who is agreeable with plan for hospitalization         Final Impression:  Depression,  suicidal ideation alcohol dependence  The patient was admitted and handed off to [facility], Gary L. We discussed all aspects of the work-up that had been completed in the ED, any pending studies/therapies, and all clinical decision making at the time care was handed off.       Medical Decision Making  Risk  Decision regarding hospitalization.                            History of Presenting Illness:     Nursing Triage note: Ambulatory to ED for "depression and feeling suicidal" for weeks. Reports plan to jump off bridge today, endorses intent to do that. Denies HI or hallucinations. Reports drinking 1/5 of vodka today 2 hours PTA, endorse daily use. PMH depression, anxiety. Denies taking meds for mental health, states he is also here for kidney stone removal, but mainly mental health.  Chief complaint: Suicidal and Psychiatric Evaluation    [name] is a 42 y.o. male with past medical history of depression and anxiety presenting for one day of suicidal ideation with a plan.    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 She reports that he has agreed to stay voluntarily, but that [facility] will TDO him if he attempts to leave.    Upon arrival at this ED the patient reports suicidal ideation with a plan to jump off of a bridge. He states that he arrived at the bridge, but did not attempt to climb or jump. He reports a history of suicidal ideation with attempts. He endorses daily EtOH use, stating that he drinks "until I pass out."  He denies history of withdrawal seizures, but notes episodes of diarrhea and tremors secondary to EtOH withdrawal. Last EtOH use was 4 hours prior to arrival. He otherwise denies HI or AVH.    Of note, the patient was recently diagnosed with kidney stones and has a pending surgical intervention. CT obtained [facility] [employer] reviewed on care everywhere shows a 1.6cm nephrolithiasis with moderate  dilation to the mid and lower poles. Upon arrival at this ED the patient endorses ongoing, intermittent L-sided flank pain brought on by urination.  No severe pain, gross hematuria, gross pyuria, inability to tolerate oral intake, fevers, or other concerning symptoms        Review of Systems:  Physical Exam:     Review of Systems    Positive and negative ROS per above and in HPI. All other systems reviewed and negative.     Pulse (!) 117  BP 123/77  Resp 16  SpO2 95 %  Temp 98.2 F (36.8 C)     General appearance - alert, well appearing, and in no distress  Mental status - alert, oriented to person, place, and time  Eyes - pupils equal and reactive, extraocular eye movements intact  Ears - bilateral TM's and external ear canals normal  Nose - normal and patent, no erythema, discharge or polyps  Mouth - mucous membranes moist, pharynx normal without lesions  Neck - supple, no significant adenopathy  Lymphatics - no palpable lymphadenopathy, no hepatosplenomegaly  Chest - clear to auscultation, no wheezes, rales or rhonchi, symmetric air entry  Heart - normal rate (sinus tachycardia on arrival), regular rhythm, normal S1, S2, no murmurs, rubs, clicks or gallops  Abdomen - soft, nontender, nondistended, no masses or organomegaly  Back exam - full range of motion, no tenderness, palpable spasm or pain on motion  Neurological - alert, oriented, normal speech, no focal findings or movement disorder noted  Musculoskeletal - no joint tenderness, deformity or swelling  Extremities - peripheral pulses normal, no pedal edema, no clubbing or cyanosis  Skin - normal coloration, no rashes, no suspicious skin lesions noted        Interpretations, Clinical Decision Tools and Critical Care:       EKG: I reviewed and Independently interpreted the patient's EKG as sinus tachycardia at 103. Normal intervals. Nonspecific ST changes most prominent in lead II, AVL and V2. No reciprocal ST changes. No chest pain or shortness of breath  at the time of this EKG. No prior EKG available for comparison.       Point of Care Ultrasound results if available =  No results found for this or any previous visit.              Procedures:   Procedures      Attestations:     Scribe Attestation: I was acting as a Neurosurgeon for [employer], Alton Revere, MD on [place],Muzamil ABBOT JR.  Joya Martyr  I am the first provider for this patient and I personally performed the services documented. Joya Martyr is scribing for me on [employer],Rodney ABBOT TannerMarland Kitchen This note and the patient instructions accurately reflect work and decisions made by me.  Geovanie Winnett, Alton Revere, MD    Documentation Notes:  Parts of this note were generated by the Epic EMR system/ Dragon speech recognition and may contain inherent errors or omissions not intended by the user. Grammatical errors, random word insertions, deletions, pronoun errors and incomplete sentences are occasional consequences of this technology due to software limitations. Not all errors are caught or corrected.  My documentation is often completed after the patient is no longer under my clinical care. In some cases, the Epic EMR may pull updated results into the above documentation which may not reflect all results or information that were available to me at the time of my medical decision making.   If there are questions or concerns about the content of this note or information contained within the body of this dictation they should be addressed directly with the author for clarification.                  Faythe Dingwall, MD  10/28/22 [phone]

## 2022-10-28 NOTE — ED Notes (Signed)
Sitter form faxed

## 2022-10-28 NOTE — ED Notes (Signed)
TW received a call from Fowlkes at [place] [facility] stating that this pt is a CSB Voluntary from them. He was already evaluated at [facility] and agreed to stay voluntarily, however, if he changes his mind and tries to leave they will TDO him.     Misty Stanley stated that the pt had several medical issues that may need to be addressed. She stated that the pt told her that he has a kidney stone that needs to be removed. And that he drank a lot of vodka today, and that he could feel himself starting to detox from alcohol.     Misty Stanley is working on her prescreen and will fax it to the [facility] team in the ED when it's complete.

## 2022-10-28 NOTE — ED Notes (Signed)
I have assisted in the ordering of diagnostic studies necessary to expedite care. I am not the primary provider for this patient. Please refer to the provider/reassessment/disposition notes for further information about this patient’s emergency department course.

## 2022-10-28 NOTE — ED Notes (Signed)
Sitter present

## 2022-10-28 NOTE — ED Notes (Signed)
Chief Complaint   Patient presents with    Suicidal    Psychiatric Evaluation        Patient states: Presents to the ER for suicidal ideation w/ plan and intent : pt states he has had multiple stressors in his life recently, states he was walking by "[place]" and planned on jumping off. Was able to seek help. States he does not have support systems in place. Hx of anxiety, depression, and alcohol use. Last drink was a pint of vodka approximately 4 hours ago. States he typically drinks until he "passes out". Pt calm, cooperative, A&Ox4, speaking in full sentences, answering questions appropriately. Reports homelessness, new to the area moved from [place] this past year.      Vitals:    10/28/22 1746   BP: 123/77   Pulse: (!) 105   Resp: 16   Temp: 98.2 F (36.8 C)   SpO2: 95%        Past Medical History:   Diagnosis Date    Anxiety     Depression        Allergies   Allergen Reactions    Abilify [Aripiprazole]     Effexor [Venlafaxine]     Haldol [Haloperidol]     Lithium     Paxil [Paroxetine]     Prozac [Fluoxetine]     Vivitrol [Naltrexone]

## 2022-10-29 DIAGNOSIS — F32A Depression, unspecified: Secondary | ICD-10-CM

## 2022-10-29 LAB — LIPID PANEL
Cholesterol / HDL Ratio: 2.8 Index
Cholesterol: 165 mg/dL (ref <=199)
HDL: 60 mg/dL (ref >=40)
LDL Calculated: 89 mg/dL (ref 0–129)
Triglycerides: 79 mg/dL (ref 34–149)
VLDL Calculated: 16 mg/dL (ref 10–40)

## 2022-10-29 LAB — HEMOGLOBIN A1C
Average Estimated Glucose: 93.9 mg/dL
Hemoglobin A1C: 4.9 % (ref 4.6–5.6)

## 2022-10-29 MED ORDER — DIPHENHYDRAMINE HCL 50 MG/ML IJ SOLN
50.0000 mg | Freq: Once | INTRAMUSCULAR | Status: DC | PRN
Start: 2022-10-29 — End: 2022-11-17

## 2022-10-29 MED ORDER — ACAMPROSATE CALCIUM 333 MG PO TBEC
666.0000 mg | DELAYED_RELEASE_TABLET | Freq: Three times a day (TID) | ORAL | Status: DC
Start: 2022-10-30 — End: 2022-11-10
  Administered 2022-10-30 – 2022-11-10 (×32): 666 mg via ORAL
  Filled 2022-10-29 (×37): qty 2

## 2022-10-29 MED ORDER — QUETIAPINE FUMARATE 25 MG PO TABS
50.0000 mg | ORAL_TABLET | Freq: Every evening | ORAL | Status: DC | PRN
Start: 2022-10-29 — End: 2022-11-02
  Administered 2022-10-29 – 2022-10-30 (×2): 50 mg via ORAL
  Filled 2022-10-29 (×3): qty 2

## 2022-10-29 MED ORDER — THIAMINE (VITAMIN B1) 100 MG PO TABS (WRAP)
100.0000 mg | ORAL_TABLET | Freq: Every day | ORAL | Status: DC
Start: 2022-10-29 — End: 2022-11-17
  Administered 2022-10-29 – 2022-11-17 (×20): 100 mg via ORAL
  Filled 2022-10-29 (×20): qty 1

## 2022-10-29 MED ORDER — LORAZEPAM 1 MG PO TABS
1.0000 mg | ORAL_TABLET | Freq: Four times a day (QID) | ORAL | Status: DC | PRN
Start: 2022-11-01 — End: 2022-11-01

## 2022-10-29 MED ORDER — DIPHENHYDRAMINE HCL 25 MG PO CAPS
50.0000 mg | ORAL_CAPSULE | Freq: Once | ORAL | Status: DC | PRN
Start: 2022-10-29 — End: 2022-11-17

## 2022-10-29 MED ORDER — OLANZAPINE 10 MG PO TABS
10.0000 mg | ORAL_TABLET | Freq: Two times a day (BID) | ORAL | Status: DC | PRN
Start: 2022-10-29 — End: 2022-11-05

## 2022-10-29 MED ORDER — LORAZEPAM 1 MG PO TABS
2.0000 mg | ORAL_TABLET | ORAL | Status: DC | PRN
Start: 2022-10-29 — End: 2022-10-30

## 2022-10-29 MED ORDER — LORAZEPAM 1 MG PO TABS
1.0000 mg | ORAL_TABLET | ORAL | Status: DC | PRN
Start: 2022-10-31 — End: 2022-11-01

## 2022-10-29 MED ORDER — ACETAMINOPHEN 325 MG PO TABS
650.0000 mg | ORAL_TABLET | Freq: Four times a day (QID) | ORAL | Status: DC | PRN
Start: 2022-10-29 — End: 2022-11-17
  Administered 2022-11-09 – 2022-11-17 (×9): 650 mg via ORAL
  Filled 2022-10-29 (×9): qty 2

## 2022-10-29 MED ORDER — VITAMINS/MINERALS PO TABS
1.0000 | ORAL_TABLET | Freq: Every day | ORAL | Status: DC
Start: 2022-10-29 — End: 2022-11-17
  Administered 2022-10-29 – 2022-11-17 (×20): 1 via ORAL
  Filled 2022-10-29 (×20): qty 1

## 2022-10-29 MED ORDER — FOLIC ACID 1 MG PO TABS
1.0000 mg | ORAL_TABLET | Freq: Every day | ORAL | Status: DC
Start: 2022-10-29 — End: 2022-11-17
  Administered 2022-10-29 – 2022-11-17 (×20): 1 mg via ORAL
  Filled 2022-10-29 (×20): qty 1

## 2022-10-29 MED ORDER — ALUM & MAG HYDROXIDE-SIMETH 200-200-20 MG/5ML PO SUSP
30.0000 mL | Freq: Four times a day (QID) | ORAL | Status: DC | PRN
Start: 2022-10-29 — End: 2022-11-17
  Administered 2022-11-14: 30 mL via ORAL
  Filled 2022-10-29: qty 30

## 2022-10-29 MED ORDER — HYDROXYZINE PAMOATE 25 MG PO CAPS
25.0000 mg | ORAL_CAPSULE | ORAL | Status: DC | PRN
Start: 2022-10-29 — End: 2022-11-01
  Administered 2022-10-29 – 2022-11-01 (×3): 25 mg via ORAL
  Filled 2022-10-29 (×3): qty 1

## 2022-10-29 MED ORDER — LORAZEPAM 1 MG PO TABS
2.0000 mg | ORAL_TABLET | ORAL | Status: DC | PRN
Start: 2022-10-30 — End: 2022-10-31

## 2022-10-29 MED ORDER — IBUPROFEN 400 MG PO TABS
800.0000 mg | ORAL_TABLET | Freq: Four times a day (QID) | ORAL | Status: DC | PRN
Start: 2022-10-29 — End: 2022-11-13
  Administered 2022-10-29 – 2022-11-12 (×17): 800 mg via ORAL
  Filled 2022-10-29 (×17): qty 2

## 2022-10-29 MED ORDER — MIRTAZAPINE 15 MG PO TABS
15.0000 mg | ORAL_TABLET | Freq: Every evening | ORAL | Status: DC
Start: 2022-10-29 — End: 2022-10-30
  Administered 2022-10-29: 15 mg via ORAL
  Filled 2022-10-29: qty 1

## 2022-10-29 MED ORDER — LORAZEPAM 1 MG PO TABS
1.0000 mg | ORAL_TABLET | Freq: Three times a day (TID) | ORAL | Status: DC | PRN
Start: 2022-11-02 — End: 2022-11-01

## 2022-10-29 NOTE — Progress Notes (Signed)
SW provided Pt with a list of Homeless Shelters in [place].       [employer] Contact number  [phone]).  And  Homeless shelter Hotline #  617-715-3386

## 2022-10-29 NOTE — Plan of Care (Addendum)
Problem: IP Suicidal Ideation  Description: Gary [name] endorses suicidal ideation with plan to jump off bridge  Goal: LTG: Gary [name] will exhibit compliance with therapy  Outcome: Not Progressing  Goal: STG: Gary [name] will not engage in self-injurious activities  Outcome: Progressing  Gary [name] has not engaged in self-harm behavior  Goal: STG: Gary [name] will identify 3 internal coping strategies  Outcome: Not Progressing         Gary [name] was A+Ox4, presents with blunted affect and pressured speech. Endorses passive SI with no intent, denies HI/AVH. BVC is 0. Pt encouraged to come to staff if thoughts or urges to harm self or others increases. Pt is calm and cooperative with Clinical research associate, able to answer questions and maintain eye contact. Pt describes his mood as "depressed".  Reports difficulty sleeping and decreased appetite.  Gary [name] rated his depression as 8/10 and anxiety as 8/10. Gary [name] has no scheduled medication, did not request for prn. Gary [name] was briefly visible in the milieu, interacted with staff appropriately. Pt was assessed for alcohol withdrawal symptoms every 2 hours, not scoring. Q15 min safety checks maintained. Will continue to monitor and ensure safety.    Pt observed in bed, eyes closed, no apparent distress noted, slept for 4 hours.       "I, Gary [name], hereby attest that I have been made aware of and reviewed the following for this patient:    - Biopsychosocial assessment;  - Diagnoses;  - Nursing needs and medical support, if applicable; and  - Treatment Plan Goals, Objectives and Strategies/Interventions.    I am involved in the care of this patient within my scope of practice and assigned tasks. I approve the treatment plan and this attestation is in lieu of a signature on the document."

## 2022-10-29 NOTE — H&P (Signed)
ADMISSION HISTORY AND PHYSICAL EXAM        Date Time: 10/29/22 9:30 AM  Patient Name: Gary [name],[name]  Attending Physician: Farris Has, MD    Assessment:   Gary Och. is a 42 y.o. Caucasian, homeless, single unemployed male with a medical history of nephrolith (renal calculus) and a psychiatric history of anxiety, depression, alcohol use disorder admitted due to worsening of depressive symptoms and suicidal ideation with a plan of jumping off of a bridge.    On assessment today patient was found in his room pacing.  He is calm and cooperative for assessment, alert and oriented x 4.  He reported history of alcohol drinking last drink was about 24 to 28 hours ago.  He reported withdrawal symptoms which includes sweating, tremor, chills.  Per report patient has history of TDs and has been on rehab.  He reported a CT scan done back in June has kidney stone and he has pain.  Usually 800 mg of Motrin help.  But since he got did not get it here has a pain about 7/10.  He reported trying multiple medications for alcohol withdrawal in the past and he mentioned he is allergic to Vivitrol.  He denied any other physical symptoms including feeling chills numbness nausea or vomiting but pain.  He requested 800 mg of more Motrin for pain.  Patient reported history of GERD due to alcohol and that he has been taking omeprazole but never had taking it consistently and has not had symptoms at this time.    Plan:   #SI  #Anxiety/depression  #Alcohol use disorder  - Per psych team's recommendation and plan of care    #GERD  - Patient reported chronic history of GERD  - He has been on omeprazole "for many years"  - Could be related with chronic alcohol use  - We will start following while he is in the hospital but he is not seeking medication or help at this time    #Nephrolith (renal calculus)  - Patient CT reviewed 1.6 mm right kidney stone  - Patient also complained of pain  - Start Motrin 800 mg 3 times a  day as needed for colicky pain  - Will reach out to urology/internal medicine for further assistance and recommendation  - Otherwise lab and diagnostic test unremarkable except above and UA has trace protein  - Will repeat UA in few days  - PE largely unremarkable    Past Medical History:     Past Medical History:   Diagnosis Date    Anxiety     Depression               Past Surgical History:   History reviewed. No pertinent surgical history.    Family History:   History reviewed. No pertinent family history.    Social History:     Social History     Socioeconomic History    Marital status: Significant Other     Spouse name: Not on file    Number of children: Not on file    Years of education: Not on file    Highest education level: Not on file   Occupational History    Not on file   Tobacco Use    Smoking status: Never    Smokeless tobacco: Never   Vaping Use    Vaping status: Never Used   Substance and Sexual Activity    Alcohol use: Yes  Comment: daily- "until I pass out" " Yesterday I had 3 pints of vodka" 10/28/2022    Drug use: Never    Sexual activity: Not on file   Other Topics Concern    Not on file   Social History Narrative    Not on file     Social Determinants of Health     Financial Resource Strain: High Risk (10/14/2022)    Received from [place] ([facility]    Overall Financial Resource Strain (CARDIA)     Difficulty of Paying Living Expenses: Very hard   Food Insecurity: No Food Insecurity (10/28/2022)    Hunger Vital Sign     Worried About Running Out of Food in the Last Year: Never true     Ran Out of Food in the Last Year: Never true   Recent Concern: Food Insecurity - Food Insecurity Present (10/15/2022)    Received from [place] [facility] ([facility])    Hunger Vital Sign     Worried About Running Out of Food in the Last Year: Often true     Ran Out of Food in the Last Year: Often true   Transportation Needs: Unmet Transportation Needs (10/28/2022)    PRAPARE  - Therapist, art (Medical): Yes     Lack of Transportation (Non-Medical): Yes   Physical Activity: Unknown (10/14/2022)    Received from [facility] ([facility])    Exercise Vital Sign     Days of Exercise per Week: 5 days     Minutes of Exercise per Session: Not on file   Stress: Stress Concern Present (10/15/2022)    Received from [place] [facility] ([facility])    [employer] of Occupational Health - Occupational Stress Questionnaire     Feeling of Stress : To some extent   Social Connections: Socially Isolated (10/15/2022)    Received from [place] [facility] ([facility])    Social Connection and Isolation Panel [NHANES]     Frequency of Communication with Friends and Family: Three times a week     Frequency of Social Gatherings with Friends and Family: Never     Attends Religious Services: Never     Database administrator or Organizations: No     Attends Banker Meetings: Never     Marital Status: Separated   Intimate Partner Violence: Not At Risk (10/28/2022)    Humiliation, Afraid, Rape, and Kick questionnaire     Fear of Current or Ex-Partner: No     Emotionally Abused: No     Physically Abused: No     Sexually Abused: No   Housing Stability: Low Risk  (10/28/2022)    Housing Stability Vital Sign     Unable to Pay for Housing in the Last Year: No     Number of Places Lived in the Last Year: 0     Unstable Housing in the Last Year: No   Recent Concern: Housing Stability - High Risk (10/15/2022)    Received from [place] [facility] ([facility])    Housing Stability Vital Sign     In the last 12 months, was there a time when you were not able to pay the mortgage or rent on time?: Yes     In the past 12 months, how many times have you moved where you were living?: 1     At any time in the past 12  months, were you homeless or living in a shelter (including now)?: Yes       Allergies:     Allergies   Allergen Reactions     Abilify [Aripiprazole]     Effexor [Venlafaxine]     Haldol [Haloperidol]     Lithium     Paxil [Paroxetine]     Prozac [Fluoxetine]     Vivitrol [Naltrexone]        Medications:     Medications Prior to Admission   Medication Sig    ibuprofen (ADVIL) 800 MG tablet Take 1 tablet (800 mg) by mouth 3 (three) times daily as needed    omeprazole (PriLOSEC) 40 MG capsule Take 1 capsule (40 mg) by mouth daily       Review of Systems:     A comprehensive review of systems was:   Constitutional: Patient denies fever, chills, headaches, fatigue.   Neuro: Denies headaches, dizziness, visual disturbances, auditory disturbances, coordination difficulties, muscle weakness, paresthesias.  Psychiatric: Currently denies SI/HI/AVH.   Cardiovascular: Denies chest pain, extremity swelling, palpitations.  Pulmonary: Denies SOB, wheezing, coughing.  ENT: No ear pain, sinus pain/congestion, or sore throat.   Gastrointestinal: Denies N/V/D, constipation, or abdominal pain.  Genitourinary: Patient reported collicular pain, pain during urination  Integumentary: Denies any rash or lesions. No wounds.   Musculoskeletal: Denies any muscle pain. Denies any injury.      Physical Exam:     Vitals:    10/29/22 0743   BP: 110/73   Pulse: 71   Resp: 17   Temp: 97.5 F (36.4 C)   SpO2: 98%       Intake and Output Summary (Last 24 hours) at Date Time  No intake or output data in the 24 hours ending 10/29/22 0930    General: Patient appeared somewhat disheveled and but with normal BMI  Head: No wounds. Normocephalic.   Eyes: PERRLA. Eyes are anicteric.   ENT: Mucous membranes are moist and intact.   Neck: Supple. No lymphadenopathy. No goiter palpated. Trachea midline.   Neuro: CNll-CNXll examined & WNL. Patellar DTR 2+ bilaterally. Pt demonstrates normal strength. Pt ambulating well. No visible tremor.   Cardiac: Heart regular. Rate WNL. No murmur, rubs, or gallops. Peripheral pulses are full & intact. Cap refill < 3 sec. No LE swelling or  redness.   Pulmonary: LCTAB. No signs of SOB or dyspnea.   GI: Normoactive bowel sounds. Abd is soft & non-tender to touch. No distention.   GU: CT scan revealed kidney stone   SKIN: No rashes, lesions, or wounds noted.   MS: FROM both passively & actively. Pt has 5+ strength. Ambulating well.  Psych: Denies SI/HI/AVH.     Labs:     Results       Procedure Component Value Units Date/Time    Lipid Panel [[id number]] Collected: 10/29/22 0735    Specimen: Blood, Venous Updated: 10/29/22 0759    Hemoglobin A1C [[id number]] Collected: 10/29/22 0735    Specimen: Blood, Venous Updated: 10/29/22 0759    COVID-19 (SARS-CoV-2) only ([facility]) - Behavioral health admission (no isolation) [[id number]]  (Normal) Collected: 10/28/22 2241    Specimen: Swab from Anterior Nares Updated: 10/28/22 2322     SARS-CoV-2 (COVID-19) RNA Not Detected    Urinalysis with Reflex to Microscopic Exam and Culture [[id number]]  (Abnormal) Collected: 10/28/22 1917    Specimen: Urine, Clean Catch Updated: 10/28/22 2101    Narrative:      The following orders were created for panel  order Urinalysis with Reflex to Microscopic Exam and Culture.  Procedure                               Abnormality         Status                     ---------                               -----------         ------                     Urinalysis with Reflex t.Marland KitchenMarland Kitchen[[id number]]  Abnormal            Final result               Urine [employer] .Marland KitchenMarland Kitchen[[id number]]                      Final result                 Please view results for these tests on the individual orders.    Urine [employer] Tube [[id number]] Collected: 10/28/22 1917    Specimen: Urine, Clean Catch Updated: 10/28/22 2101     Extra Tube Hold for add-ons.    Urinalysis with Reflex to Microscopic Exam and Culture [[id number]]  (Abnormal) Collected: 10/28/22 1917    Specimen: Urine, Clean Catch Updated: 10/28/22 2046     Urine Color Yellow     Urine Clarity Clear     Urine Specific Gravity 1.024     Urine pH  5.5     Urine Leukocyte Esterase Negative     Urine Nitrite Negative     Urine Protein 10= Trace     Urine Glucose Negative     Urine Ketones 10= 1+ mg/dL      Urine Urobilinogen Normal mg/dL      Urine Bilirubin Negative     Urine Blood Negative     RBC, UA 0-2 /hpf      Urine WBC 0-5 /hpf      Urine Squamous Epithelial Cells 0-5 /hpf      Urine Mucus Present    TSH [[id number]]  (Normal) Collected: 10/28/22 1915    Specimen: Blood, Venous Updated: 10/28/22 2031     TSH 1.02 uIU/mL     Urine Drugs of Abuse Screen [[id number]]  (Abnormal) Collected: 10/28/22 1917    Specimen: Urine, Clean Catch Updated: 10/28/22 2004     Urine Amphetamine Screen Negative     Urine Barbituate Screen Negative     Urine Benzodiazepine Screen Positive     Urine Cannabinoid Screen Negative     Urine Cocaine Screen Negative     Urine Fentanyl Screen Negative     Urine Opiate Screen Negative     Urine PCP Screen Negative    Ethanol (Alcohol) Level [[id number]]  (Abnormal) Collected: 10/28/22 1915    Specimen: Blood, Venous Updated: 10/28/22 2003     Alcohol 135 mg/dL     Acetaminophen Level [[id number]]  (Abnormal) Collected: 10/28/22 1915    Specimen: Blood, Venous Updated: 10/28/22 2003     Acetaminophen Level <7 ug/mL     Magnesium [[id number]]  (Normal) Collected: 10/28/22 1915    Specimen: Blood, Venous Updated: 10/28/22 2003  Magnesium 2.2 mg/dL     Phosphorus [[id number]]  (Normal) Collected: 10/28/22 1915    Specimen: Blood, Venous Updated: 10/28/22 2003     Phosphorus 2.7 mg/dL     Comprehensive Metabolic Panel [[id number]]  (Normal) Collected: 10/28/22 1915    Specimen: Blood, Venous Updated: 10/28/22 2003     Glucose 91 mg/dL      BUN 16 mg/dL      Creatinine 0.7 mg/dL      Sodium 176 mEq/L      Potassium 4.1 mEq/L      Chloride 109 mEq/L      CO2 22 mEq/L      Calcium 8.5 mg/dL      Anion Gap 16.0     GFR >60.0 mL/min/1.73 m2      AST (SGOT) 31 U/L      ALT 23 U/L      Alkaline Phosphatase 77 U/L      Albumin 3.9 g/dL      Protein,  Total 7.1 g/dL      Globulin 3.2 g/dL      Albumin/Globulin Ratio 1.2     Bilirubin, Total 0.3 mg/dL     Salicylate Level [[id number]]  (Abnormal) Collected: 10/28/22 1915    Specimen: Blood, Venous Updated: 10/28/22 2003     Salicylate <5.0 mg/dL     CBC with Differential [[id number]]  (Abnormal) Collected: 10/28/22 1915    Specimen: Blood, Venous Updated: 10/28/22 1959    Narrative:      The following orders were created for panel order CBC with Differential.  Procedure                               Abnormality         Status                     ---------                               -----------         ------                     CBC with Differential[[id number]]        Abnormal            Final result                 Please view results for these tests on the individual orders.    CBC with Differential [[id number]]  (Abnormal) Collected: 10/28/22 1915    Specimen: Blood, Venous Updated: 10/28/22 1959     WBC 5.92 x10 3/uL      Hemoglobin 13.3 g/dL      Hematocrit 99.3 %      Platelet Count 267 x10 3/uL      MPV 9.0 fL      RBC 4.25 x10 6/uL      MCV 93.4 fL      MCH 31.3 pg      MCHC 33.5 g/dL      RDW 16 %      nRBC % 0.0 /100 WBC      Absolute nRBC 0.00 x10 3/uL      Preliminary Absolute Neutrophil Count 3.24 x10 3/uL      Neutrophils % 54.8 %      Lymphocytes % 32.6 %  Monocytes % 11.3 %      Eosinophils % 0.5 %      Basophils % 0.5 %      Immature Granulocytes % 0.3 %      Absolute Neutrophils 3.24 x10 3/uL      Absolute Lymphocytes 1.93 x10 3/uL      Absolute Monocytes 0.67 x10 3/uL      Absolute Eosinophils 0.03 x10 3/uL      Absolute Basophils 0.03 x10 3/uL      Absolute Immature Granulocytes 0.02 x10 3/uL             Rads:   Radiological Procedure reviewed.    I personally spent 45 minutes today, exclusive of procedures, providing care for this patient, including preparation, face to face time, EMR documentation and other services such as review of medical record, diagnostic results, patient education,  counseling, and coordination of care as specified in the encounter.      Signed by:   Mamie Levers, DNP FNP  Pronouns He/Him/His  Department of Psychiatry  [facility]    [address]   [place], [place] [address]   [email or web address] [email or web address] [phone]  [email or web address]            [facility]        *This note was generated by the Epic EMR system/ Dragon speech recognition and may contain inherent errors or omissions not intended by the user. Grammatical errors, random word insertions, deletions and pronoun errors  are occasional consequences of this technology due to software limitations. Not all errors are caught or corrected. If there are questions or concerns about the content of this note or information contained within the body of this dictation they should be addressed directly with the author for clarification.

## 2022-10-29 NOTE — H&P (Signed)
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 Reality testing ability    Plan / Recommendations:  Patient admitted to inpatient psychiatric unit on voluntary status for further diagnostic and safety evaluations, clinical stabilization with psychotropic treatment and non-pharmacological interventions, and for discharge planning.    Biological Plan:  Medications:   Start mirtazapine 15 mg qhs  Start acamprosate 666 mg TID  Scheduled Meds:  Current Facility-Administered Medications   Medication Dose Route Frequency    [START ON 10/30/2022] acamprosate  666 mg Oral TID    folic acid  1 mg Oral Daily    mirtazapine  15 mg Oral QHS    thiamine  100 mg Oral Daily    vitamins/minerals  1 tablet Oral Daily     Continuous Infusions:  PRN Meds:.acetaminophen, alum & mag hydroxide-simethicone, diphenhydrAMINE **OR** diphenhydrAMINE, hydrOXYzine, LORazepam **FOLLOWED BY** [START ON 10/30/2022] LORazepam **FOLLOWED BY** [START ON 10/31/2022] LORazepam **FOLLOWED BY** [START ON 11/01/2022] LORazepam **FOLLOWED BY** [START ON 11/02/2022] LORazepam, OLANZapine, QUEtiapine    Medical Work-up:  per unit FNP  Consults:  per unit FNP    Psychosocial Plan:  Individual therapy: Psycho-education and psychotherapy of the following modalities will be provided during daily visits:Supportive, Cognitive Behavioral Therapy, and Insight Oriented Psychotherapy  Group and milieu therapies: daily per unit's schedule.  Social Work intervention  will be provided for discharge planning and will include assistance with follow-up psychiatric care.    I personally saw and examined the patient and spent 75 minutes on this visit reviewing previous notes, counseling the patient and/or family members regarding the patient's condition(s), ordering of tests, managing medications, and documenting the findings in the note.   Patient was also seen by Dr. Merlinda Frederick. The assessment and plan were discussed and formulated together and has been reviewed and updated as needed.    Signed by: Eldridge Dace, NP  10/29/2022  2:45 PM

## 2022-10-29 NOTE — UM Notes (Signed)
[facility]  [address]  [place], [place] [address]  NPI: [id number]  Tax ID: [id number]      Gary [name].   DOB: 06/16/1980  [place] [employer]  ID: [id number]       Attending:  Dr. Graceann Congress, MD / NPI: [id number]    Admit to inpatient status on 10/28/22 @ 2257     Admit to Psych Unit on 10/29/22 @ 0042    Legal Status:  Voluntary    7/11 Per TDO Prescreen:  Client was brought to [facility] by [facility]OST for team evaluation.  Client called [phone] after having suicidal thoughts and plan to jump in front of a train.  Client had been drinking, was intoxicated, and endorsed SI.  Client presented as intoxicated and told Clinical research associate he drank a pint of vodka earlier today.  Despite intoxication, client was cooperative and engaged with Clinical research associate throughout assessment.  Client shared that he had been struggling with thoughts of suicide since this morning.  Client presented to [facility] this morning for voluntary admission but had a bad experience with a nurse and left.  Client then decided to attempt suicide by jumping in front of a train.  Client went to [facility] and waiting for a train.  When a train did not show immediately, client decided to call [phone].  Client presented as tearful and stated "I have no reason to live."  Client and his children are estranged.  Client has problematic relationship with family members and has no immediate family in the area.  Client is homeless.  Client endorsed current thought of SI during assessment and asked to go to the hospital.  Client told writer he is suffering from kidney stones and his doctor contacted him yesterday afternoon reporting that the kidney stones needed to be removed asap.  Client as anxiety about the surgery and presently worries about going into withdrawals from alcohol use.    Client attempted suicide in 2019 with pills and alcohol but was deferred by police officer who "talked him out of it."  Client attempted suicide in 2016 by drinking  a glass of bleach.  Client attempted suicide in 2004 by taking 52 pills of his psych meds.    Client has attempted to complete multiple drug rehabilitation programs in the past.  Client has had three successful completions.  Client suffers with crippling social anxiety that makes it difficult to share about personal experiences.    7/11 VS:  T 98.2, HR 117, RR 16, bp 123/77, Pox 95%    7/11 UDS: +Benzodiazepine, BAL: 135    7/11 Psychiatric Diagnoses per DNP Hyacinth Meeker.  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     7/11 Mental Status Exam per TDO Prescreen:  Appearance: WNL  Motor: psychomotor retardation  Behavior: tearful  Orientation: WNL  Speech: slurred  Mood: depressed  Affect: labile  Thought Content: impaired  Thought Process: impaired concentration  Sensory: WNL  Memory: impaired immediate  Appetite: WNL  Sleep: WNL  Insight: some  Judgment: impaired    7/11 Scheduled and PRN Meds:  No scheduled psych meds at time of review  Ativan 2mg  po q2hr prn  Zyprexa 10mg  po bid prn  Seroquel 50mg  po qHS prn     Treatment plan:  Activity as tolerated  Suicide precautions  Close monitoring q checks        VS bid   Medication management and stabilization  Group/individual therapy  Therapeutic Recreation eval and treat  D/c planning                   Rowan Blase, BSN, RN, [employer], ACM-RN          Utilization Review   [facility]  [address]  [address]  [place], [place] [address]  Phone: [phone] (VM)  [place]: [phone]  Fax: [phone]

## 2022-10-29 NOTE — Plan of Care (Addendum)
Problem: IP Suicidal Ideation  Description: Gary [name] endorses suicidal ideation with plan to jump off bridge  Goal: LTG: Gary [name] will exhibit compliance with therapy  Outcome: Progressing  Gary [name] is compliant with therapy  Goal: STG: Gary [name] will not engage in self-injurious activities  Outcome: Progressing  Gary [name] has not engaged in self harm behavior  Goal: STG: Gary [name] will identify 3 internal coping strategies  Outcome: Not Progressing           Gary [name] was A+Ox4, presents with blunted affect and pressured speech. Endorses passive SI with no intent, denies HI/AVH. BVC is 0. Pt encouraged to come to staff if thoughts or urges to harm self or others increases. Pt appears anxious, cooperative with Clinical research associate, able to answer questions and maintain eye contact. Pt describes his mood as "just the same".  Reports difficulty sleeping and some improved appetite.  Gary [name] rated his depression as 10/10 and anxiety as 8/10. Gary [name] was adherent to scheduled medication, requested Seroquel for sleep, complained of 6/10 abdominal pain, prn Ibuprofen given. Gary [name] was isolative to self, interacted with staff appropriately. Pt was assessed for alcohol withdrawal symptoms every 4 hours, not scoring. Q15 min safety checks maintained. Will continue to monitor and ensure safety.     Pt observed in bed, eyes closed, no apparent distress noted, slept for 8 hours.

## 2022-10-29 NOTE — Progress Notes (Signed)
Overnight BH Coverage    Notified of patient arriving on unit.   Admission orders placed

## 2022-10-29 NOTE — Progress Notes (Signed)
Therapist and patient met to complete safety safety plan. Pt continues to endorse SI as well as social anxiety.  He contracted for safety and identified the following coping skills:  Taking a shower, coming out of his room/isolating less, reading, listening to music, looking out the window, and positive affirmations.    Pt reports social anxiety though is willing to attend groups that involve less interaction (music, art, leisure).    Will continue to monitor, assess, and encourage group attendance.

## 2022-10-29 NOTE — Discharge Instr - AVS First Page (Addendum)
[facility] Continuing Care Plan, including the After Visit Summary (AVS) and the Physician's Discharge Summary were faxed on 11/18/2022  to [facility]  At 3:00 PM. Fax # [phone]. And [facility], FAX# [phone]    RECOMMENDED THAT YOU COMPLY WITH THE FOLLOWING PLAN:   Follow up appointment with: [facility] with Dr. Mattie Marlin NP [place] Agyemang                   Date: December 01, 2022      Time: 8:15 AM  Location:  [address] [place], [place] [address]  P: [phone]  F: [phone]     (Please arrive 30 minutes early for your appointment)      (RECOMMENDED THAT YOU COMPLY WITH THE FOLLOWING PLAN:     Contact [facility]-   [address]  [address]  [place], [place] [address]  [phone]       Other resources available for you:    [facility]  Location: [address], [place] [address]   P: [phone]  Services provided include:    Case management  Support with locating housing, etc.     [employer] number  [phone])    And [place] [facility]eless shelter Hotline # 248-678-2412    Another option is to call [place] 2-1-1. You can call toll free from any phone.  Call specialists are trained to assist people faced with housing needs, utility shutoffs, family crisis, financial, legal, employment and other issues.  The database has information about nearly 5,000 agencies providing services statewide.    [facility]  [address] [place], [place], [place] [address]   [phone]  (Please contact to follow-up on your referral)     Psych[facility] Today  (If interested in a therapist, please contact someone from the list provided to you, or search www.psychologytoday.com to get connected with a therapist.)    FOR EMERGENCY MENTAL HEALTH or Substance Abuse Appointment, CONTACT [facility] at [phone] or your local community services board emergency mental health/substance  abuse at: [facility]; [phone]).     The Crisis Center provides free crisis services 24 hours a day/ 365 days a year. Services are provided by telephone [phone]) or in person at [address] in [place] (no appointment needed). Mobile Crisis Team (MCT) provides emergency crisis evaluations for individuals who are experiencing a mental health crisis.   Full crisis assessments and treatment referrals are provided for all crises, both psychiatric and situational.    [facility]  [phone] [place] [address]  Within [place]:  311  24-Hour Crisis Center: [phone]  Hours of Operation: Monday - Friday     8:30 am - 5:00 pm [facility] 24/7)      988 Suicide and Crisis Lifeline    TOBACCO CESSATION DISCHARGE REFERRAL     please Contact QUIT NOW on Tuesday between 1-3pm at [phone]).        [facility] [employer] [facility]  [facility] [place]  Address: [facility] [place]  [address]  [place], [place] [address]  Phone: [phone]  Email Address: [email or web address]@[email or web address]  Website: http://www.[facility].org  Serving: [place] of [place], [place], [place]; Cities of [place], [place] and [address]       Depression and Bipolar Support Alliance  [facility] [place] provides free weekly mental health support groups.    We currently offer  groups M-W from 7-9PM.  If you'd like to join a group, email [place] indicating what day works best and we will add you to the invite list.    All groups are led by Peer Support Specialists facilitators who have lived-experience with mental health challenges.    All meetings are virtual via Zoom so people can participate from any part of [place].    For assistance on joining a support group, please email:  [email or web address]    Our groups:   Monday Evening 7:00PM to 9:00PM  General Support/Wellness Group  Facilitators:  [facility]    Tuesday Evening 7:00PM to 9:00PM  General  Support/Wellness Group  Facilitators:  [facility]    Wednesday Evening 7:00PM to 9:00 PM  General Support/Wellness Group  Facilitators:  Remi Esordi

## 2022-10-29 NOTE — Plan of Care (Signed)
Problem: IP Suicidal Ideation  Description: Piero endorses suicidal ideation with plan to jump off bridge  Goal: LTG: Durward will exhibit compliance with therapy  Outcome: Not Progressing   Kendrick has not attended groups today but has been trying to catch up on sleep.   Problem: Addiction to alcohol or opioids or other substances AS EVIDENCED BY:  Description: Harroll endorses daily alcohol use  Goal: Patient achieves a safe detoxification and management of withdrawal symptoms  Description: Interventions:  1. Assess withdrawal signs/symptoms according to identified protocol (e.g. CIWA/COWS)  2. Provide medication teaching including name, dosage, benefits, action, effect and side effects  3. Assess effectiveness (relief of withdrawal symptoms) and side effects of medication  4. Educate about health risks associated with withdrawal (e.g. seizures, DTs)  5. Ensure laboratory results are reviewed with the LIP to increase understanding of the medical consequences of addiction  6. Ensure adequate hydration and nutrition during withdrawal  7. Educate about the importance of reporting dizziness or unsteadiness while ambulating to care providers (e.g. RN, LIP)  Outcome: Progressing  Rendon and nurse discussed symptoms of withdrawal.     Irbin was A+Ox4 with calm affect and clear speech. He denied active SI/HI/AVH/SIB urges. BVC is 0. Pt states they are committed to maintaining their safety, encouraged to come to staff if thoughts or urges to harm self or others increases, Bryce verbalizes understanding. Pt is calm and cooperative with Clinical research associate, able to answer questions and maintain good eye contact. Pt describes their mood as "ok". He denied issues with his sleep/energy/appetite.  Cohan rated their depression as 10 and anxiety as 7 (with 10 being the highest possible value). Elfego states their goal for today is to "catch up on sleep". Arville has been compliant with medication administration and mouth check completed. Witten was visible in the  milieu at times during the shift and interacted with peers and staff occasionally. Q15 min safety checks maintained. Will continue to monitor and ensure safety.  PRNs: Vistaril this afternoon      "I, Shireen Quan, RN, hereby attest that I have been made aware of and reviewed the following for this patient:    - Biopsychosocial assessment;  - Diagnoses;  - Nursing needs and medical support, if applicable; and  - Treatment Plan Goals, Objectives and Strategies/Interventions.    I am involved in the care of this patient within my scope of practice and assigned tasks. I approve the treatment plan and this attestation is in lieu of a signature on the document."
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
## 2022-10-29 NOTE — Consults (Signed)
Nutritional Support Services  Nutrition Assessment    Gary [name]. 42 y.o. male   MRN: [id number]    NUTRITION RECOMMENDATIONS:    Continue Regular diet as ordered.  Will add Ensure HP BID with meals.   Please record intakes in Flowsheets.    -----------------------------------------------------------------------------------------------------------------                                                      Assessment Data:     Referral Source: Consult   Reason for Referral: Wt loss, decreased appetite    Assessment:   Patient is 42 y/o male presenting under CSB Vol for Suicidal ideation with intent and plan to jump off bridge in context of multiple stressors in life recently. Also noted to be intoxicated during evaluation. Noted to have hx of anxiety, depression and alcohol use disorder. No recent weight history per chart review. Pt reports decreased appetite.    Diet/Social Hx:   Orders Placed This Encounter   Procedures    Adult diet Regular       ANTHROPOMETRIC  Height: 170.2 cm (5\' 7" )  Weight: 68 kg (150 lb)    Allergies:   Allergies   Allergen Reactions    Abilify [Aripiprazole]     Effexor [Venlafaxine]     Haldol [Haloperidol]     Lithium     Paxil [Paroxetine]     Prozac [Fluoxetine]     Vivitrol [Naltrexone]        Pertinent labs:  Recent Labs   Lab 10/28/22  1915   Sodium 143   Potassium 4.1   Chloride 109   CO2 22   BUN 16   Creatinine 0.7   Calcium 8.5   Albumin 3.9   Protein, Total 7.1   Bilirubin, Total 0.3   Alkaline Phosphatase 77   ALT 23   AST (SGOT) 31   Glucose 91     Lab Results   Component Value Date    HGBA1C 4.9 10/29/2022                                                                  Monitoring     Monitor weight, labs, GI status and POC.      Sheli Burdett Pinzon,RDN  Clinical Dietitian   RD Office [phone] or available on Secure Chat
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
## 2022-10-29 NOTE — ED Notes (Addendum)
Pt transport at bedside, security escort on their way. Pt calm and cooperative.

## 2022-10-30 DIAGNOSIS — F1994 Other psychoactive substance use, unspecified with psychoactive substance-induced mood disorder: Secondary | ICD-10-CM

## 2022-10-30 MED ORDER — MIRTAZAPINE 15 MG PO TABS
15.0000 mg | ORAL_TABLET | Freq: Once | ORAL | Status: AC
Start: 2022-10-30 — End: 2022-10-30
  Administered 2022-10-30: 15 mg via ORAL
  Filled 2022-10-30: qty 1

## 2022-10-30 MED ORDER — MIRTAZAPINE 15 MG PO TABS
30.0000 mg | ORAL_TABLET | Freq: Every evening | ORAL | Status: DC
Start: 2022-10-31 — End: 2022-11-02
  Administered 2022-10-31 – 2022-11-01 (×2): 30 mg via ORAL
  Filled 2022-10-30 (×2): qty 2

## 2022-10-30 NOTE — Plan of Care (Signed)
Problem: IP Suicidal Ideation  Description: Gary [name] endorses suicidal ideation with plan to jump off bridge  Goal: LTG: Gary [name] will exhibit compliance with therapy  Outcome: Not Progressing, Gary [name] has not been attending or participating in group therapies. Gary [name] has mostly been isolative to his room.     Problem: Addiction to alcohol or opioids or other substances AS EVIDENCED BY:  Description: Gary [name] endorses daily alcohol use  Goal: Patient achieves a safe detoxification and management of withdrawal symptoms  Description: Interventions:  1. Assess withdrawal signs/symptoms according to identified protocol (e.g. CIWA/COWS)  2. Provide medication teaching including name, dosage, benefits, action, effect and side effects  3. Educate about health risks associated with withdrawal   4. Ensure adequate hydration and nutrition during withdrawal  5. Educate about the importance of reporting dizziness or unsteadiness while ambulating to care providers   Outcome: Progressing, Gary [name] continues on CIWA, scoring 1 only for mild anxiety, and denies any withdrawal symptoms. Pt acknowledges understanding to report new or worsening symptoms of withdrawal to nurse.    "I, Antania Hoefling RN, hereby attest that I have been made aware of and reviewed the following for this patient:    - Biopsychosocial assessment;  - Diagnoses;  - Nursing needs and medical support, if applicable; and  - Treatment Plan Goals, Objectives and Strategies/Interventions.    I am involved in the care of this patient within my scope of practice and assigned tasks. I approve the treatment plan and this attestation is in lieu of a signature on the document."    Gary [name] is alert and oriented x 4. Constricted affect. Thought process and content is WNL. Speech is clear, linear, and goal directed. Cognition is intact. Pt is cooperative with nursing assessment. Pt displays appropriate concentration during assessment. Pt was compliant with medication administration. Pt denies  questions or concerns about medication. Pt denies HI and AVH. Pt reports having "suicidal thoughts", that he describes as passive, with no plan or intent. Pt reports that he would rate his level of depression as 10 out of 10, with 10 being the worst. Pt reports that he would rate his level of anxiety as 8 out of 10, with 10 being the worst. Pt agrees to inform staff if urges to harm self or others arise. Pt describes his mood as "depressed". Pt is mostly isolative to room, with little to no social interactions. Pt states that his appetite is WNL. Pt states that his energy level is WNL. Pt states that he is feeling safe on the unit. Pt is not attending group therapies. Pt was encouraged to identify and utilize positive coping skills in times of distress. Pt continues on Q 15 minute safety checks as ordered. Pt denies pain. Monitor for mood, behaviors, pain, and safety.

## 2022-10-30 NOTE — Progress Notes (Signed)
Psychiatry Progress Note    Patient Name: Gary [name].            Current Date/Time:  7/13/202410:00 AM  MRN:  [id number]                            Admission Date/Time: 10/28/2022  8:33 PM  DOB: May 05, 1980                              Admitting Physician: Farris Has, MD     Gender: male                          Attending Physician: Farris Has, MD    I. History   Informants: Patient, medical record, treatment team  A.Chief Complaint or Reason for Admission  Admitted for worsening depression and suicidal ideation.    B.History of Present Illness     (Symptoms and qualifiers:1-3 for brief, at least 4 for extended)    Patient is a 42 y.o. divorced, un-domiciled male with history of depression, anxiety, and alcohol use disorder admitted for worsening depression and suicidal ideation with a plan to jump off a bridge. Patient was evaluated by the [facility] and is currently admitted voluntarily.       10/30/22 Evaluation    Rounded around 10am: found sound asleep.    Rounded closer to lunch hour; pt was awake, alert sitting up on bed.    Sleep:  Fell asleep around 10-11pm; stayed asleep except for vital signs to monitor withdrawal sx.   -Able to fall asleep post BP measurement.  -Denied grogginess this morning.    Appetite:   -ate some of his breakfast today; did not see a change in appetite so far.    Depression "still the same as the day I came"  Anxiety: 8/10 "10 being the worst" vistaril helped to bring level to 7/10.     Withdrawal symptoms   - yesterday:slight seating  - today: none so far    Meds for craving:  -Naltrexone: "it made cravings worse" taken in 2019  - vivitrol: taken in 2019, given at ED, and throa closing up  - pt is aware acamprosate was initiated this AM by team, and he had already taken it.    Goal today:  - encouraged to go to groups, and stay hydrated.  - pt is not planning to make calls, or not expecting visitors     C.3.Medical History  Review of Systems  (Extended 2-9, Complete  10 or more)  A complete 14 point ROS was done and was negative except for  Psychiatric: Depression and Anxiety    II. Examination   Vital signs reviewed:   Blood pressure 103/68, pulse 64, temperature 97.3 F (36.3 C), temperature source Oral, resp. rate 16, height 1.702 m (5\' 7" ), weight 68 kg (150 lb), SpO2 98%.       Mental Status Exam  General appearance: Appears chronological age, Good hygiene, and Good grooming  Attitude/Behavior: Calm, Cooperative, and Eye Contact is  Good  Motor: No abnormalities noted  Gait: No obvious abnormalities  Muscle strength and tone: Grossly intact  Speech:   Spontaneous: Yes  Rate and Rhythm: Normal  Volume: Normal  Tone: Normal  Clarity: Yes  Mood: anxious  Affect:   Range: Constricted  Stability: Stable  Appropriateness to thought content: Yes  Intensity: Normal          Thought Process:   Coherent: Yes  Logical:  Yes  Associations: Goal-directed  Thought Content:   Delusions:  Not elicited  Depressive Cognitions:  Hopeless, Helpless, and Guilt  Suicidal:  No suicidal thoughts  Homicidal:  No homicidal thoughts  Violent Thoughts:  No  Perceptions:   Dissociative Phenomena:  No  Hallucinations: No  Insight: Fair  Judgment: Poor  Cognition:   Level of Consciousness: Intact  Orientation: Intact to self, place and time    Psychiatric / Cognitive Instruments: None    Physical Exam: See Admission Physical Exam by Nurse Practitioner Gilford Silvius dated 10/29/2022    Imaging / EKG / Labs: Labs in the last 24 hours  Results       ** No results found for the last 24 hours. **          Labs in the last 72 hours   Results       Procedure Component Value Units Date/Time    Hemoglobin A1C [[id number]] Collected: 10/29/22 0735    Specimen: Blood, Venous Updated: 10/29/22 1006     Hemoglobin A1C 4.9 %      Average Estimated Glucose 93.9 mg/dL     Lipid Panel [[id number]] Collected: 10/29/22 0735    Specimen: Blood, Venous Updated: 10/29/22 1005     Cholesterol 165 mg/dL      Triglycerides 79  mg/dL      HDL 60 mg/dL      LDL Calculated 89 mg/dL      VLDL Calculated 16 mg/dL      Cholesterol / HDL Ratio 2.8 Index     COVID-19 (SARS-CoV-2) only ([facility]) - Behavioral health admission (no isolation) [[id number]]  (Normal) Collected: 10/28/22 2241    Specimen: Swab from Anterior Nares Updated: 10/28/22 2322     SARS-CoV-2 (COVID-19) RNA Not Detected    Urinalysis with Reflex to Microscopic Exam and Culture [[id number]]  (Abnormal) Collected: 10/28/22 1917    Specimen: Urine, Clean Catch Updated: 10/28/22 2101    Narrative:      The following orders were created for panel order Urinalysis with Reflex to Microscopic Exam and Culture.  Procedure                               Abnormality         Status                     ---------                               -----------         ------                     Urinalysis with Reflex t.Marland KitchenMarland Kitchen[[id number]]  Abnormal            Final result               Urine [employer] .Marland KitchenMarland Kitchen[[id number]]                      Final result                 Please view results for these tests on the individual orders.    Urine [employer] Tube [[id number]] Collected: 10/28/22 1917  Specimen: Urine, Clean Catch Updated: 10/28/22 2101     Extra Tube Hold for add-ons.    Urinalysis with Reflex to Microscopic Exam and Culture [[id number]]  (Abnormal) Collected: 10/28/22 1917    Specimen: Urine, Clean Catch Updated: 10/28/22 2046     Urine Color Yellow     Urine Clarity Clear     Urine Specific Gravity 1.024     Urine pH 5.5     Urine Leukocyte Esterase Negative     Urine Nitrite Negative     Urine Protein 10= Trace     Urine Glucose Negative     Urine Ketones 10= 1+ mg/dL      Urine Urobilinogen Normal mg/dL      Urine Bilirubin Negative     Urine Blood Negative     RBC, UA 0-2 /hpf      Urine WBC 0-5 /hpf      Urine Squamous Epithelial Cells 0-5 /hpf      Urine Mucus Present    TSH [[id number]]  (Normal) Collected: 10/28/22 1915    Specimen: Blood, Venous Updated: 10/28/22 2031     TSH  1.02 uIU/mL     Urine Drugs of Abuse Screen [[id number]]  (Abnormal) Collected: 10/28/22 1917    Specimen: Urine, Clean Catch Updated: 10/28/22 2004     Urine Amphetamine Screen Negative     Urine Barbituate Screen Negative     Urine Benzodiazepine Screen Positive     Urine Cannabinoid Screen Negative     Urine Cocaine Screen Negative     Urine Fentanyl Screen Negative     Urine Opiate Screen Negative     Urine PCP Screen Negative    Ethanol (Alcohol) Level [[id number]]  (Abnormal) Collected: 10/28/22 1915    Specimen: Blood, Venous Updated: 10/28/22 2003     Alcohol 135 mg/dL     Acetaminophen Level [[id number]]  (Abnormal) Collected: 10/28/22 1915    Specimen: Blood, Venous Updated: 10/28/22 2003     Acetaminophen Level <7 ug/mL     Magnesium [[id number]]  (Normal) Collected: 10/28/22 1915    Specimen: Blood, Venous Updated: 10/28/22 2003     Magnesium 2.2 mg/dL     Phosphorus [[id number]]  (Normal) Collected: 10/28/22 1915    Specimen: Blood, Venous Updated: 10/28/22 2003     Phosphorus 2.7 mg/dL     Comprehensive Metabolic Panel [[id number]]  (Normal) Collected: 10/28/22 1915    Specimen: Blood, Venous Updated: 10/28/22 2003     Glucose 91 mg/dL      BUN 16 mg/dL      Creatinine 0.7 mg/dL      Sodium 756 mEq/L      Potassium 4.1 mEq/L      Chloride 109 mEq/L      CO2 22 mEq/L      Calcium 8.5 mg/dL      Anion Gap 43.3     GFR >60.0 mL/min/1.73 m2      AST (SGOT) 31 U/L      ALT 23 U/L      Alkaline Phosphatase 77 U/L      Albumin 3.9 g/dL      Protein, Total 7.1 g/dL      Globulin 3.2 g/dL      Albumin/Globulin Ratio 1.2     Bilirubin, Total 0.3 mg/dL     Salicylate Level [[id number]]  (Abnormal) Collected: 10/28/22 1915    Specimen: Blood, Venous Updated: 10/28/22 2003     Salicylate <5.0 mg/dL     CBC with  Differential [[id number]]  (Abnormal) Collected: 10/28/22 1915    Specimen: Blood, Venous Updated: 10/28/22 1959    Narrative:      The following orders were created for panel order CBC with Differential.  Procedure                                Abnormality         Status                     ---------                               -----------         ------                     CBC with Differential[[id number]]        Abnormal            Final result                 Please view results for these tests on the individual orders.    CBC with Differential [[id number]]  (Abnormal) Collected: 10/28/22 1915    Specimen: Blood, Venous Updated: 10/28/22 1959     WBC 5.92 x10 3/uL      Hemoglobin 13.3 g/dL      Hematocrit 30.8 %      Platelet Count 267 x10 3/uL      MPV 9.0 fL      RBC 4.25 x10 6/uL      MCV 93.4 fL      MCH 31.3 pg      MCHC 33.5 g/dL      RDW 16 %      nRBC % 0.0 /100 WBC      Absolute nRBC 0.00 x10 3/uL      Preliminary Absolute Neutrophil Count 3.24 x10 3/uL      Neutrophils % 54.8 %      Lymphocytes % 32.6 %      Monocytes % 11.3 %      Eosinophils % 0.5 %      Basophils % 0.5 %      Immature Granulocytes % 0.3 %      Absolute Neutrophils 3.24 x10 3/uL      Absolute Lymphocytes 1.93 x10 3/uL      Absolute Monocytes 0.67 x10 3/uL      Absolute Eosinophils 0.03 x10 3/uL      Absolute Basophils 0.03 x10 3/uL      Absolute Immature Granulocytes 0.02 x10 3/uL           EKG Results  Cardiology Results       Procedure Component Value Units Date/Time    ECG 12 lead (Electrocardiogram) [[id number]] Collected: 10/28/22 1740     Updated: 10/28/22 1753     Ventricular Rate 103 BPM      Atrial Rate 103 BPM      P-R Interval 148 ms      QRS Duration 98 ms      Q-T Interval 334 ms      QTC Calculation (Bezet) 437 ms      P Axis 47 degrees      R Axis 49 degrees      T Axis 37 degrees      [facility] MUSE NARRATIVE AND IMPRESSION --  SINUS TACHYCARDIA  OTHERWISE NORMAL ECG  NO PREVIOUS ECGS AVAILABLE  Confirmed by Neomia Dear MD, Cayleb ([id number]) on 10/28/2022 5:53:08 PM      Narrative:      SINUS TACHYCARDIA  OTHERWISE NORMAL ECG  NO PREVIOUS ECGS AVAILABLE  Confirmed by Neomia Dear MD, Akshith ([id number]) on 10/28/2022 5:53:08 PM          Brain ScanNo results found  for any visits on 10/28/22.    III. Assessment and Plan (Medical Decision Making)     1. I certify that this patient requires inpatient hospitalization due to acute risk to self and unable to care for self in the community with insufficient support available    2. Psychiatric Diagnoses  Depression, unspecified  Rule out MDD vs substance-induced mood disorder  Social Anxiety Disorder  Alcohol Use Disorder  Rule out PTSD    3. Additional Diagnoses    4.Labs reviewed and compared to prior labs in the system. Past medical records reviewed. Coordination of care was discussed with inpatient team and as available with the outpatient team.    5. Assessment / Impression  Patient is a 42 y.o. divorced, un-domiciled male with history of depression, anxiety, and alcohol use disorder admitted for worsening depression and suicidal ideation with a plan to jump off a bridge. Patient was evaluated by the [facility] and is currently admitted voluntarily.     7/12:  -Patient describes a several-month history of worsening depression with suicidal ideation in the context of alcohol use/intoxication and multiple psychosocial stressors. He reports depressive symptoms including low mood, anhedonia, negative cognitions, poor energy, and poor concentration along with increased suicidality. He also endorses severe social anxiety that limits his ability to participate in groups. He has had multiple medication trials throughout his lifetime with variable effect, but he is not currently connected to outpatient mental health treatment and he is not currently prescribed any medications. He reports that he has not tolerated SSRI/SNRIs in the past secondary to nausea and adverse effects. He reports that he had benefited from mirtazapine in the past for depression but did not continue the medication. He is also interested in pharmacologic treatment for alcohol use disorder but he is wary of naltrexone because he did not tolerate it in the past.  -Discussed  the risks, benefits, and alternatives to a trial of mirtazapine and acamprosate and patient agreed to the trial. Will offer mirtazapine 15 mg qhs tonight and will start Acamprosate 666 mg TID tomorrow.    7/13:  Patient slept fairly well despite interruptions for CIWA monitoring; not scoring at all. Denied  withdrawal symptoms. Reported high depression and anxiety.  Pt appreciates remeron increasing dose to 30 mg starting  Sunday 7/14 due to  his hx of  taking remeron.    - encouraged pt to hydrate and go to groups today    Suicide Risk Assessment  Suicide Thoughts / Behaviors: None  Current Plan: None  Access to firearm: No  Past suicide attempts: Yes  Diagnosis of MDD, BPAD, Schizophrenia, Cluster B PD, Anorexia, Substance Use: Yes, MDD, alcohol use.  History of CNS disease, Cancer, HIV, Chronic Pain, ESRD, COPD, SLE: No  Psychosocial Stressors: Yes, unstable housing, financial stressors, relationship stressors.  Physical Trauma: Yes  Emotional Trauma: Yes  Sexual Trauma: Yes  Family history of suicide: No  Psychological Features: Hopeless, Anxious, and Impulsive  Currently Intoxicated: No  Demographic Risk Factors: Yes, divorced, homeless, male.  Protective Factors: Reality testing ability    Plan / Recommendations:  Biological Plan:  Medications:   -CIWA monitoring with lorazepam  mirtazapine :  7/12-13: 15 mg qhs   7/14: 30 mg daily    acamprosate 666 mg TID (first dose 7/13)  supplements  Current Facility-Administered Medications   Medication Dose Route Frequency    acamprosate  666 mg Oral TID    folic acid  1 mg Oral Daily    mirtazapine  15 mg Oral Once    [START ON 10/31/2022] mirtazapine  30 mg Oral QHS    thiamine  100 mg Oral Daily    vitamins/minerals  1 tablet Oral Daily     Continuous Infusions:  PRN Meds:.acetaminophen, alum & mag hydroxide-simethicone, diphenhydrAMINE **OR** diphenhydrAMINE, hydrOXYzine, ibuprofen, [EXPIRED] LORazepam **FOLLOWED BY** LORazepam **FOLLOWED BY** [START ON 10/31/2022]  LORazepam **FOLLOWED BY** [START ON 11/01/2022] LORazepam **FOLLOWED BY** [START ON 11/02/2022] LORazepam, OLANZapine, QUEtiapine    Medical Work-up:  per unit FNP  Consults:  per unit FNP    Psychosocial Plan:  Individual therapy: Psycho-education and psychotherapy of the following modalities will be provided during daily visits:Supportive, Cognitive Behavioral Therapy, and Insight Oriented Psychotherapy  Group and milieu therapies: daily per unit's schedule.  Social Work intervention will be provided for discharge planning and will include assistance with follow-up psychiatric care.    I personally saw and examined the patient and spent 24 minutes on this visit reviewing previous notes, counseling the patient and/or family members regarding the patient's condition(s), ordering of tests, managing medications, and documenting the findings in the note.     Signed by: Cecille Rubin, NP  10/30/2022  2:21 PM

## 2022-10-30 NOTE — Progress Notes (Signed)
Patient attended 0/4 groups today.    Group Attendance:    Goals: No  Psycho-educational: No  Group Therapy: No  Creative Therapy: No    Patient was encouraged to attend groups. Patient was observed resting in his room.

## 2022-10-31 DIAGNOSIS — F339 Major depressive disorder, recurrent, unspecified: Secondary | ICD-10-CM

## 2022-10-31 DIAGNOSIS — F1019 Alcohol abuse with unspecified alcohol-induced disorder: Secondary | ICD-10-CM

## 2022-10-31 LAB — LAB USE ONLY - URINALYSIS WITH MICROSCOPIC EXAM
Urine Bilirubin: NEGATIVE
Urine Blood: NEGATIVE
Urine Glucose: NEGATIVE
Urine Ketones: NEGATIVE mg/dL
Urine Leukocyte Esterase: NEGATIVE
Urine Nitrite: NEGATIVE
Urine Protein: NEGATIVE
Urine Specific Gravity: 1.018 (ref 1.001–1.035)
Urine Urobilinogen: NORMAL mg/dL (ref 0.2–2.0)
Urine pH: 6 (ref 5.0–8.0)

## 2022-10-31 LAB — LAB USE ONLY - URINE GRAY CULTURE HOLD TUBE

## 2022-10-31 NOTE — Plan of Care (Signed)
Gary [name] was A+Ox4 with stable affect and normal speech however appeared depressed and need heavy encouragement to come out of his room. He currently denies SI/HI/AVH/SIB urges. BVC is 0. Pt committed to maintaining his safety, encouraged to come to staff if thoughts or urges to harm self or others increases, Gary [name] verbalizes understanding. Pt is calm and cooperative with care, able to answer questions and maintain good eye contact. Pt describes his mood as "tired." He reports his sleep as normal but reports his energy/appetite has been decreased. He rated his depression as 8/10 and anxiety as 3/10 (with 10 being the highest possible value). Gary [name] states his goal for today is "nothing." Gary [name] was compliant with medication administration and mouth check completed. Gary [name] was isolative to self in his room, appeared resting in his bed mostly this shift. Encouraged him to participate in groups, and socialize with peers and staff appropriately. Urinalysis with Microscopic Exam and culture sample sent to lab, and result pending. Pt is on CIWA Q4 hrs, scored 3, 1, 2, and no sign and symptoms of withdrawn noted beside having mild anxiety. Q15 min safety checks maintained. Will continue to monitor and ensure safety.  PRNs? Why? None      "I, Gary [name], hereby attest that I have been made aware of and reviewed the following for this patient:    - Biopsychosocial assessment;  - Diagnoses;  - Nursing needs and medical support, if applicable; and  - Treatment Plan Goals, Objectives and Strategies/Interventions.    I am involved in the care of this patient within my scope of practice and assigned tasks. I approve the treatment plan and this attestation is in lieu of a signature on the document."  Problem: IP Suicidal Ideation  Description: Gary [name] endorses suicidal ideation with plan to jump off bridge  Goal: LTG: Gary [name] will exhibit compliance with therapy  Outcome: Not Progressing  Gary [name] is isolative, hasn't attended grops despite  enciurage  Goal: STG: Gary [name] will not engage in self-injurious activities  Outcome: Progressing  Gary [name] denies SI and SIB noted.     Problem: Addiction to alcohol or opioids or other substances AS EVIDENCED BY:  Description: Gary [name] endorses daily alcohol use  Goal: Patient achieves a safe detoxification and management of withdrawal symptoms  Description: Interventions:  1. Assess withdrawal signs/symptoms according to identified protocol (e.g. CIWA/COWS)  2. Provide medication teaching including name, dosage, benefits, action, effect and side effects  3. Assess effectiveness (relief of withdrawal symptoms) and side effects of medication  4. Educate about health risks associated with withdrawal (e.g. seizures, DTs)  5. Ensure laboratory results are reviewed with the LIP to increase understanding of the medical consequences of addiction  6. Ensure adequate hydration and nutrition during withdrawal  7. Educate about the importance of reporting dizziness or unsteadiness while ambulating to care providers (e.g. RN, LIP)  Outcome: Progressing  Pt reports of mild anxiety and no other withdrawal symptoms noted.

## 2022-10-31 NOTE — Plan of Care (Addendum)
Problem: Pain interferes with ability to perform ADL  Goal: Pain at adequate level as identified by patient  Description: Interventions:  1. Identify patient comfort function goal  2. Evaluate if patient comfort function goal is met  3. Assess pain on admission, during daily assessment and/or before any "as needed" intervention(s)  4. Reassess pain within 30-60 minutes of any procedure/intervention, per Pain Assessment, Intervention, Reassessment (AIR) Cycle  5. Evaluate patient's satisfaction with pain management progress  6. Offer non-pharmacological pain management interventions  7. Consult/collaborate with Pain Service  8. Consult/collaborate with Physical Therapy, Occupational Therapy, and/or Speech Therapy  9. Assess for risk of opioid induced respiratory depression and side effects, including snoring/sleep apnea. Alert healthcare team of risk factors identified.  10. Include patient/patient care companion in decisions related to pain management as needed  Outcome: Progressing  Gary [name] reports feeling pain to right side of abdomen 4/10.  Requests PRN Ibuprofen.       Problem: IP Suicidal Ideation  Description: Gary [name] endorses suicidal ideation with plan to jump off bridge  Goal: LTG: Gary [name] will exhibit compliance with therapy  Outcome: Progressing  Gary [name] compliant with medication administration, mouth check completed.    Goal: STG: Gary [name] will not engage in self-injurious activities  Outcome: Progressing  No SIB observed or reported.       Problem: Addiction to alcohol or opioids or other substances AS EVIDENCED BY:  Description: Gary [name] endorses daily alcohol use  Goal: Patient achieves a safe detoxification and management of withdrawal symptoms  Description: Interventions:  1. Assess withdrawal signs/symptoms according to identified protocol (e.g. CIWA/COWS)  2. Provide medication teaching including name, dosage, benefits, action, effect and side effects  3. Assess effectiveness (relief of withdrawal symptoms) and  side effects of medication  4. Educate about health risks associated with withdrawal (e.g. seizures, DTs)  5. Ensure laboratory results are reviewed with the LIP to increase understanding of the medical consequences of addiction  6. Ensure adequate hydration and nutrition during withdrawal  7. Educate about the importance of reporting dizziness or unsteadiness while ambulating to care providers (e.g. RN, LIP)  Outcome: Progressing  CIWA Q4 maintained for assessment of safe detoxification.        Gary [name] observed isolated to room sitting on bed.  Pt calm and cooperative upon approach.  Pt A&Ox4 with coherent thought process , clear speech and maintained good eye contact.  Pt mood is anxious and depressed.  Pt endorses passive SI denies HI/AVH and verbally agreed to seek staff for assistance if self harm thoughts increase or he is unable to maintain safe.  Gary [name] reports having a good appetite and bowel movement.  Pt did not attend group therapy sessions today.  Pt reported that he slept all day.  TW encouraged pt to attend at least 3 groups daily and verbalized understanding.  Q 15 min observation and RN hourly rounding maintained for pt safety, will continue to monitor for changes in mood and behavior.  Support provided as needed.      No acute events overnight.  Pt observed resting quietly in bed with eyes closed respirations even and unlabored for 8 hours @0600 .    "I, Gary [name], hereby attest that I have been made aware of and reviewed the following for this patient:     - Biopsychosocial assessment;  - Diagnoses;  - Nursing needs and medical support, if applicable; and  - Treatment Plan Goals, Objectives and Strategies/Interventions.     I am involved in the care of  this patient within my scope of practice and assigned tasks. I approve the treatment plan and this attestation is in lieu of a signature on the document."
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
## 2022-11-01 DIAGNOSIS — R45851 Suicidal ideations: Secondary | ICD-10-CM

## 2022-11-01 MED ORDER — HYDROXYZINE PAMOATE 25 MG PO CAPS
50.0000 mg | ORAL_CAPSULE | ORAL | Status: DC | PRN
Start: 2022-11-01 — End: 2022-11-05
  Administered 2022-11-01 – 2022-11-03 (×4): 50 mg via ORAL
  Filled 2022-11-01 (×3): qty 2
  Filled 2022-11-01: qty 1
  Filled 2022-11-01 (×2): qty 2

## 2022-11-01 NOTE — UM Notes (Signed)
[facility]  [address]  [place], [place] [address]  NPI: [id number]  Tax ID: [id number]        [name]   DOB: 1980-07-26  [facility] [facility]  ID: [id number]         Attending:  Dr. Graceann Congress, MD / NPI: [id number]    Inpatient CSR for 10/28/2022    Legal Status: Voluntary     7/14 Per chart note of NP Ogechukwu Igbo:  Patient was seen and evaluated at bedside, reports that he is doing well other than his anxious symptoms, although he is getting Vistaril 25 mg Q4H as needed.  He requested med to be increased to 50 mg to help manage his anxious symptoms better.  Patient reported improving depressive symptoms, reports sleeping well during the night although still fatigued with poor concentration.  Patient is compliant and tolerating his prescribed medications, denies side effects  Patient is assessed with no withdrawal symptoms, and has not received any PRNs for withdrawal from ETOH current CIWA score reported as 1.  Patient is encouraged to communicate needs to staff, and participate in groups to build on positive coping skills, no acute symptoms reported from overnight, every 15 minute safety checks maintained.     7/14 Per Nursing note:  Pt mood is anxious and depressed. Pt endorses passive SI denies HI/AVH and verbally agreed to seek staff for assistance if self harm thoughts increase or he is unable to maintain safe.     7/14 Per Nursing note:  Pt describes his mood as "tired." He reports his sleep as normal but reports his energy/appetite has been decreased. He rated his depression as 8/10 and anxiety as 3/10 (with 10 being the highest possible value).     Pt is on CIWA Q4 hrs, scored 3, 1, 2, and no sign and symptoms of withdrawn noted beside having mild anxiety.     7/14 VS:  T 98, HR 65, RR 16, bp 95/59, Pox 98%    7/14 Psychiatric Diagnoses per NP Ogechukwu Igbo:  Depression, unspecified  Rule out MDD vs substance-induced mood disorder  Social Anxiety  Disorder  Alcohol Use Disorder  Rule out PTSD    7/14 Mental Status Exam per NP Ogechukwu Igbo:  General appearance: Appears chronological age, Good hygiene, and Good grooming  Attitude/Behavior: Calm, Cooperative, and Eye Contact is  Good  Motor: No abnormalities noted  Gait: No obvious abnormalities  Muscle strength and tone: Grossly intact  Speech:  Spontaneous: Yes  Rate and Rhythm: Normal  Volume: Normal  Tone: Normal  Clarity: Yes  Mood: anxious  Affect:  Range: Constricted  Stability: Stable  Appropriateness to thought content: Yes  Intensity: Normal  Thought Process:  Coherent: Yes  Logical:  Yes  Associations: Goal-directed  Thought Content:  Delusions:  Not elicited  Depressive Cognitions:  Hopeless, Helpless, and Guilt  Suicidal:  No suicidal thoughts  Homicidal:  No homicidal thoughts  Violent Thoughts:  No  Perceptions:  Dissociative Phenomena:  No  Hallucinations: No  Insight: Fair  Judgment: Poor  Cognition:  Level of Consciousness: Intact  Orientation: Intact to self, place and time      7/15 Per chart note of NP Eldridge Dace:  Patient was seen and examined in his room. He continues to endorse severe depression with passive suicidal ideation but he denies any active plans nor intent at this time. He is tolerating mirtazapine and acamprosate well without adverse effects but he reports  minimal efficacy thus far. He has remained isolative and withdrawn to his room and he is uninterested in participating in group therapy secondary to social anxiety however he is receptive to encouragement to spend time in the milieu. He reports adequate sleep and appetite but endorses low energy and motivation. He denies HI/AVH at this time.     7/15 Per Nursing note:  A+Ox4, presents with blunted affect and pressured speech. Endorses passive SI with no intent, denies HI/AVH. BVC is 0. Pt encouraged to come to staff if thoughts or urges to harm self or others increases. Pt appears anxious, cooperative with Clinical research associate, able to  answer questions and maintain eye contact. Pt describes his mood as "okay".  Reports sleeping okay and some improved appetite.  [name] rated his depression as 10/10 and anxiety as 9/10. Maurico was adherent to scheduled medication, complained of 6/10 abdominal pain, prn Ibuprofen given.     7/15 Per Nursing note:  He denied HI/AVH, however, did admit to having frequent thoughts of wanting to end his life. When asked if he has a plan, he shook his head no.     He received PRN Vistaril 25 MG PO at 0900 for anxiety which he rated 8/10 using a rating scale between 1 and 10 with 10 being the highest.     7/15 VS:  T 97.7, HR 63, RR 16, bp 109/71, Pox 98%    7/15 Psychiatric Diagnoses per NP Eldridge Dace:   Depression, unspecified  Rule out MDD vs substance-induced mood disorder  Social Anxiety Disorder  Alcohol Use Disorder  Rule out PTSD     7/15 Mental Status Exam per NP Eldridge Dace:  General appearance: Appears chronological age, Poor hygiene, and Poor grooming  Attitude/Behavior: Calm, Cooperative, and Eye Contact is  Good  Motor: No abnormalities noted  Gait: Not observed  Muscle strength and tone: Not tested  Speech:  Spontaneous: Yes  Rate and Rhythm: Normal  Volume: Normal  Tone: Normal  Clarity: Yes  Mood: "depressed"  Affect:  Range: Constricted  Stability: Stable  Appropriateness to thought content: Yes  Intensity: Dull  Mood-congruent  Thought Process:  Coherent: Yes  Logical:  Yes at times  Associations: Goal-directed  Thought Content:  Delusions:  Not elicited  Depressive Cognitions:  Hopeless, Helpless, Worthless, and Guilt  Suicidal:  Suicidal thoughts  Homicidal:  No homicidal thoughts  Violent Thoughts:  No  +Passive SI, no intent or plan  Perceptions:  Hallucinations: No  Insight: Fair  Judgment: Poor  Cognition:  Level of Consciousness: Intact  Orientation: Intact to self, place and time    7/15 Assessment / Plan per NP Eldridge Dace:  Mirtazapine was increased from 15 mg to 30 mg qhs last night. Patient requests a  higher dose of PRN hydroxyzine, stating that he previously benefited from 50 mg (instead of 25 mg PRN). Will adjust dose of hydroxyzine and continue remaining medications. Will continue to encourage group and milieu participation.     7/15 Scheduled and PRN Meds:  Campral 666mg  po tid  Remeron 15mg  po qHS, start 7/12-->30mg  po qHS (7/14)  Vistaril 25mg  po q4hr prn (rec'd 1 dose on 7/13 & 1 dose on 7/15)-->50mg  po q4hr prn (7/15) (rec'd 1 dose on 7/15)  Seroquel 50mg  po qHS prn (rec'd 1 dose on 7/13)     7/15 Treatment plan:  Activity as tolerated  Suicide precautions  Close monitoring q checks        CIWA  VS bid  Medication management and stabilization  Group/individual therapy  Therapeutic Recreation eval and treat  D/c planning                 Rowan Blase, BSN, RN, [employer], ACM-RN          Utilization Review   [facility]  [address]  [address]  [place], [place] [address]  Phone: [phone] (VM)  [place]: [phone]  Fax: [phone]

## 2022-11-01 NOTE — Plan of Care (Incomplete)
Problem: IP Suicidal Ideation  Description: Corgan endorses suicidal ideation with plan to jump off bridge  Goal: LTG: Jandiel will exhibit compliance with therapy  Outcome: Not Progressing  Goal: STG: Algie will identify 3 internal coping strategies  Outcome: Not Progressing   Ayvion was A+Ox4 with *** affect and *** speech. *** SI/HI/AVH/SIB urges. BVC is ***. Pt states they *** committed to maintaining their safety, encouraged to come to staff if thoughts or urges to harm self or others increases, Jerad verbalizes understanding. Pt is *** and cooperative with Clinical research associate, able to answer questions and maintain *** eye contact. Pt describes their mood as ***. *** sleep/energy/appetite.  Arjen rated their depression as *** and anxiety as *** (with 10 being the highest possible value). Adian states their goal for today is ***. Mylik *** compliant with medication administration and mouth check completed. Tajah was *** in the milieu throughout the shift, *** and participated in groups, and *** with peers and staff appropriately. Q15 min safety checks maintained. Will continue to monitor and ensure safety.  PRNs? Why? ***      "I, ***, hereby attest that I have been made aware of and reviewed the following for this patient:    - Biopsychosocial assessment;  - Diagnoses;  - Nursing needs and medical support, if applicable; and  - Treatment Plan Goals, Objectives and Strategies/Interventions.    I am involved in the care of this patient within my scope of practice and assigned tasks. I approve the treatment plan and this attestation is in lieu of a signature on the document."

## 2022-11-01 NOTE — Progress Notes (Signed)
Attendance Note:  Patient attended  2/5 groups today. Please see group notes for details.

## 2022-11-01 NOTE — Group Note (Signed)
Group: BH - Music Therapy  Group Topics: Experiential Music Therapy   Group Date: 11/01/2022  Start Time: 1300  End Time: 1400  Number of Participants: 5  Facilitators: Yehuda Savannah   Department: [facility] [address]    Discipline Led: Music Therapy  Number of Patient Participants: 5    Music was presented as an outlet for expression and engagement. Activities were tailored according to participants' level of need and interest. Participants elected to engage in preferred music listening and active instrument play. Equipment utilized during the session included guitar, computer, midi-keyboard, and bluetooth speaker.      Name: [name] Date of Birth: [birth year]   MR: [id number]      Level of Participation: withdrawn  Quality of Participation: engaged and passive  Interactions with Others:  pt did not interact with others. Counselor advised MT and MTI to not directly engage with pt or call on him due to social anxiety.  Mood/Affect: anxious and closed / guarded  Triggers (if applicable): -  Cognition: -  Organization: -  Progress: Moderate  Response: Pt joined session around 10 minutes into group. He stood by the doorway, and did not enter session space at any time. Counselor advised MT and MTI that pt had social anxiety and did not want to be called on. MT facilitated session in a way to provide pt with a space to engage passively. He stood at different distances from the door, and was present for most of the music listening portion of the group (30 minutes). Pt left as the group transitioned a more active/engaged activity (bell playing). He continued to present with a withdrawn manner, and a euthymic affect when he left session. Music was presented as a safe way for pt to engage in group.     Plan: Continue attending music therapy.     Patients Problems:   Patient Active Problem List   Diagnosis    Suicidal ideation      Yehuda Savannah, MA, MT-BC  Music Therapist-Board  Certified   [email or web address]

## 2022-11-01 NOTE — Plan of Care (Addendum)
Problem: IP Suicidal Ideation  Description: Kaelyn endorses suicidal ideation with plan to jump off bridge  Goal: LTG: Yeng will exhibit compliance with therapy  Outcome: Progressing  Destin is compliant with therapy  Goal: STG: Shanna will not engage in self-injurious activities  Outcome: Progressing    Vraj has engaged in self harm activities   Problem: Addiction to alcohol or opioids or other substances AS EVIDENCED BY:  Description: Michah endorses daily alcohol use  Goal: Patient achieves a safe detoxification and management of withdrawal symptoms  Outcome: Progressing   Frederich has not exhibited withdrawal symptoms        Brecken was A+Ox4, presents with blunted affect and pressured speech. Endorses passive SI with no intent, denies HI/AVH. BVC is 0. Pt encouraged to come to staff if thoughts or urges to harm self or others increases. Pt appears anxious, cooperative with Clinical research associate, able to answer questions and maintain eye contact. Pt describes his mood as "okay".  Reports sleeping okay and some improved appetite.  Deral rated his depression as 10/10 and anxiety as 9/10. Dayon was adherent to scheduled medication, complained of 6/10 abdominal pain, prn Ibuprofen given. Adain was isolative to self, reading books, interacted with staff appropriately. Pt was assessed for alcohol withdrawal symptoms every 6 hours, not scoring. Q15 min safety checks maintained. Will continue to monitor and ensure safety.     Pt observed in bed, eyes closed, no apparent distress noted, slept for 7 hours.

## 2022-11-01 NOTE — Progress Notes (Addendum)
SW spoke with Pt at bedside to check in. Pt mentioned uncertainty about re-engaging in rehab due to having undergone it three times already and harboring doubts about its effectiveness. He also expressed significant anxiety in group settings, questioning its productivity for him.  Additionally, he expressed interest in finding a shelter nearby and requested assistance. SW stated they can discuss this further tomorrow, which he agreed to. He was later seen attending a group session where he felt comfortable as it did not require active participation.     Gary [name], MSW

## 2022-11-01 NOTE — Progress Notes (Signed)
Psychiatry Progress Note  (Level 1 = Problem Focused, Level 2 = Expanded Problem Focused, Level 3 = Detailed)    Patient Name: Gary [name].            Current Date/Time:  7/15/20241:55 PM  MRN:  [id number]                            Attending Physician: Farris Has, MD  DOB: [birth year]                                Gender: male                              I. History   Informants: Patient, medical record, treatment team   A. Chief Complaint or Reason for Admission       Admitted for worsening depression and suicidal ideation.    B.History of Present Illness: Interval History     (Symptoms and qualifiers:1 for Level 1, 2 for Level 2 and 3+ for Level 3)  Patient is a 42 y.o. divorced, un-domiciled male with history of depression, anxiety, and alcohol use disorder admitted for worsening depression and suicidal ideation with a plan to jump off a bridge. Patient was evaluated by the [facility] and is currently admitted voluntarily.     November 01, 2022  Patient was seen and examined in his room. He continues to endorse severe depression with passive suicidal ideation but he denies any active plans nor intent at this time. He is tolerating mirtazapine and acamprosate well without adverse effects but he reports minimal efficacy thus far. He has remained isolative and withdrawn to his room and he is uninterested in participating in group therapy secondary to social anxiety however he is receptive to encouragement to spend time in the milieu. He reports adequate sleep and appetite but endorses low energy and motivation. He denies HI/AVH at this time.     ITP:   Short term goals: start feeling better, spend a little more time outside of the room  Long term goal: get back to work    C.Medical History  Review of Systems  (Level 1 is none, Level 2 is 1, and Level 3 is 2+)  A complete 14 point ROS was done and was negative except for  Psychiatric: Depression, Anxiety, and Suicidal    D.Additional Past Psychiatric, Substance  Use, Medical, Family and Social History  Psychiatric: No new information available as compared to previous encounter(s)  Substance Use: No new information available as compared to previous encounter(s)  Medical: No new information available as compared to previous encounter(s)  Family: No new information available as compared to previous encounter(s)  Social: No new information available as compared to previous encounter(s)    Medications:   Current Medications       Scheduled       Medication Ordered Dose/Rate, Route, Frequency Last Action    acamprosate (CAMPRAL) tablet 666 mg    666 mg, PO, TID Given, 666 mg at 07/15 1350    folic acid (FOLVITE) tablet 1 mg    1 mg, PO, Daily Given, 1 mg at 07/15 0855    mirtazapine (REMERON) tablet 30 mg    30 mg, PO, QHS Given, 30 mg at 07/14 2111    thiamine (VITAMIN B1) tablet 100 mg  100 mg, PO, Daily Given, 100 mg at 07/15 0855    vitamins/minerals tablet 1 tablet    1 tablet, PO, Daily Given, 1 tablet at 07/15 0855              PRN       Medication Ordered Dose/Rate, Route, Frequency Last Action    acetaminophen (TYLENOL) tablet 650 mg    650 mg, PO, Q6H PRN Ordered    alum & mag hydroxide-simethicone (MAALOX PLUS) 200-200-20 mg/5 mL suspension 30 mL    30 mL, PO, Q6H PRN Ordered    diphenhydrAMINE (BENADRYL) capsule 50 mg (Or Linked Group #1)    50 mg, PO, Once PRN Ordered    diphenhydrAMINE (BENADRYL) injection 50 mg (Or Linked Group #1)    50 mg, IM, Once PRN Ordered    hydrOXYzine (VISTARIL) capsule 50 mg    50 mg, PO, Q4H PRN Given, 50 mg at 07/15 1350    ibuprofen (ADVIL) tablet 800 mg    800 mg, PO, Q6H PRN Given, 800 mg at 07/14 2111    LORazepam (ATIVAN) tablet 1 mg (Followed by Linked Group #2)    1 mg, PO, Q8H PRN Ordered    LORazepam (ATIVAN) tablet 1 mg (Followed by Linked Group #2)    1 mg, PO, Q6H PRN Ordered    OLANZapine (ZyPREXA) tablet 10 mg    10 mg, PO, BID PRN Ordered    QUEtiapine (SEROquel) tablet 50 mg    50 mg, PO, QHS PRN Given, 50 mg at 07/13  2204                    II. Examination   Vital signs reviewed:   Blood pressure 109/71, pulse 63, temperature 97.7 F (36.5 C), temperature source Oral, resp. rate 16, height 1.702 m (5\' 7" ), weight 68 kg (150 lb), SpO2 98%.     Mental Status Exam  (Level 1 is 1-5, Level 2 is 6-8, Level 3 is 9+)  General appearance: Appears chronological age, Poor hygiene, and Poor grooming  Attitude/Behavior: Calm, Cooperative, and Eye Contact is  Good  Motor: No abnormalities noted  Gait: Not observed  Muscle strength and tone: Not tested  Speech:   Spontaneous: Yes  Rate and Rhythm: Normal  Volume: Normal  Tone: Normal  Clarity: Yes  Mood: "depressed"  Affect:   Range: Constricted  Stability: Stable  Appropriateness to thought content: Yes  Intensity: Dull  Mood-congruent  Thought Process:   Coherent: Yes  Logical:  Yes at times  Associations: Goal-directed  Thought Content:   Delusions:  Not elicited  Depressive Cognitions:  Hopeless, Helpless, Worthless, and Guilt  Suicidal:  Suicidal thoughts  Homicidal:  No homicidal thoughts  Violent Thoughts:  No  +Passive SI, no intent or plan  Perceptions:   Hallucinations: No  Insight: Fair  Judgment: Poor  Cognition:   Level of Consciousness: Intact  Orientation: Intact to self, place and time    Psychiatric / Cognitive Instruments: None    Pertinent Physical Exam: Not relevant to current chief complaint/reason for admission    Imaging / EKG / Labs: Labs in the last 24 hours  Results       Procedure Component Value Units Date/Time    Urinalysis with Microscopic Exam (Order) [[id number]]  (Abnormal) Collected: 10/31/22 1421    Specimen: Urine, Clean Catch Updated: 10/31/22 2101    Narrative:      The following orders were created for panel order Urinalysis  with Microscopic Exam (Order).  Procedure                               Abnormality         Status                     ---------                               -----------         ------                     Urinalysis with  Microsco..Marland Kitchen[[id number]]  Abnormal            Final result               Urine [employer] .Marland KitchenMarland Kitchen[[id number]]                      Final result                 Please view results for these tests on the individual orders.    Urine [employer] Tube [[id number]] Collected: 10/31/22 1421    Specimen: Urine, Clean Catch Updated: 10/31/22 2101     Extra Tube Hold for add-ons.    Urinalysis with Microscopic Exam (Component) [[id number]]  (Abnormal) Collected: 10/31/22 1421    Specimen: Urine, Clean Catch Updated: 10/31/22 2035     Urine Color Straw     Urine Clarity Clear     Urine Specific Gravity 1.018     Urine pH 6.0     Urine Leukocyte Esterase Negative     Urine Nitrite Negative     Urine Protein Negative     Urine Glucose Negative     Urine Ketones Negative mg/dL      Urine Urobilinogen Normal mg/dL      Urine Bilirubin Negative     Urine Blood Negative     RBC, UA 0-2 /hpf      Urine WBC 0-5 /hpf      Urine Squamous Epithelial Cells 0-5 /hpf      Urine Calcium Oxalate Crystals Occasional /hpf      Urine Mucus Present          EKG Results   Cardiology Results       Procedure Component Value Units Date/Time    ECG 12 lead (Electrocardiogram) [[id number]] Collected: 10/28/22 1740     Updated: 10/28/22 1753     Ventricular Rate 103 BPM      Atrial Rate 103 BPM      P-R Interval 148 ms      QRS Duration 98 ms      Q-T Interval 334 ms      QTC Calculation (Bezet) 437 ms      P Axis 47 degrees      R Axis 49 degrees      T Axis 37 degrees      IHS MUSE NARRATIVE AND IMPRESSION --     SINUS TACHYCARDIA  OTHERWISE NORMAL ECG  NO PREVIOUS ECGS AVAILABLE  Confirmed by Neomia Dear MD, Juandaniel ([id number]) on 10/28/2022 5:53:08 PM      Narrative:      SINUS TACHYCARDIA  OTHERWISE NORMAL ECG  NO PREVIOUS ECGS AVAILABLE  Confirmed by Neomia Dear MD, Axyl (31) on 10/28/2022 5:53:08  PM          Brain Scan No results found for any visits on 10/28/22.    III. Assessment and Plan (Medical Decision Making)     1. I certify that this patient continues to  require inpatient hospitalization due to acute risk to self and unable to care for self in the community with insufficient support available    2. Psychiatric Diagnoses  Depression, unspecified  Rule out MDD vs substance-induced mood disorder  Social Anxiety Disorder  Alcohol Use Disorder  Rule out PTSD    3. Additional Diagnoses          4.All diagnostic procedures completed since admission were reviewed. Past medical records reviewed. Coordination of care was discussed with inpatient team and as available with the outpatient team.    5. On this admission patient educated about and provided input into their treatment plan.  Patient understands potential risks and benefits of proposed treatment plan.     6.  Assessment / Impression  Patient is a 42 y.o. divorced, un-domiciled male with history of depression, anxiety, and alcohol use disorder admitted for worsening depression and suicidal ideation with a plan to jump off a bridge. Patient was evaluated by the [facility] and is currently admitted voluntarily.     Patient reports minimal improvement in symptoms thus far. He continues to endorse low mood with multiple neurovegetative symptoms and passive suicidal ideation. He also reports ongoing alcohol cravings despite acamprosate. He has not been scoring on CIWA.     Mirtazapine was increased from 15 mg to 30 mg qhs last night. Patient requests a higher dose of PRN hydroxyzine, stating that he previously benefited from 50 mg (instead of 25 mg PRN). Will adjust dose of hydroxyzine and continue remaining medications. Will continue to encourage group and milieu participation.     At this time, the patient would continue to benefit from inpatient psychiatric hospitalization for safety, stabilization, diagnostic clarification, medication management, and discharge planning.    Suicide Risk Assessment   Suicide Risk Assessment performed? Yes: Suicide Thoughts / Behaviors: Yes  Current Plan: None  Access to firearm: No    Plan /  Recommendations:    Biological Plan:  Medications:   Mirtazapine 30 mg qhs (increased on 7/14)  Acamprosate 666 mg TID  Increase PRN hydroxyzine from 25 mg to 50 mg q4h PRN  Scheduled Meds:  Current Facility-Administered Medications   Medication Dose Route Frequency    acamprosate  666 mg Oral TID    folic acid  1 mg Oral Daily    mirtazapine  30 mg Oral QHS    thiamine  100 mg Oral Daily    vitamins/minerals  1 tablet Oral Daily     Continuous Infusions:  PRN Meds:.acetaminophen, alum & mag hydroxide-simethicone, diphenhydrAMINE **OR** diphenhydrAMINE, hydrOXYzine, ibuprofen, OLANZapine, QUEtiapine    Medical Work-up:  as per unit FNP  Consults:  as per unit FNP    Psychosocial Plan:  Individual therapy: Psycho-education and psychotherapy of the following modalities will continue to be provided during daily visits:Supportive, Cognitive Behavioral Therapy, and Insight Oriented Psychotherapy  Group and milieu therapies: daily per unit's schedule.  Continue with Social Work intervention provided for discharge planning including assistance with placement and follow-up psychiatric care.    Plan for Family Involvement: None Available  Other Providers Contact Information and Dates Contacted: No Updates    I personally saw and examined the patient and spent 25 minutes on this visit reviewing previous notes, counseling the  patient and/or family members regarding the patient's condition(s), ordering of tests, managing medications, and documenting the findings in the note.   Patient was also seen by Dr. Forestine Chute. The assessment and plan were discussed and formulated together and has been reviewed and updated as needed.      Signed by: Eldridge Dace, NP  11/01/2022  1:55 PM    I provided the substantive portion of this visit by providing more than 50% of the total time. I personally saw and examined the patient and spent 25 minutes. Patient was also seen by Eldridge Dace, NP who spent 25 minutes. I reviewed their note and updated the  documented findings and plan of care accordingly. I agree with the above assessment and plan.

## 2022-11-01 NOTE — Plan of Care (Signed)
Problem: IP Suicidal Ideation  Description: [place] endorses suicidal ideation with plan to jump off bridge  Goal: LTG: [name] will exhibit compliance with therapy  Outcome: Not Progressing:  [name] was compliant with his scheduled medications, however, he did not attend a group session during this Day Shift.  Goal: STG: [name] will not engage in self-injurious activities  Outcome: Progressing:  [name] did not engage in self-injurious activities during this Day Shift.     Problem: Addiction to alcohol or opioids or other substances AS EVIDENCED BY:  Description: [name] endorses daily alcohol use  Goal: Patient achieves a safe detoxification and management of withdrawal symptoms  Description: Interventions:  1. Assess withdrawal signs/symptoms according to identified protocol (e.g. CIWA/COWS)  2. Provide medication teaching including name, dosage, benefits, action, effect and side effects  3. Assess effectiveness (relief of withdrawal symptoms) and side effects of medication  4. Educate about health risks associated with withdrawal (e.g. seizures, DTs)  5. Ensure laboratory results are reviewed with the LIP to increase understanding of the medical consequences of addiction  6. Ensure adequate hydration and nutrition during withdrawal  7. Educate about the importance of reporting dizziness or unsteadiness while ambulating to care providers (e.g. RN, LIP)  Outcome: Progressing:  [name] achieved a safe detoxification and management of withdrawal symptoms during this Day Shift.     Upon entering his room this morning, Gary [name] was awake lying in bed.  He was receptive to greeting and introduction from [name].  He presented with a flat affect.  He maintained good eye contact during Daily Shift Assessment.  He is A+O to Person, Place, and Time.  He stated his name and DOB prior to receiving his scheduled AM medications which also included Campral 666 MG PO.  He denied HI/AVH, however, did admit to having frequent thoughts of wanting to end his  life.  When asked if he has a plan, he shook his head no.  He is able to contract to safety.  RN instructed him to please let Nursing Staff know if these thoughts become overwhelming and unmanageable, which, he verbalized agreement to do.  When asked how he manages these thoughts, he stated, "Reading and staying in my room."  When asked if he has difficulty concentrating with reading, he stated, "A little bit."  He received PRN Vistaril 25 MG PO at 0900 for anxiety which he rated 8/10 using a rating scale between 1 and 10 with 10 being the highest.  RN approached Psychiatric NP requesting that PRN Vistaril be increased to 50 MG for ineffectiveness of 25 MG.  RN will follow through with this.  Using this same scale, he rated his depression level 10/10.  He reported sleeping "okay" last night.  His appetite is "good."  His energy level is "low."  Chancellor is being monitored every 6 hours for alcohol withdrawal using CIWA, which, scored 0 this morning.  He has been in his room resting despite encouragement to attend one group session earlier this morning.  He has been pleasant when approached.  He made his needs known politely.  He has not attended to his ADL's at this time.  Kionte has maintained safety.  RN will monitor for continued safe behavior.  "I, Gary [name], hereby attest that I have been made aware of and reviewed the following for this patient:    - Biopsychosocial assessment;  - Diagnoses;  - Nursing needs and medical support, if applicable; and  - Treatment Plan Goals, Objectives and Strategies/Interventions.  I am involved in the care of this patient within my scope of practice and assigned tasks. I approve the treatment plan and this attestation is in lieu of a signature on the document."

## 2022-11-01 NOTE — Group Note (Signed)
Group: BH - Creative  Group Topics: Creative  Group Date: 11/01/2022  Start Time: 1530  End Time: 1630  Number of Participants: 5  Facilitators: Colon Branch   Department: [facility] [address]    Discipline Led: BHT  Number of Patient Participants: 5      Name: Gary [name]. Date of Birth: 1980-07-29   MR: [id number]      Level of Participation: minimal  Quality of Participation: cooperative  Interactions with Others:  appropriate with TW, no interaction with peers  Mood/Affect: appropriate  Triggers (if applicable): n/a  Cognition: appropriate for group  Organization: completed task  Progress: Some-Pt is now leaving his room  Response: With encouragement ,pt joined creative therapy.  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  He did not engage with peers though engaged in appropriate discussion with TW.  Pt has previously endorsed social anxiety.  Plan: patient will be encouraged to continue attending groups and increase engagement.    Patients Problems:   Patient Active Problem List   Diagnosis    Suicidal ideation

## 2022-11-01 NOTE — Care and Service Plan (Signed)
Interdisciplinary Treatment Plan Update Meeting    11/01/2022  [name]    Participants:  Patient:  Gary [name].  RN: Margie Ege  Mental Health Therapist:  Colon Branch  Social Worker: Forestine Na  Other: Eldridge Dace, NP    Short Term Goal:  "Start feeling better and spend a little more time outside of the room."  Long Term Goal:  "Get back to work."    Objective:  Review response to treatment, reassess needs/goals, update plan as indicated incorporating patient's strengths and stated needs, goals, and preferences.      1. Summary of Patient Progress on Treatment Plan Goals:      2. Level of Patient Involvement:  Actively engaged/contributing    3. Patient Understanding of Plan of Care:  Able to verbalize goals and interventions    4. Level of Agreement/Commitment to Plan of Care:  Agrees with and is committed to plan of care

## 2022-11-02 MED ORDER — QUETIAPINE FUMARATE 25 MG PO TABS
25.0000 mg | ORAL_TABLET | Freq: Every evening | ORAL | Status: DC | PRN
Start: 2022-11-02 — End: 2022-11-03

## 2022-11-02 MED ORDER — MIRTAZAPINE 15 MG PO TABS
45.0000 mg | ORAL_TABLET | Freq: Every evening | ORAL | Status: DC
Start: 2022-11-03 — End: 2022-11-17
  Administered 2022-11-03 – 2022-11-16 (×14): 45 mg via ORAL
  Filled 2022-11-02 (×14): qty 3

## 2022-11-02 MED ORDER — PANTOPRAZOLE SODIUM 40 MG PO TBEC
40.0000 mg | DELAYED_RELEASE_TABLET | Freq: Every morning | ORAL | Status: DC
Start: 2022-11-02 — End: 2022-11-17
  Administered 2022-11-03 – 2022-11-17 (×15): 40 mg via ORAL
  Filled 2022-11-02 (×15): qty 1

## 2022-11-02 MED ORDER — MIRTAZAPINE 15 MG PO TABS
30.0000 mg | ORAL_TABLET | Freq: Every evening | ORAL | Status: AC
Start: 2022-11-02 — End: 2022-11-02
  Administered 2022-11-02: 30 mg via ORAL
  Filled 2022-11-02: qty 2

## 2022-11-02 MED ORDER — POLYETHYLENE GLYCOL 3350 17 G PO PACK
17.0000 g | PACK | Freq: Every day | ORAL | Status: DC
Start: 2022-11-02 — End: 2022-11-14
  Administered 2022-11-02 – 2022-11-10 (×6): 17 g via ORAL
  Filled 2022-11-02 (×12): qty 1

## 2022-11-02 MED ORDER — QUETIAPINE FUMARATE 25 MG PO TABS
25.0000 mg | ORAL_TABLET | Freq: Every evening | ORAL | Status: DC | PRN
Start: 2022-11-02 — End: 2022-11-07
  Administered 2022-11-03 – 2022-11-06 (×2): 25 mg via ORAL
  Filled 2022-11-02 (×3): qty 1

## 2022-11-02 NOTE — Progress Notes (Addendum)
During treatment team meeting it was reported that Pt has many allergies and is experiencing sensitivities to some of his medications. Currently, the Pt doctor is adjusting his medication as necessary. The Pt also wishes to find a nearby shelter, however he is covered by [facility], so that needs to be taken into consideration.     SW met with Pt at bedside to discuss his aftercare plan. SW encouraged Pt to contact the list of homeless shelters given to him to check availability and see if he can be added to the lit. Additionally, the SW encouraged him to go to the [facility] and obtain his  state ID after discharge. Pt stated he is still deciding whether he wants to re-engage in rehab treatment.     Tyna Jaksch, MSW

## 2022-11-02 NOTE — Group Note (Signed)
Group: BH - Creative  Group Topics: Expressive   Group Date: 11/02/2022  Start Time: 1500  End Time: 1600  Number of Participants: 4  Facilitators: Tonette Lederer T   Department: [facility] [address]    Discipline Led: BHT  Number of Patient Participants: 4      Name: Gary [name]. Date of Birth: [birth year]   MR: [id number]      Level of Participation: active  Quality of Participation: cooperative  Interactions with Others: supportive  Mood/Affect: brightens with interaction  Triggers (if applicable): N/A  Cognition: logical  Organization: completed task  Progress: Significant  Response: Pt was able to stay until the end of group, pt observed other peers, listen, and sing alone to music. Pt left right when it turn 4.   Plan: patient will be encouraged to attends and participates in all groups.     Patients Problems:   Patient Active Problem List   Diagnosis    Suicidal ideation

## 2022-11-02 NOTE — Plan of Care (Signed)
Problem: IP Suicidal Ideation  Description: C[place]le endorses suicidal ideation with plan to jump off bridge  Goal: STG: Sheppard will identify 3 internal coping strategies  Outcome: Not Progressing     Problem: IP Suicidal Ideation  Description: [place] endorses suicidal ideation with plan to jump off bridge  Goal: LTG: Gino will exhibit compliance with therapy  Outcome: Progressing  Arval complied to his medications  Goal: STG: Akiva will not engage in self-injurious activities  Outcome: Progressing  Addis denies SI.       Iosefa was A+Ox3 with blunt affect and clear speech. He denies SI/HI/AVH/SIB urges. He is calm and cooperative with Clinical research associate, able to answer questions and maintain good eye contact. He describes his mood as "well".  Aleph rated  his depression 9/10 and anxiety 7/10. Emari is compliant with medication administration and mouth check completed. Eliazar was able to ambulate in the milieu and make his needs known. He was able to rest comfortably inside his room and appeared to have slept for 7 hours with no respiratory distress noted. Q15 min safety checks maintained. Will continue to monitor and ensure safety.

## 2022-11-02 NOTE — Progress Notes (Signed)
Psychiatry Progress Note  (Level 1 = Problem Focused, Level 2 = Expanded Problem Focused, Level 3 = Detailed)    Patient Name: Gary [name].            Current Date/Time:  7/16/202410:37 AM  MRN:  [id number]                            Attending Physician: Farris Has, MD  DOB: 1980/07/11                                Gender: male                              I. History   Informants: Patient, medical record, treatment team   A. Chief Complaint or Reason for Admission       Admitted for worsening depression and suicidal ideation.    B.History of Present Illness: Interval History     (Symptoms and qualifiers:1 for Level 1, 2 for Level 2 and 3+ for Level 3)  Patient is a 42 y.o. divorced, un-domiciled male with history of depression, anxiety, and alcohol use disorder admitted for worsening depression and suicidal ideation with a plan to jump off a bridge. Patient was evaluated by the [facility] and is currently admitted voluntarily.     November 02, 2022  Patient seen and evaluated along with attending psychiatrist, case discussed with attending psychiatrist/treatment team, interim charting, nursing, and therapy notes reviewed. No episodes of acute behavioral events or agitation on the unit. Compliant with medications and tolerating well; No side effects reported. VS Stable.     Patient was seen and examined in his room. He reports his mood is the same. He continues to endorse severe depression with passive suicidal ideation but he denies any active plans nor intent at this time. He feels like he has no reason to be alive and does not want to be. He denies HI/AVH at this time. He continues to have negative thoughts all day long. He attempts to self-soothe by taking hot showers, looking out of the window, and practicing self-talk. He reports low energy and motivation, and moderate difficulty concentrating. He is tolerating mirtazapine and acamprosate well without adverse effects but he reports minimal efficacy thus  far. He has remained isolative and withdrawn to his room and he is uninterested in participating in group therapy secondary to social anxiety however he is receptive to encouragement to spend time in the milieu. He reports difficulty sleeping last night, noting he only slept 2 hours. Encouraged to use PRN sleep aid; Seroquel 25mg  with additional dose of needed. He noted PRN Seroquel 50mg  was too sedating. Reports good appetite. C/o of constipation which is he attributes to Campral, which treatment team discussed is unlikely. Dicussed continuing Campral and adding a stool softener to address constipation; patient agreeable. Notes ongoing alcohol cravings. Maintaining ADLs.     ITP on 7/15:   Short term goals: start feeling better, spend a little more time outside of the room  Long term goal: get back to work    C.Medical History  Review of Systems  (Level 1 is none, Level 2 is 1, and Level 3 is 2+)  A complete 14 point ROS was done and was negative except for  Psychiatric: Depression, Anxiety, and Suicidal    D.Additional Past  Psychiatric, Substance Use, Medical, Family and Social History  Psychiatric: No new information available as compared to previous encounter(s)  Substance Use: No new information available as compared to previous encounter(s)  Medical: No new information available as compared to previous encounter(s)  Family: No new information available as compared to previous encounter(s)  Social: No new information available as compared to previous encounter(s)    Medications:   Current Medications       Scheduled       Medication Ordered Dose/Rate, Route, Frequency Last Action    acamprosate (CAMPRAL) tablet 666 mg    666 mg, PO, TID Given, 666 mg at 07/15 1350    folic acid (FOLVITE) tablet 1 mg    1 mg, PO, Daily Given, 1 mg at 07/16 0850    mirtazapine (REMERON) tablet 30 mg    30 mg, PO, QHS Given, 30 mg at 07/15 2133    thiamine (VITAMIN B1) tablet 100 mg    100 mg, PO, Daily Given, 100 mg at 07/16 0850     vitamins/minerals tablet 1 tablet    1 tablet, PO, Daily Given, 1 tablet at 07/16 0850              PRN       Medication Ordered Dose/Rate, Route, Frequency Last Action    acetaminophen (TYLENOL) tablet 650 mg    650 mg, PO, Q6H PRN Ordered    alum & mag hydroxide-simethicone (MAALOX PLUS) 200-200-20 mg/5 mL suspension 30 mL    30 mL, PO, Q6H PRN Ordered    diphenhydrAMINE (BENADRYL) capsule 50 mg (Or Linked Group #1)    50 mg, PO, Once PRN Ordered    diphenhydrAMINE (BENADRYL) injection 50 mg (Or Linked Group #1)    50 mg, IM, Once PRN Ordered    hydrOXYzine (VISTARIL) capsule 50 mg    50 mg, PO, Q4H PRN Given, 50 mg at 07/15 2140    ibuprofen (ADVIL) tablet 800 mg    800 mg, PO, Q6H PRN Given, 800 mg at 07/15 1730    OLANZapine (ZyPREXA) tablet 10 mg    10 mg, PO, BID PRN Ordered    QUEtiapine (SEROquel) tablet 50 mg    50 mg, PO, QHS PRN Given, 50 mg at 07/13 2204                    II. Examination   Vital signs reviewed:   Blood pressure 106/71, pulse 65, temperature 97.5 F (36.4 C), temperature source Oral, resp. rate 16, height 1.702 m (5\' 7" ), weight 68 kg (150 lb), SpO2 96%.     Mental Status Exam  (Level 1 is 1-5, Level 2 is 6-8, Level 3 is 9+)  General appearance: Appears chronological age, Poor hygiene, and Poor grooming  Attitude/Behavior: Calm, Cooperative, and Eye Contact is  Good  Motor: No abnormalities noted  Gait: Not observed  Muscle strength and tone: Not tested  Speech:   Spontaneous: Yes  Rate and Rhythm: Normal  Volume: Normal  Tone: Normal  Clarity: Yes  Mood: "depressed"  Affect:   Range: Constricted  Stability: Stable  Appropriateness to thought content: Yes  Intensity: Dull  Mood-congruent  Thought Process:   Coherent: Yes  Logical:  Yes at times  Associations: Goal-directed  Thought Content:   Delusions:  Not elicited  Depressive Cognitions:  Hopeless, Helpless, Worthless, and Guilt  Suicidal:  Suicidal thoughts  Homicidal:  No homicidal thoughts  Violent Thoughts:  No  +Passive  SI,  no intent or plan  Perceptions:   Hallucinations: No  Insight: Fair  Judgment: Poor  Cognition:   Level of Consciousness: Intact  Orientation: Intact to self, place and time    Psychiatric / Cognitive Instruments: None    Pertinent Physical Exam: Not relevant to current chief complaint/reason for admission    Imaging / EKG / Labs: Labs in the last 24 hours  Results       ** No results found for the last 24 hours. **          EKG Results   Cardiology Results       Procedure Component Value Units Date/Time    ECG 12 lead (Electrocardiogram) [[id number]] Collected: 10/28/22 1740     Updated: 10/28/22 1753     Ventricular Rate 103 BPM      Atrial Rate 103 BPM      P-R Interval 148 ms      QRS Duration 98 ms      Q-T Interval 334 ms      QTC Calculation (Bezet) 437 ms      P Axis 47 degrees      R Axis 49 degrees      T Axis 37 degrees      [facility] MUSE NARRATIVE AND IMPRESSION --     SINUS TACHYCARDIA  OTHERWISE NORMAL ECG  NO PREVIOUS ECGS AVAILABLE  Confirmed by Neomia Dear MD, Bohdan ([id number]) on 10/28/2022 5:53:08 PM      Narrative:      SINUS TACHYCARDIA  OTHERWISE NORMAL ECG  NO PREVIOUS ECGS AVAILABLE  Confirmed by Neomia Dear MD, Nicky ([id number]) on 10/28/2022 5:53:08 PM          Brain Scan No results found for any visits on 10/28/22.    III. Assessment and Plan (Medical Decision Making)     1. I certify that this patient continues to require inpatient hospitalization due to acute risk to self and unable to care for self in the community with insufficient support available    2. Psychiatric Diagnoses  Depression, unspecified  Rule out MDD vs substance-induced mood disorder  Social Anxiety Disorder  Alcohol Use Disorder  Rule out PTSD    3. Additional Diagnoses          4.All diagnostic procedures completed since admission were reviewed. Past medical records reviewed. Coordination of care was discussed with inpatient team and as available with the outpatient team.    5. On this admission patient educated about and provided input into their  treatment plan.  Patient understands potential risks and benefits of proposed treatment plan.     6.  Assessment / Impression  Patient is a 42 y.o. divorced, un-domiciled male with history of depression, anxiety, and alcohol use disorder admitted for worsening depression and suicidal ideation with a plan to jump off a bridge. Patient was evaluated by the [facility] and is currently admitted voluntarily.     Patient reports minimal improvement in symptoms thus far. He continues to endorse low mood with multiple neurovegetative symptoms and passive suicidal ideation. He also reports ongoing alcohol cravings despite acamprosate. CIWA discontinued yesterday.     11/02/22: Similar presentation to yesterday; patient continues to endorse depressed mood with passive suicidal ideation/death wishes. C/o of constipation and difficulty sleeping last night. Miralax ordered and encouraged to use PRN Seroquel for sleep aid. Plan to increased Remeron to 45mg  starting tomorrow for more therapeutic dosing to target depression. Will continue to encourage group and milieu participation.  At this time, the patient would continue to benefit from inpatient psychiatric hospitalization for safety, stabilization, diagnostic clarification, medication management, and discharge planning.    Suicide Risk Assessment   Suicide Risk Assessment performed? Yes: Suicide Thoughts / Behaviors: Yes  Current Plan: None  Access to firearm: No    Plan / Recommendations:    Biological Plan:  Medications:   Mirtazapine 30 mg qhs (increased on 7/14)  Acamprosate 666 mg TID  Increase PRN hydroxyzine from 25 mg to 50 mg q4h PRN  Scheduled Meds:  Current Facility-Administered Medications   Medication Dose Route Frequency    acamprosate  666 mg Oral TID    folic acid  1 mg Oral Daily    mirtazapine  30 mg Oral QHS    thiamine  100 mg Oral Daily    vitamins/minerals  1 tablet Oral Daily     Continuous Infusions:  PRN Meds:.acetaminophen, alum & mag  hydroxide-simethicone, diphenhydrAMINE **OR** diphenhydrAMINE, hydrOXYzine, ibuprofen, OLANZapine, QUEtiapine    Medical Work-up:  as per unit FNP  Consults:  as per unit FNP    Psychosocial Plan:  Individual therapy: Psycho-education and psychotherapy of the following modalities will continue to be provided during daily visits:Supportive, Cognitive Behavioral Therapy, and Insight Oriented Psychotherapy  Group and milieu therapies: daily per unit's schedule.  Continue with Social Work intervention provided for discharge planning including assistance with placement and follow-up psychiatric care.    Plan for Family Involvement: None Available  Other Providers Contact Information and Dates Contacted: No Updates    I personally saw and examined the patient and spent 20 minutes.     Details of Time Spent:   Direct Patient Contact: Conducted psychiatric exam, assessment, and evaluation.   Coordinating Care: Coordinated care plans with outpatient providers to ensure continuity of care.   Counseling: Provided counseling to the patient regarding treatment options, management of symptoms, and lifestyle modifications.   Obtaining Collateral Information: From Family- Discussed patients' current condition, progress, and care plan with family members to gather additional insights and support structures.   From Outpatient providers - Contacted outpatient healthcare providers to obtain comprehensive treatment history and previous intervention outcomes.       Signed by: Eliot Ford, NP  11/02/2022  10:37 AM

## 2022-11-02 NOTE — Plan of Care (Signed)
Problem: IP Suicidal Ideation  Description: Kin endorses suicidal ideation with plan to jump off bridge  Goal: LTG: Harlis will exhibit compliance with therapy  Outcome: Progressing:  Holdan exhibited compliance with therapy during this Day Shift.   Goal: STG: Kali will not engage in self-injurious activities  Outcome: Progressing:  Ivaan has not engaged in self-injurious activities during this Day Shift.     Problem: Addiction to alcohol or opioids or other substances AS EVIDENCED BY:  Description: Damyon endorses daily alcohol use  Goal: Patient achieves a safe detoxification and management of withdrawal symptoms  Description: Interventions:  1. Assess withdrawal signs/symptoms according to identified protocol (e.g. CIWA/COWS)  2. Provide medication teaching including name, dosage, benefits, action, effect and side effects  3. Assess effectiveness (relief of withdrawal symptoms) and side effects of medication  4. Educate about health risks associated with withdrawal (e.g. seizures, DTs)  5. Ensure laboratory results are reviewed with the LIP to increase understanding of the medical consequences of addiction  6. Ensure adequate hydration and nutrition during withdrawal  7. Educate about the importance of reporting dizziness or unsteadiness while ambulating to care providers (e.g. RN, LIP)  Outcome: Completed  Goal: Patient educated about the disease of addiction and recovery and identifies long-term goal  Description: Interventions:  1. Educate about the characteristics of alcohol/substance abuse/dependency  2. Assist patient to identify coping strategies to use as alternatives to alcohol/substance abuse/dependency  3. Encourage and monitor participation in all unit activities  4. Encourage participation in groups and 12 step meetings while on the unit  5. Provide patient with a task and an opportunity to present during group  Outcome: Completed  Goal: Patient develops a discharge plan  Description:  Interventions:  1. Review healthy diversion activities that promote abstinence from alcohol/substances  2. Review community resources and social supports that promote sober living  3. Review sober recovery transitional plan  4. Provide referral resources and assist in determining appropriate continuing care  5. Ensure follow up appointments for continued care are made  Outcome: Completed    Upon entering his room this morning, Edilberto was awake lying in bed.  He was receptive to greeting from [name].  He presented with a flat affect.  He maintained good eye contact during Daily Shift Assessment.  He is A+O to Person, Place, and Time.  He stated his name and DOB prior to receiving his scheduled AM medications, however, he chose not to take Campral 666 MG PO because he wanted to talk to Provider first.  Jonny Ruiz expressed concern that the antidepressant is the reason for his constipation.  He denied HI/AVH or urges to harm himself.  He has maintained safe behavior despite ongoing thoughts of wanting to end his life.  Zayde does not have a plan.  When asked if there have been any changes since yesterday, he reported sleeping only 1 or 2 hours last night.  He admitted not wanting to take anything to help him sleep "because I wanted to see if I could do it on my own like I did the night before."  RN offered PRN Vistaril 50 MG PO for anxiety that he rated 7/10, but, he declined stating, "I'll let you know."  He has remained in his room this morning where he was observed resting.  He has been pleasant when approached.  He made his needs known politely.  He has not attended to his ADL's at this time.  Jedadiah has maintained safety.  RN will monitor for  continued safe behavior.    "I, Rayvion Stumph, hereby attest that I have been made aware of and reviewed the following for this patient:    - Biopsychosocial assessment;  - Diagnoses;  - Nursing needs and medical support, if applicable; and  - Treatment Plan Goals, Objectives and  Strategies/Interventions.    I am involved in the care of this patient within my scope of practice and assigned tasks. I approve the treatment plan and this attestation is in lieu of a signature on the document."

## 2022-11-03 NOTE — Progress Notes (Signed)
Group Participation: Pt attended 0/5 groups.      Goals Group: No     Psych-Ed Group: No     Group Therapy: No     Pharmacy Group: No     Creative Therapy: No     Pt has had a low participation level on the unit throughout the shift. Pt was isolated to room throughout shift. The counselor encouraged the patient to attend the on-unit groups for support and structure. Patient was provided with rationale for groups and was encouraged to attend when the patient feels physically able to do so, in order to increase positive coping skills, and express feelings regarding hospitalization. Therapist will continue to reinforce pro-social behaviors, appropriate expression of feelings, and use of/efforts to use coping skills. The therapist will continue to assess and provide any support needed throughout the shift.

## 2022-11-03 NOTE — Plan of Care (Signed)
Gary [name] was A+Ox4 with stable affect and normal speech. He currently denies SI/HI/AVH/SIB urges. BVC is 0. Pt committed to maintaining his safety, encouraged to come to staff if thoughts or urges to harm self or others increases, Gary [name] verbalizes understanding. Pt is pleasant upon approach, calm and cooperative with care, able to answer questions and maintain good eye contact. Pt describes his mood as "good today." He reports of adequate sleep/energy/appetite. He rated his depression as 5/10 and anxiety as 7/10 (with 10 being the highest possible value). Gary [name] states his goal for today is "try to attend some groups." Gary [name] was compliant with medication administration and mouth check completed. Gary [name] was isolative to self in his room mostly this shift, briefly came out to make his needs known. Encouraged him to participate in groups, and socialize with peers and staff appropriately. Q15 min safety checks maintained. Will continue to monitor and ensure safety.  PRNs? Why? Vistaril at 1257 for anxiety.    Problem: IP Suicidal Ideation  Description: [name] endorses suicidal ideation with plan to jump off bridge  Goal: LTG: Gary [name] will exhibit compliance with therapy  Outcome: Progressing  Pt reports he attended groups yesterday and will attend group in this evening.  Goal: STG: Gary [name] will not engage in self-injurious activities  Outcome: Progressing  Pt denies SI and SIB urges.  Goal: STG: [name] will identify 3 internal coping strategies  Outcome: Progressing  Gary [name] is working on Geologist, engineering coping strategies.

## 2022-11-03 NOTE — Progress Notes (Signed)
Psychiatry Progress Note  (Level 1 = Problem Focused, Level 2 = Expanded Problem Focused, Level 3 = Detailed)    Patient Name: Gary [name].            Current Date/Time:  7/17/202412:43 PM  MRN:  [id number]                            Attending Physician: Farris Has, MD  DOB: 1980-12-31                                Gender: male                              I. History   Informants: Patient, medical record, treatment team   A. Chief Complaint or Reason for Admission       Admitted for worsening depression and suicidal ideation.    B.History of Present Illness: Interval History     (Symptoms and qualifiers:1 for Level 1, 2 for Level 2 and 3+ for Level 3)  Patient is a 41 y.o. divorced, un-domiciled male with history of depression, anxiety, and alcohol use disorder admitted for worsening depression and suicidal ideation with a plan to jump off a bridge. Patient was evaluated by the CSB and is currently admitted voluntarily.     November 03, 2022  Patient reports minimal/mild improvement in his mood though continues to endorse passive thoughts of death with hopelessness about his unstable housing and employment prospects. He has remained mostly isolative to his room but he has started to selectively attend groups. He is tolerating his medications well thus far and is agreeable to further increasing mirtazapine tonight. His constipation is resolving with the addition of miralax. He continues to endorse alcohol cravings with acamprosate but he is amenable to continuing the medication at this time. He denies active SI/HI/AVH and he is interested in receiving a referral for sober living options upon discharge.     C.Medical History  Review of Systems  (Level 1 is none, Level 2 is 1, and Level 3 is 2+)  A complete 14 point ROS was done and was negative except for  Psychiatric: Depression, Anxiety, and Suicidal    D.Additional Past Psychiatric, Substance Use, Medical, Family and Social History  Psychiatric: No new  information available as compared to previous encounter(s)  Substance Use: No new information available as compared to previous encounter(s)  Medical: No new information available as compared to previous encounter(s)  Family: No new information available as compared to previous encounter(s)  Social: No new information available as compared to previous encounter(s)    Medications:   Current Medications       Scheduled       Medication Ordered Dose/Rate, Route, Frequency Last Action    acamprosate (CAMPRAL) tablet 666 mg    666 mg, PO, TID Given, 666 mg at 07/17 0938    folic acid (FOLVITE) tablet 1 mg    1 mg, PO, Daily Given, 1 mg at 07/17 0939    mirtazapine (REMERON) tablet 45 mg    45 mg, PO, QHS Ordered    pantoprazole (PROTONIX) EC tablet 40 mg    40 mg, PO, QAM AC Given, 40 mg at 07/17 0800    polyethylene glycol (MIRALAX) packet 17 g    17 g, PO, Daily Given,  17 g at 07/17 0940    thiamine (VITAMIN B1) tablet 100 mg    100 mg, PO, Daily Given, 100 mg at 07/17 0939    vitamins/minerals tablet 1 tablet    1 tablet, PO, Daily Given, 1 tablet at 07/17 0939              PRN       Medication Ordered Dose/Rate, Route, Frequency Last Action    acetaminophen (TYLENOL) tablet 650 mg    650 mg, PO, Q6H PRN Ordered    alum & mag hydroxide-simethicone (MAALOX PLUS) 200-200-20 mg/5 mL suspension 30 mL    30 mL, PO, Q6H PRN Ordered    diphenhydrAMINE (BENADRYL) capsule 50 mg (Or Linked Group #1)    50 mg, PO, Once PRN Ordered    diphenhydrAMINE (BENADRYL) injection 50 mg (Or Linked Group #1)    50 mg, IM, Once PRN Ordered    hydrOXYzine (VISTARIL) capsule 50 mg    50 mg, PO, Q4H PRN Given, 50 mg at 07/15 2140    ibuprofen (ADVIL) tablet 800 mg    800 mg, PO, Q6H PRN Given, 800 mg at 07/16 1505    OLANZapine (ZyPREXA) tablet 10 mg    10 mg, PO, BID PRN Ordered    QUEtiapine (SEROquel) tablet 25 mg    25 mg, PO, QHS PRN Ordered                    II. Examination   Vital signs reviewed:   Blood pressure 101/60, pulse 65,  temperature 97.7 F (36.5 C), temperature source Oral, resp. rate 18, height 1.702 m (5\' 7" ), weight 68 kg (150 lb), SpO2 96%.     Mental Status Exam  (Level 1 is 1-5, Level 2 is 6-8, Level 3 is 9+)  General appearance: Appears chronological age, Poor hygiene, and Poor grooming  Attitude/Behavior: Calm, Cooperative, and Eye Contact is  Good  Motor: No abnormalities noted  Gait: Not observed  Muscle strength and tone: Not tested  Speech:   Spontaneous: Yes  Rate and Rhythm: Normal  Volume: Normal  Tone: Normal  Clarity: Yes  Mood: "alright"  Affect:   Range: Constricted  Stability: Stable  Appropriateness to thought content: Yes  Intensity: Dull  Mood-congruent  Thought Process:   Coherent: Yes  Logical:  Yes at times  Associations: Goal-directed  Thought Content:   Delusions:  Not elicited  Depressive Cognitions:  Hopeless, Helpless, Worthless, and Guilt  Suicidal:  Suicidal thoughts  Homicidal:  No homicidal thoughts  Violent Thoughts:  No  +Passive SI, no intent or plan  Perceptions:   Hallucinations: No  Insight: Fair  Judgment: Poor  Cognition:   Level of Consciousness: Intact  Orientation: Intact to self, place and time    Psychiatric / Cognitive Instruments: None    Pertinent Physical Exam: Not relevant to current chief complaint/reason for admission    Imaging / EKG / Labs: Labs in the last 24 hours  Results       ** No results found for the last 24 hours. **          EKG Results   Cardiology Results       Procedure Component Value Units Date/Time    ECG 12 lead (Electrocardiogram) [[id number]] Collected: 10/28/22 1740     Updated: 10/28/22 1753     Ventricular Rate 103 BPM      Atrial Rate 103 BPM      P-R Interval 148  ms      QRS Duration 98 ms      Q-T Interval 334 ms      QTC Calculation (Bezet) 437 ms      P Axis 47 degrees      R Axis 49 degrees      T Axis 37 degrees      IHS MUSE NARRATIVE AND IMPRESSION --     SINUS TACHYCARDIA  OTHERWISE NORMAL ECG  NO PREVIOUS ECGS AVAILABLE  Confirmed by Neomia Dear  MD, Whitley ([id number]) on 10/28/2022 5:53:08 PM      Narrative:      SINUS TACHYCARDIA  OTHERWISE NORMAL ECG  NO PREVIOUS ECGS AVAILABLE  Confirmed by Neomia Dear MD, Jaymian ([id number]) on 10/28/2022 5:53:08 PM          Brain Scan No results found for any visits on 10/28/22.    III. Assessment and Plan (Medical Decision Making)     1. I certify that this patient continues to require inpatient hospitalization due to acute risk to self and unable to care for self in the community with insufficient support available    2. Psychiatric Diagnoses  Depression, unspecified  Rule out MDD vs substance-induced mood disorder  Social Anxiety Disorder  Alcohol Use Disorder  Rule out PTSD    3. Additional Diagnoses          4.All diagnostic procedures completed since admission were reviewed. Past medical records reviewed. Coordination of care was discussed with inpatient team and as available with the outpatient team.    5. On this admission patient educated about and provided input into their treatment plan.  Patient understands potential risks and benefits of proposed treatment plan.     6.  Assessment / Impression  Patient is a 42 y.o. divorced, un-domiciled male with history of depression, anxiety, and alcohol use disorder admitted for worsening depression and suicidal ideation with a plan to jump off a bridge. Patient was evaluated by the CSB and is currently admitted voluntarily.     Patient reports mild improvement in mood but otherwise continues to endorse passive suicidal ideation/thoughts of death and alcohol cravings. His constipation is improving with miralax. He is agreeable to further increasing mirtazapine to 45 mg tonight.    At this time, the patient would continue to benefit from inpatient psychiatric hospitalization for safety, stabilization, diagnostic clarification, medication management, and discharge planning.    Suicide Risk Assessment   Suicide Risk Assessment performed? Yes: Suicide Thoughts / Behaviors: Yes  Current Plan:  None  Access to firearm: No    Plan / Recommendations:    Biological Plan:  Medications:   Increase mirtazapine from 30 to 45 mg qhs on 7/17  Acamprosate 666 mg TID  Scheduled Meds:  Current Facility-Administered Medications   Medication Dose Route Frequency    acamprosate  666 mg Oral TID    folic acid  1 mg Oral Daily    mirtazapine  45 mg Oral QHS    pantoprazole  40 mg Oral QAM AC    polyethylene glycol  17 g Oral Daily    thiamine  100 mg Oral Daily    vitamins/minerals  1 tablet Oral Daily     Continuous Infusions:  PRN Meds:.acetaminophen, alum & mag hydroxide-simethicone, diphenhydrAMINE **OR** diphenhydrAMINE, hydrOXYzine, ibuprofen, OLANZapine, QUEtiapine    Medical Work-up:  as per unit FNP  Consults:  as per unit FNP    Psychosocial Plan:  Individual therapy: Psycho-education and psychotherapy of the following modalities will continue to  be provided during daily visits:Supportive, Cognitive Behavioral Therapy, and Insight Oriented Psychotherapy  Group and milieu therapies: daily per unit's schedule.  Continue with Social Work intervention provided for discharge planning including assistance with placement and follow-up psychiatric care.    Plan for Family Involvement: None Available  Other Providers Contact Information and Dates Contacted: No Updates    I personally saw and examined the patient and spent 25 minutes on this visit reviewing previous notes, counseling the patient and/or family members regarding the patient's condition(s), ordering of tests, managing medications, and documenting the findings in the note.   Patient was also seen by Dr. Merlinda Frederick. The assessment and plan were discussed and formulated together and has been reviewed and updated as needed.      Signed by: Eldridge Dace, NP  11/03/2022  12:43 PM

## 2022-11-03 NOTE — Progress Notes (Signed)
During the treatment team meeting, it was reported that that the Pt might be exaggerating symptoms to prolong his stay. Currently, his doctor is adjusting his medications. The Pt housing instability is causing him a lot of anxiety.     The SW will provide the Pt with information on [address] and [employer]. The Pt has also expressed a need for clothes. The Pt wish to seek employment upon discharge.     Tyna Jaksch, MSW

## 2022-11-04 MED ORDER — GABAPENTIN 100 MG PO CAPS
100.0000 mg | ORAL_CAPSULE | Freq: Three times a day (TID) | ORAL | Status: DC
Start: 2022-11-04 — End: 2022-11-05
  Administered 2022-11-04 – 2022-11-05 (×3): 100 mg via ORAL
  Filled 2022-11-04 (×4): qty 1

## 2022-11-04 NOTE — UM Notes (Signed)
 [facility]  [address]  [place], [place] [address]  NPI: [id number]  Tax ID: [id number]        Gary [name].   DOB: Mar 11, 1981  [place]  [employer]  ID: [id number]         Attending:  Dr. Ozell Crimes, MD / NPI: [id number]     Inpatient CSR for 10/28/2022     Legal Status: Voluntary     7/17 Per chart note of NP Velma Blake:   Patient reports minimal/mild improvement in his mood though continues to endorse passive thoughts of death with hopelessness about his unstable housing and employment prospects. He has remained mostly isolative to his room but he has started to selectively attend groups. He is tolerating his medications well thus far and is agreeable to further increasing mirtazapine  tonight. His constipation is resolving with the addition of miralax . He continues to endorse alcohol  cravings with acamprosate  but he is amenable to continuing the medication at this time. He denies active SI/HI/AVH and he is interested in receiving a referral for sober living options upon discharge.     7/17 Per SW/[place] Planner:  The SW will provide the Pt with information on [address] and [employer].     7/17 Per BH Therapist note:  Pt attended 0/5 groups.   Pt has had a low participation level on the unit throughout the shift. Pt was isolated to room throughout shift.     7/17 Per Nursing note:  He rated his depression as 5/10 and anxiety as 7/10 (with 10 being the highest possible value).      Vistaril  at 1257 for anxiety.     7/17 VS:  T 97.7, HR 65, RR 18, bp 101/60, Pox 96%    7/17 Psychiatric Diagnoses per NP Velma Blake:   Depression, unspecified  Rule out MDD vs substance-induced mood disorder  Social Anxiety Disorder  Alcohol  Use Disorder  Rule out PTSD     7/17 Mental Status Exam per NP Velma Blake:   General appearance: Appears chronological age, Poor hygiene, and Poor grooming  Attitude/Behavior: Calm, Cooperative, and Eye Contact is  Good  Motor: No abnormalities  noted  Gait: Not observed  Muscle strength and tone: Not tested  Speech:  Spontaneous: Yes  Rate and Rhythm: Normal  Volume: Normal  Tone: Normal  Clarity: Yes  Mood: alright  Affect:  Range: Constricted  Stability: Stable  Appropriateness to thought content: Yes  Intensity: Dull  Mood-congruent  Thought Process:  Coherent: Yes  Logical:  Yes at times  Associations: Goal-directed  Thought Content:  Delusions:  Not elicited  Depressive Cognitions:  Hopeless, Helpless, Worthless, and Guilt  Suicidal:  Suicidal thoughts  Homicidal:  No homicidal thoughts  Violent Thoughts:  No  +Passive SI, no intent or plan  Perceptions:  Hallucinations: No  Insight: Fair  Judgment: Poor  Cognition:  Level of Consciousness: Intact  Orientation: Intact to self, place and time    7/17 Assessment / Plan per NP Velma Blake:   Patient reports mild improvement in mood but otherwise continues to endorse passive suicidal ideation/thoughts of death and alcohol  cravings. His constipation is improving with miralax . He is agreeable to further increasing mirtazapine  to 45 mg tonight.     At this time, the patient would continue to benefit from inpatient psychiatric hospitalization for safety, stabilization, diagnostic clarification, medication management, and discharge planning.  Biological Plan:  Medications:   Increase mirtazapine  from 30 to 45 mg  qhs on 7/17  Acamprosate  666 mg TID      7/18 Presenting Mental Status per Nursing:  Thought Content: SI  Thought Process: Linear and goal-directed   Mood: Depressed;Anxious   Affect: Blunted   Attitude: Cooperative;Guarded   Behavior: Calm   Speech: WNL   Eye Contact: WNL   Appearance: Appropriatly dressed   Insight: Partial   Judgment: Impaired   Impulse Control: Intact   Concentration: Intact   Energy: Decreased   Appetite: WNL  Orientation Level: Return to [facility]     7/17-7/18 Scheduled and PRN Meds:  Campral  666mg  po tid  Remeron  30mg  po qHS-->45mg  po qHS (7/17)  Vistaril  50mg  po q4hr prn (rec'd 2  doses on 7/17)  Seroquel  50mg  po qHS prn-->25mg  po qHS prn (7/17) (rec'd 1 dose on 7/17)     7/17-7/18 Treatment plan:  Activity as tolerated  Suicide precautions  Close monitoring q checks        CIWA  VS bid   Medication management and stabilization  Group/individual therapy  Therapeutic Recreation eval and treat  D/c planning                        Gary [name], BSN, RN, [employer], ACM-RN          Utilization Review   [facility]  [address]  [address]  [place], [place] [address]  Phone: [phone] (VM)  [place]: [phone]  Fax: [phone]

## 2022-11-04 NOTE — Progress Notes (Addendum)
 During the treatment team meeting, it was reported that the Pt has been mostly cooperative with treatment, but he has not been attending all group sessions citing social anxiety. He spends most of his time isolated in his room. Additionally, he informed the doctor that he is not interested in rehab.     The SW will connect Pt with a list of [facility] in the are to begin the application process, if interested. Once he is accepted, SW will assist him in completing a scholarship application to help cover the initial fees until he finds employment.        SW provide Pt with information on [address] and a list of [place] house vacancies in [place], [place], and [place] to contact. SW encouraged Pt to call and schedule an interview.     The SW checked in on the Pt. The Pt stated he is doing well. He mentioned his plan to contact [facility] and will follow up with the SW afterward. The Pt also mentioned he has been affiliated with [facility] before in [place] and [place]. During the discussion, the Pt and SW talked about the process of applying for a scholarship to help cover his [employer] fee until he can secure a job. He also stated he contacted the other list of shelters provided to him but he ran into some issues regarding residency.     [place], MSW

## 2022-11-04 NOTE — Progress Notes (Signed)
 Psychiatry Progress Note  (Level 1 = Problem Focused, Level 2 = Expanded Problem Focused, Level 3 = Detailed)    Patient Name: Gary [name].            Current Date/Time:  7/18/20242:59 PM  MRN:  [id number]                            Attending Physician: Marikay Ozell CROME, MD  DOB: Jan 03, 1981                                Gender: male                              I. History   Informants: Patient, medical record, treatment team   A. Chief Complaint or Reason for Admission       Admitted for worsening depression and suicidal ideation.    B.History of Present Illness: Interval History     (Symptoms and qualifiers:1 for Level 1, 2 for Level 2 and 3+ for Level 3)  Patient is a 42 y.o. divorced, un-domiciled male with history of depression, anxiety, and alcohol  use disorder admitted for worsening depression and suicidal ideation with a plan to jump off a bridge. Patient was evaluated by the [facility] and is currently admitted voluntarily.     November 04, 2022  Patient shares that his mood is improving gradually and his suicidal thoughts are less frequent, though still present. He is tolerating mirtazapine  well and he reports adequate sleep and appetite. He continues to endorse anxiety and alcohol  cravings. He reports that alcohol  has been his primary coping skill for years and he is hesitant about his ability to maintain his sobriety. He has been looking into [facility] and shelters and he plans on calling some facilities while admitted.     Patient reports that he has tried gabapentin  in the past at doses up to 300 mg TID. He reported minimal efficacy but denied adverse effects. He is amenable to a re-trial of the medication while admitted.     C.Medical History  Review of Systems  (Level 1 is none, Level 2 is 1, and Level 3 is 2+)  A complete 14 point ROS was done and was negative except for  Psychiatric: Depression, Anxiety, and Suicidal    D.Additional Past Psychiatric, Substance Use, Medical, Family and Social  History  Psychiatric: No new information available as compared to previous encounter(s)  Substance Use: No new information available as compared to previous encounter(s)  Medical: No new information available as compared to previous encounter(s)  Family: No new information available as compared to previous encounter(s)  Social: No new information available as compared to previous encounter(s)    Medications:   Current Medications       Scheduled       Medication Ordered Dose/Rate, Route, Frequency Last Action    acamprosate  (CAMPRAL ) tablet 666 mg    666 mg, PO, TID Given, 666 mg at 07/18 1358    folic acid  (FOLVITE ) tablet 1 mg    1 mg, PO, Daily Given, 1 mg at 07/18 0904    gabapentin  (NEURONTIN ) capsule 100 mg    100 mg, PO, Q8H SCH Ordered    mirtazapine  (REMERON ) tablet 45 mg    45 mg, PO, QHS Given, 45 mg at 07/17 2128  pantoprazole  (PROTONIX ) EC tablet 40 mg    40 mg, PO, QAM AC Given, 40 mg at 07/18 0830    polyethylene glycol (MIRALAX ) packet 17 g    17 g, PO, Daily Given, 17 g at 07/18 0904    thiamine  (VITAMIN B1) tablet 100 mg    100 mg, PO, Daily Given, 100 mg at 07/18 0904    vitamins/minerals tablet 1 tablet    1 tablet, PO, Daily Given, 1 tablet at 07/18 0904              PRN       Medication Ordered Dose/Rate, Route, Frequency Last Action    acetaminophen  (TYLENOL ) tablet 650 mg    650 mg, PO, Q6H PRN Ordered    alum & mag hydroxide-simethicone  (MAALOX PLUS) 200-200-20 mg/5 mL suspension 30 mL    30 mL, PO, Q6H PRN Ordered    diphenhydrAMINE  (BENADRYL ) capsule 50 mg (Or Linked Group #1)    50 mg, PO, Once PRN Ordered    diphenhydrAMINE  (BENADRYL ) injection 50 mg (Or Linked Group #1)    50 mg, IM, Once PRN Ordered    hydrOXYzine  (VISTARIL ) capsule 50 mg    50 mg, PO, Q4H PRN Given, 50 mg at 07/17 2128    ibuprofen  (ADVIL ) tablet 800 mg    800 mg, PO, Q6H PRN Given, 800 mg at 07/17 2126    OLANZapine  (ZyPREXA ) tablet 10 mg    10 mg, PO, BID PRN Ordered    QUEtiapine  (SEROquel ) tablet 25 mg    25 mg,  PO, QHS PRN Given, 25 mg at 07/17 2129                    II. Examination   Vital signs reviewed:   Blood pressure 99/57, pulse (!) 59, temperature 97.5 F (36.4 C), temperature source Oral, resp. rate 16, height 1.702 m (5' 7), weight 68 kg (150 lb), SpO2 97%.     Mental Status Exam  (Level 1 is 1-5, Level 2 is 6-8, Level 3 is 9+)  General appearance: Appears chronological age, Poor hygiene, and Poor grooming  Attitude/Behavior: Calm, Cooperative, and Eye Contact is  Good  Motor: No abnormalities noted  Gait: Not observed  Muscle strength and tone: Not tested  Speech:   Spontaneous: Yes  Rate and Rhythm: Normal  Volume: Normal  Tone: Normal  Clarity: Yes  Mood: Okay I guess  Affect:   Range: Constricted  Stability: Stable  Appropriateness to thought content: Yes  Intensity: Dull  Mood-congruent  Thought Process:   Coherent: Yes  Logical:  Yes at times  Associations: Goal-directed  Thought Content:   Delusions:  Not elicited  Depressive Cognitions:  Hopeless, Helpless, Worthless, and Guilt  Suicidal:  Suicidal thoughts  Homicidal:  No homicidal thoughts  Violent Thoughts:  No  +Passive SI, no intent or plan  Perceptions:   Hallucinations: No  Insight: Fair  Judgment: Poor  Cognition:   Level of Consciousness: Intact  Orientation: Intact to self, place and time    Psychiatric / Cognitive Instruments: None    Pertinent Physical Exam: Not relevant to current chief complaint/reason for admission    Imaging / EKG / Labs: Labs in the last 24 hours  Results       ** No results found for the last 24 hours. **          EKG Results   Cardiology Results       Procedure Component Value  Units Date/Time    ECG 12 lead (Electrocardiogram) [[id number]] Collected: 10/28/22 1740     Updated: 10/28/22 1753     Ventricular Rate 103 BPM      Atrial Rate 103 BPM      P-R Interval 148 ms      QRS Duration 98 ms      Q-T Interval 334 ms      QTC Calculation (Bezet) 437 ms      P Axis 47 degrees      R Axis 49 degrees      T Axis 37  degrees      IHS MUSE NARRATIVE AND IMPRESSION --     SINUS TACHYCARDIA  OTHERWISE NORMAL ECG  NO PREVIOUS ECGS AVAILABLE  Confirmed by LORENE MD, [place] ([id number]) on 10/28/2022 5:53:08 PM      Narrative:      SINUS TACHYCARDIA  OTHERWISE NORMAL ECG  NO PREVIOUS ECGS AVAILABLE  Confirmed by LORENE MD, [place] ([id number]) on 10/28/2022 5:53:08 PM          Brain Scan No results found for any visits on 10/28/22.    III. Assessment and Plan (Medical Decision Making)     1. I certify that this patient continues to require inpatient hospitalization due to acute risk to self and unable to care for self in the community with insufficient support available    2. Psychiatric Diagnoses  Depression, unspecified  Rule out MDD vs substance-induced mood disorder  Social Anxiety Disorder  Alcohol  Use Disorder  Rule out PTSD    3. Additional Diagnoses          4.All diagnostic procedures completed since admission were reviewed. Past medical records reviewed. Coordination of care was discussed with inpatient team and as available with the outpatient team.    5. On this admission patient educated about and provided input into their treatment plan.  Patient understands potential risks and benefits of proposed treatment plan.     6.  Assessment / Impression  Patient is a 42 y.o. divorced, un-domiciled male with history of depression, anxiety, and alcohol  use disorder admitted for worsening depression and suicidal ideation with a plan to jump off a bridge. Patient was evaluated by the [facility] and is currently admitted voluntarily.     Patient reports that his suicidality is lessening in intensity but he continues to endorse hopelessness and passive thoughts of death. He reports adequate sleep and appetite. He continues to endorse high anxiety and alcohol  cravings and reports minimal benefit from acamprosate  thus far. Will offer gabapentin  300 mg TID for anxiety/social anxiety and alcohol  use disorder and will continue remaining medications.    At this time,  the patient would continue to benefit from inpatient psychiatric hospitalization for safety, stabilization, diagnostic clarification, medication management, and discharge planning. Patient is planning on calling sober living facilities vs. Shelters tonight.     Suicide Risk Assessment   Suicide Risk Assessment performed? Yes: Suicide Thoughts / Behaviors: Yes  Current Plan: None  Access to firearm: No    Plan / Recommendations:    Biological Plan:  Medications:   Mirtazapine  45 mg qhs  Acamprosate  666 mg TID  Start gabapentin  300 mg TID  Scheduled Meds:  Current Facility-Administered Medications   Medication Dose Route Frequency    acamprosate   666 mg Oral TID    folic acid   1 mg Oral Daily    gabapentin   100 mg Oral Q8H SCH    mirtazapine   45 mg Oral QHS  pantoprazole   40 mg Oral QAM AC    polyethylene glycol  17 g Oral Daily    thiamine   100 mg Oral Daily    vitamins/minerals  1 tablet Oral Daily     Continuous Infusions:  PRN Meds:.acetaminophen , alum & mag hydroxide-simethicone , diphenhydrAMINE  **OR** diphenhydrAMINE , hydrOXYzine , ibuprofen , OLANZapine , QUEtiapine     Medical Work-up:  as per unit FNP  Consults:  as per unit FNP    Psychosocial Plan:  Individual therapy: Psycho-education and psychotherapy of the following modalities will continue to be provided during daily visits:Supportive, Cognitive Behavioral Therapy, and Insight Oriented Psychotherapy  Group and milieu therapies: daily per unit's schedule.  Continue with Social Work intervention provided for discharge planning including assistance with placement and follow-up psychiatric care.    Plan for Family Involvement: None Available  Other Providers Contact Information and Dates Contacted: No Updates    I personally saw and examined the patient and spent 40 minutes on this visit reviewing previous notes, counseling the patient and/or family members regarding the patient's condition(s), ordering of tests, managing medications, and documenting the  findings in the note.   Patient was also seen by Dr. Marikay. The assessment and plan were discussed and formulated together and has been reviewed and updated as needed.      Signed by: Velma Blake, NP  11/04/2022  2:59 PM

## 2022-11-04 NOTE — Plan of Care (Signed)
 Problem: IP Suicidal Ideation  Description: Gary [name] endorses suicidal ideation with plan to jump off bridge  Goal: LTG: Gary [name] will exhibit compliance with therapy  Outcome: Progressing  Gary [name] is  compliant with  medication  did not attend   groups  Goal: STG: Gary [name] will not engage in self-injurious activities  Outcome: Progressing  Gary [name]  did  not engaged ibn self injurious behavior  Goal: STG: to get help  Outcome: Progressing  Gary [name]    report  trying to seek  help.Gary [name] was A+Ox4 with  blunted  affect and  clear speech. Denies/HI/AVH/SIB urges.  Endorsed passive SI BVC is 0. Pt states  he  is  committed to maintaining their safety, encouraged to come to staff if thoughts or urges to harm self or others increases, Gary [name] verbalizes understanding. Pt is  calm  and cooperative with Clinical research associate, able to answer questions and maintain  good  eye contact. Pt describes their mood as  good.'' Report  okay '' sleep/ good  energy fair appetite.  Gary [name] rated their depression as  4/10 and anxiety as 3/10 (with 10 being the highest possible value). Gary [name] states their goal for today is  to  relax and try to go to groups , but did not attend groups. [name]  is  compliant with medication administration and mouth check completed. Gary [name] was  seen  in the milieu throughout the shift  Q15 min safety checks maintained. Will continue to monitor and ensure safety.        "I

## 2022-11-04 NOTE — Group Note (Signed)
BH - Art Therapy  Inpatient Behavioral Health Group Note    Patient Name:  Gary [name].       Medical Record Number: [id number]   Date of Birth: [birth year]  Sex: Male          Rehab Diagnosis: Suicidal ideation [R45.851];Alcohol dependence with uncomplicated intoxication [F10.220]     Today's Date: 11/04/2022   Group Start Time: 1500   Group End Time: 1600   Group Facilitators: Norma Fredrickson  Group Topic: BH - Art Therapy  This session was aimed to promote mindfulness, problem solving, frustration tolerance and hopes instillation through the art therapy intervention of "Hopes and healing paper crane".      Patient's response to treatment:    Participation : Partial  Appearance: Neat  Mood: Euthymic   Affect: Calm  Behavior: Cooperative  Thought Content: Others. Not able to assess.  Thought Process: Others. Not able to assess.  Speech: Normal    Mr. Werner Labella preferred to sit near to the entrance of the art studio instead of joining the table. Although the writer invited him a few times, he preferred to observe or starred into space at times. He left after staying for about 20 minutes.        Recommendation:  To continue art therapy engagement in order to promote  self- expression, stress relief, mindfulness, social skills, and establish positive coping skills for symptom management.      Signed by:   Vincenza Hews, Calyse Murcia Kerrie [place] , [place]  [facility]

## 2022-11-04 NOTE — Plan of Care (Addendum)
 Problem: Pain interferes with ability to perform ADL  Goal: Pain at adequate level as identified by patient  Description: Interventions:  1. Identify patient comfort function goal  2. Evaluate if patient comfort function goal is met  3. Assess pain on admission, during daily assessment and/or before any as needed intervention(s)  4. Reassess pain within 30-60 minutes of any procedure/intervention, per Pain Assessment, Intervention, Reassessment (AIR) Cycle  5. Evaluate patient's satisfaction with pain management progress  6. Offer non-pharmacological pain management interventions  7. Consult/collaborate with Pain Service  8. Consult/collaborate with Physical Therapy, Occupational Therapy, and/or Speech Therapy  9. Assess for risk of opioid induced respiratory depression and side effects, including snoring/sleep apnea. Alert healthcare team of risk factors identified.  10. Include patient/patient care companion in decisions related to pain management as needed  Outcome: Progressing  Gary [name] reports feeling pain to right side of abdomen rating 5/10 with 10 being the worst.  PRN Ibuprofen  given.       Problem: IP Suicidal Ideation  Description: Gary [name] endorses suicidal ideation with plan to jump off bridge  Goal: LTG: Gary [name] will exhibit compliance with therapy  Outcome: Progressing  Gary [name] compliant with medication administration, mouth check completed.  Gary [name] did not attend group therapy session.  Goal: STG: Gary [name] will not engage in self-injurious activities  Outcome: Progressing    No SIB observed or reported.     Gary [name] encouraged Gary [name] to attend at least 3 group sessions daily and verbalized understanding.  Gary [name] reports that he does not do well in groups.  Gary [name] listened with empathy, encouraged Gary [name] to attend groups and reminded him that he is able to leave the group if he feels the need.  Gary [name] verbalized understanding.  Coping skills discussed with Gary [name].  Gary [name] is able to make his needs known and encouraged to seek staff for assistance if  needed.  Gary [name] verbalized understanding.  Q15 min observations and RN hourly rounding maintained for Gary [name] safety, will continue to monitor for changes in mood and behavior.  Support provided as needed.      No acute events overnight.  Gary [name] observed resting quietly in bed with eyes closed respirations even and unlabored for 8 hours @0600 .

## 2022-11-04 NOTE — Progress Notes (Signed)
 Attendance Note:  Patient attended 1 of 5 groups today. Pls see group notes for detail.

## 2022-11-05 MED ORDER — HYDROXYZINE PAMOATE 25 MG PO CAPS
75.0000 mg | ORAL_CAPSULE | ORAL | Status: DC | PRN
Start: 2022-11-05 — End: 2022-11-11
  Administered 2022-11-05 – 2022-11-11 (×6): 75 mg via ORAL
  Filled 2022-11-05 (×5): qty 3

## 2022-11-05 MED ORDER — GABAPENTIN 100 MG PO CAPS
200.0000 mg | ORAL_CAPSULE | Freq: Three times a day (TID) | ORAL | Status: DC
Start: 2022-11-05 — End: 2022-11-06
  Administered 2022-11-05 – 2022-11-06 (×4): 200 mg via ORAL
  Filled 2022-11-05 (×3): qty 2

## 2022-11-05 NOTE — Plan of Care (Signed)
Problem: IP Suicidal Ideation  Description: Gary [name] endorses suicidal ideation with plan to jump off bridge  Goal: LTG: [name] will exhibit compliance with therapy.  Outcome: Not Progressing  Patient remains nonadherent to group participation.     Problem: IP Suicidal Ideation  Description: Gary [name] endorses suicidal ideation with plan to jump off bridge.  Goal: STG: Gary [name] will not engage in self-injurious activities.  Outcome: Progressing  Patient remained safe and free of self-injurious behavior throughout the shift.     Gary [name] was alert and oriented times four and presented with appropriate speech; a depressed and anxious mood; mood-congruent affect; and a cooperative, attitude during interactions with this Clinical research associate. On approach, Gary [name] could be witnessed lying in bed, reading a book, with no distress noted. Patient remained isolative to himself and room for the entirety of shift, but was able to make his needs known. During assessment, patient described his mood as "depressed," and continues to endorse severe depression and anxiety, passive suicidal ideations and pain. Patient was encouraged to report any worsening feelings of depression/anxiety, persistent thoughts of self harm/harm to others, and overall concerns to this Clinical research associate and staff; patient verbalized understanding. Gary [name] did deny feelings/thoughts of homicidal ideations and audiovisual hallucinations. Patient received PRN Ibuprofen 800 mg for abdominal pain, which had an adequate effect. Gary [name] was medication compliant and mouth check was conducted after administration. Patient reported no issues with appetite, sleep and toileting habits. Overall, no major behavioral issues to report.      Q15 minute checks and hourly RN rounding for patient safety maintained, and plan of care continues.      Patient was witnessed resting for approximately 7 hours as of 0600.     "I, Gary [name], hereby attest that I have been made aware of and reviewed the following for this  patient:    - Biopsychosocial assessment;  - Diagnoses;  - Nursing needs and medical support, if applicable; and  - Treatment Plan Goals, Objectives and Strategies/Interventions.    I am involved in the care of this patient within my scope of practice and assigned tasks. I approve the treatment plan and this attestation is in lieu of a signature on the document."

## 2022-11-05 NOTE — Group Note (Signed)
Group: BH - Creative  Group Topics: Creative Therapy  Group Date: 11/05/2022  Start Time: 0300  End Time: 0400  Number of Participants: 7  Facilitators: Erlinda Hong   Department: [facility]    Discipline Led: Behavioral Health Therapy  Number of Patient Participants: 7      Name: Gary [name]. Date of Birth: 30-Oct-1980   MR: [id number]      Level of Participation: arrived late  Quality of Participation: attentive, cooperative, and engaged  Interactions with Others: gave feedback  Mood/Affect: appropriate  Triggers (if applicable): NA  Cognition: logical  Organization: completed task  Progress: Minimal and Moderate  Response: Pt. Presents with appropriate affect and even mood. Pt. Arrived late to group, and declined to work on an Event organiser, but spoke with this therapist regarding his history of alcohol abuse, therapy, and social anxiety.  Plan: patient will be encouraged to continue attending and participating in group therapies.    Patients Problems:   Patient Active Problem List   Diagnosis    Suicidal ideation

## 2022-11-05 NOTE — Progress Notes (Addendum)
During the treatment team meeting, it was reported that the Pt is still experiencing frequent thoughts of suicide and is minimally engaging with peers. The treatment team will continue to support him.     The SW checked in on the Pt at bedside. The Pt stated he contacted [facility], and they instructed him to complete the application process online. Once accepted, he can apply for a scholarship to cover the first month's rent until he secures employment.     The SW requested an order from the Pt's doctor for the Pt to use his phone and forward his application to the SW to scan and send to [employer].     The SW spoke to an [address] worker ([address] [place], [email or web address]   [phone]) who instructed him to fax over the forms. The [employer] worker will then contact the SW later to complete the interview process.     Requested order was approved by the doctor.     SW contacted [employer] worker Maurine Cane who stated he is only in charge of the [place] and [employer] locations, and instructed SW  contact Debby Merilynn Finland [phone], who is in charge of the [place] team to finish completing the referral process since Pt in interested in residing in the [place] area.        Tyna Jaksch, MSW

## 2022-11-05 NOTE — Plan of Care (Signed)
Problem: Pain interferes with ability to perform ADL  Goal: Pain at adequate level as identified by patient  Description: Interventions:  1. Identify patient comfort function goal  2. Evaluate if patient comfort function goal is met  3. Assess pain on admission, during daily assessment and/or before any "as needed" intervention(s)  4. Reassess pain within 30-60 minutes of any procedure/intervention, per Pain Assessment, Intervention, Reassessment (AIR) Cycle  5. Evaluate patient's satisfaction with pain management progress  6. Offer non-pharmacological pain management interventions  7. Consult/collaborate with Pain Service  8. Consult/collaborate with Physical Therapy, Occupational Therapy, and/or Speech Therapy  9. Assess for risk of opioid induced respiratory depression and side effects, including snoring/sleep apnea. Alert healthcare team of risk factors identified.  10. Include patient/patient care companion in decisions related to pain management as needed  Outcome: Progressing  Bayler denies pain this shift     Problem: IP Suicidal Ideation  Description: Ines endorses suicidal ideation with plan to jump off bridge  Goal: LTG: Miroslav will exhibit compliance with therapy  Outcome: Progressing  Jaxsyn denies SI/HI/AVH  Goal: STG: Arhaan will not engage in self-injurious activities  Outcome: Progressing  Marcellius did not engage in self injurious activities.    Trae was A+Ox4 with blunted affect and WNL speech. Denies SI/HI/AVH/SIB urges. BVC is 0. Pt states they are committed to maintaining their safety, encouraged to come to staff if thoughts or urges to harm self or others increases, Cabell verbalizes understanding. Pt is cooperative with Clinical research associate, able to answer questions and maintain eye contact. Pt describes their mood as "Depressed". Reported good sleep/energy/appetite.  Henley rated their depression as 10/10 and anxiety as 7/10 (with 10 being the highest possible value). Sriansh states their goal for today is"See Arts development officer, Very important". Clarice is compliant with medication administration and mouth check completed. Elon was visible in the milieu throughout the shift,  participated in groups with peers and staff appropriately. Q15 min safety checks maintained. Will continue to monitor and ensure safety.        "I, Dan Europe, hereby attest that I have been made aware of and reviewed the following for this patient:    - Biopsychosocial assessment;  - Diagnoses;  - Nursing needs and medical support, if applicable; and  - Treatment Plan Goals, Objectives and Strategies/Interventions.    I am involved in the care of this patient within my scope of practice and assigned tasks. I approve the treatment plan and this attestation is in lieu of a signature on the document."

## 2022-11-05 NOTE — Progress Notes (Signed)
Psychiatry Progress Note  (Level 1 = Problem Focused, Level 2 = Expanded Problem Focused, Level 3 = Detailed)    Patient Name: Gary [name].            Current Date/Time:  7/19/20248:30 AM  MRN:  [id number]                            Attending Physician: Farris Has, MD  DOB: [birth year]                                Gender: male                              I. History   Informants: Patient, medical record, treatment team   A. Chief Complaint or Reason for Admission       Admitted for worsening depression and suicidal ideation.    B.History of Present Illness: Interval History     (Symptoms and qualifiers:1 for Level 1, 2 for Level 2 and 3+ for Level 3)  Patient is a 42 y.o. divorced, un-domiciled male with history of depression, anxiety, and alcohol use disorder admitted for worsening depression and suicidal ideation with a plan to jump off a bridge. Patient was evaluated by the [facility] and is currently admitted voluntarily.     November 05, 2022  Patient was rounded with  attending at lunch hour; pt was in his room.    Sleep was good  Mood "good"    Thoughts: "all kind of thoughts--the past "  Concerns: the loss of all possessions, loss of relationships with my kids,     Anxious thoughts:  "My daughter was  raped last year  at age 82-----I told myself she  was lying---she told me  later that she  was raped---I continued drinking--because I was not there to protect my daughter"    Frustrations about alcohol relapse:  - attending provided therapy and psychoeducation    Side effects from meds:  - denied    SI  - "I do not have a reason to live, I do not want to live"  - pt did not mention any plans to harm self on the unit    Stressors:  - relationship with kids  - relationships with  ex-wife    Attending provided supportive and cognitive therapy.    C.Medical History  Review of Systems  (Level 1 is none, Level 2 is 1, and Level 3 is 2+)  A complete 14 point ROS was done and was negative except for  Psychiatric:  Depression, Anxiety, and Suicidal    D.Additional Past Psychiatric, Substance Use, Medical, Family and Social History  Psychiatric: No new information available as compared to previous encounter(s)  Substance Use: No new information available as compared to previous encounter(s)  Medical: No new information available as compared to previous encounter(s)  Family: No new information available as compared to previous encounter(s)  Social: No new information available as compared to previous encounter(s)    Medications:   Current Medications       Scheduled       Medication Ordered Dose/Rate, Route, Frequency Last Action    acamprosate (CAMPRAL) tablet 666 mg    666 mg, PO, TID Given, 666 mg at 07/19 0859    folic acid (FOLVITE) tablet 1 mg  1 mg, PO, Daily Given, 1 mg at 07/19 0859    gabapentin (NEURONTIN) capsule 100 mg    100 mg, PO, Q8H SCH Given, 100 mg at 07/19 0859    mirtazapine (REMERON) tablet 45 mg    45 mg, PO, QHS Given, 45 mg at 07/18 2201    pantoprazole (PROTONIX) EC tablet 40 mg    40 mg, PO, QAM AC Given, 40 mg at 07/19 0800    polyethylene glycol (MIRALAX) packet 17 g    17 g, PO, Daily Given, 17 g at 07/19 0859    thiamine (VITAMIN B1) tablet 100 mg    100 mg, PO, Daily Given, 100 mg at 07/19 0859    vitamins/minerals tablet 1 tablet    1 tablet, PO, Daily Given, 1 tablet at 07/19 0859              PRN       Medication Ordered Dose/Rate, Route, Frequency Last Action    acetaminophen (TYLENOL) tablet 650 mg    650 mg, PO, Q6H PRN Ordered    alum & mag hydroxide-simethicone (MAALOX PLUS) 200-200-20 mg/5 mL suspension 30 mL    30 mL, PO, Q6H PRN Ordered    diphenhydrAMINE (BENADRYL) capsule 50 mg (Or Linked Group #1)    50 mg, PO, Once PRN Ordered    diphenhydrAMINE (BENADRYL) injection 50 mg (Or Linked Group #1)    50 mg, IM, Once PRN Ordered    hydrOXYzine (VISTARIL) capsule 50 mg    50 mg, PO, Q4H PRN Given, 50 mg at 07/17 2128    ibuprofen (ADVIL) tablet 800 mg    800 mg, PO, Q6H PRN Given, 800  mg at 07/18 2223    OLANZapine (ZyPREXA) tablet 10 mg    10 mg, PO, BID PRN Ordered    QUEtiapine (SEROquel) tablet 25 mg    25 mg, PO, QHS PRN Given, 25 mg at 07/17 2129                    II. Examination   Vital signs reviewed:   Blood pressure 101/60, pulse 77, temperature 97.7 F (36.5 C), temperature source Oral, resp. rate 17, height 1.702 m (5\' 7" ), weight 68 kg (150 lb), SpO2 100%.     Mental Status Exam  (Level 1 is 1-5, Level 2 is 6-8, Level 3 is 9+)  General appearance: Appears chronological age, Good hygiene, and Good grooming  Attitude/Behavior: Cooperative, Eye Contact is  Good, and slightly anxious  Motor: No abnormalities noted  Gait: No obvious abnormalities  Muscle strength and tone: Not tested  Speech:   Spontaneous: Yes  Rate and Rhythm: Normal  Volume: Normal  Tone: Normal  Clarity: Yes  Mood: "good"  Affect:   Range: Constricted  Stability: Stable  Appropriateness to thought content: Yes  Intensity: Normal  Thought Process:   Coherent: Yes  Logical:  Yes  Associations: Goal-directed  Thought Content:   Delusions:  Not elicited  Depressive Cognitions:  Hopeless, Helpless, Worthless, and Guilt  Suicidal:  Suicidal thoughts  Homicidal:  No homicidal thoughts  Violent Thoughts:  No  Perceptions:   Hallucinations: No  Insight: Fair  Judgment: Poor  Cognition:   Level of Consciousness: Intact  Orientation: Intact to self, place and time    Psychiatric / Cognitive Instruments: None    Pertinent Physical Exam: Not relevant to current chief complaint/reason for admission    Imaging / EKG / Labs: Labs in the last  24 hours  Results       ** No results found for the last 24 hours. **          EKG Results   Cardiology Results       Procedure Component Value Units Date/Time    ECG 12 lead (Electrocardiogram) [[id number]] Collected: 10/28/22 1740     Updated: 10/28/22 1753     Ventricular Rate 103 BPM      Atrial Rate 103 BPM      P-R Interval 148 ms      QRS Duration 98 ms      Q-T Interval 334 ms      QTC  Calculation (Bezet) 437 ms      P Axis 47 degrees      R Axis 49 degrees      T Axis 37 degrees      [facility] MUSE NARRATIVE AND IMPRESSION --     SINUS TACHYCARDIA  OTHERWISE NORMAL ECG  NO PREVIOUS ECGS AVAILABLE  Confirmed by Neomia Dear MD, Lennyn ([id number]) on 10/28/2022 5:53:08 PM      Narrative:      SINUS TACHYCARDIA  OTHERWISE NORMAL ECG  NO PREVIOUS ECGS AVAILABLE  Confirmed by Neomia Dear MD, Lateef ([id number]) on 10/28/2022 5:53:08 PM          Brain Scan No results found for any visits on 10/28/22.    III. Assessment and Plan (Medical Decision Making)     1. I certify that this patient continues to require inpatient hospitalization due to acute risk to self and unable to care for self in the community with insufficient support available    2. Psychiatric Diagnoses  Depression, unspecified  Rule out MDD vs substance-induced mood disorder  Social Anxiety Disorder  Alcohol Use Disorder  Rule out PTSD    3. Additional Diagnoses          4.All diagnostic procedures completed since admission were reviewed. Past medical records reviewed. Coordination of care was discussed with inpatient team and as available with the outpatient team.    5. On this admission patient educated about and provided input into their treatment plan.  Patient understands potential risks and benefits of proposed treatment plan.     6.  Assessment / Impression  Patient is a 42 y.o. divorced, un-domiciled male with history of depression, anxiety, and alcohol use disorder admitted for worsening depression and suicidal ideation with a plan to jump off a bridge. Patient was evaluated by the [facility] and is currently admitted voluntarily.     7/18: Patient reports that his suicidality is lessening in intensity but he continues to endorse hopelessness and passive thoughts of death. He reports adequate sleep and appetite. He continues to endorse high anxiety and alcohol cravings and reports minimal benefit from acamprosate thus far. Will offer gabapentin 100 mg TID for anxiety/social  anxiety and alcohol use disorder and will continue remaining medications.  --Patient is planning on calling sober living facilities vs. Shelters tonight.    7/19: Patient continues to express anxious and intrusive thoughts,hopelessness in the context of relationship with family members, and long-standing alcohol relapses. Pt was educated about if he has alcohol relapse in the future to make a goal for short relapse instead of perfectionism. Does not see any hope in the context of wanting to live. Agreeable to increase gabapentin for anxiety ; pt reported prn vistaril for anxiety he had taken it previously as high as 150mg  at another hospital for anxiety. Attending provided supportive and cognitive  therapy.  - Likely discharge plan including to [facility]  -At this time, the patient would continue to benefit from inpatient psychiatric hospitalization for safety, stabilization, diagnostic clarification, medication management, and discharge planning.     Suicide Risk Assessment  - "I do not have a reason to live, I do not want to live"  - passive SI  - No access to drug    Plan / Recommendations:    Biological Plan:  Medications:   Mirtazapine 45 mg qhs  Acamprosate 666 mg TID  Increase gabapentin 100 mg TID to 200mg  TID (first dose 7/18)  Increase PRN vistaril from 50mg  to 75mg  q4 hours for anxiety  Scheduled Meds:  Current Facility-Administered Medications   Medication Dose Route Frequency    acamprosate  666 mg Oral TID    folic acid  1 mg Oral Daily    gabapentin  200 mg Oral Q8H SCH    mirtazapine  45 mg Oral QHS    pantoprazole  40 mg Oral QAM AC    polyethylene glycol  17 g Oral Daily    thiamine  100 mg Oral Daily    vitamins/minerals  1 tablet Oral Daily     Continuous Infusions:  PRN Meds:.acetaminophen, alum & mag hydroxide-simethicone, diphenhydrAMINE **OR** diphenhydrAMINE, hydrOXYzine, ibuprofen, OLANZapine, QUEtiapine    Medical Work-up:  as per unit FNP  Consults:  as per unit FNP    Psychosocial  Plan:  Individual therapy: Psycho-education and psychotherapy of the following modalities will continue to be provided during daily visits:Supportive, Cognitive Behavioral Therapy, and Insight Oriented Psychotherapy  Group and milieu therapies: daily per unit's schedule.  Continue with Social Work intervention provided for discharge planning including assistance with placement and follow-up psychiatric care.    Plan for Family Involvement: None Available  Other Providers Contact Information and Dates Contacted: No Updates    I personally saw and examined the patient and spent 32 minutes on this visit reviewing previous notes, counseling the patient and/or family members regarding the patient's condition(s), ordering of tests, managing medications, and documenting the findings in the note.   Patient was also seen by Dr. Merlinda Frederick. The assessment and plan were discussed and formulated together and has been reviewed and updated as needed.      Signed by: Cecille Rubin, NP  11/05/2022  12:44 PM

## 2022-11-06 MED ORDER — GABAPENTIN 300 MG PO CAPS
300.0000 mg | ORAL_CAPSULE | Freq: Three times a day (TID) | ORAL | Status: DC
Start: 2022-11-06 — End: 2022-11-07
  Administered 2022-11-06 – 2022-11-07 (×2): 300 mg via ORAL
  Filled 2022-11-06 (×2): qty 1

## 2022-11-06 NOTE — Plan of Care (Signed)
Problem: IP Suicidal Ideation  Description: Gary [name] endorses suicidal ideation with plan to jump off bridge  Goal: STG: Gary [name] will not engage in self-injurious activities  Outcome: Not Progressing   Gary [name] endorses passive SI without intent.  Problem: Pain interferes with ability to perform ADL  Goal: Pain at adequate level as identified by patient  Description: Interventions:  1. Identify patient comfort function goal  2. Evaluate if patient comfort function goal is met  3. Assess pain on admission, during daily assessment and/or before any "as needed" intervention(s)  4. Reassess pain within 30-60 minutes of any procedure/intervention, per Pain Assessment, Intervention, Reassessment (AIR) Cycle  5. Evaluate patient's satisfaction with pain management progress  6. Offer non-pharmacological pain management interventions  7. Consult/collaborate with Pain Service  8. Consult/collaborate with Physical Therapy, Occupational Therapy, and/or Speech Therapy  9. Assess for risk of opioid induced respiratory depression and side effects, including snoring/sleep apnea. Alert healthcare team of risk factors identified.  10. Include patient/patient care companion in decisions related to pain management as needed  Outcome: Progressing   Gary [name] denies pain at this time.  Problem: IP Suicidal Ideation  Description: Gary [name] endorses suicidal ideation with plan to jump off bridge  Goal: LTG: Gary [name] will exhibit compliance with therapy  Outcome: Progressing   Gary [name] attended group therapy    Gary [name] was A+Ox4 with blunted affect and WNL speech. Endorses passive SI, Denies HI/AVH/SIB urges. BVC is 0. Pt states they are committed to maintaining their safety, encouraged to come to staff if thoughts or urges to harm self or others increases, Gary [name] verbalizes understanding. Pt is cooperative with Clinical research associate, able to answer questions and maintain eye contact. Pt describes their mood as "Good". Reported good   sleep/energy/appetite.  Gary [name] rated their depression  as 9/10 and anxiety as 7/10 (with 10 being the highest possible value). Gary [name] states their goal for today is to attend group therapies. Gary [name] is compliant with medication administration and mouth check completed. Gary [name] was visible in the milieu throughout the shift,  participated in groups with peers and staff appropriately. Q15 min safety checks maintained. Will continue to monitor and ensure safety.        "I, Jaynie Crumble RN, hereby attest that I have been made aware of and reviewed the following for this patient:    - Biopsychosocial assessment;  - Diagnoses;  - Nursing needs and medical support, if applicable; and  - Treatment Plan Goals, Objectives and Strategies/Interventions.    I am involved in the care of this patient within my scope of practice and assigned tasks. I approve the treatment plan and this attestation is in lieu of a signature on the document."

## 2022-11-06 NOTE — Progress Notes (Signed)
Psychiatry Progress Note  (Level 1 = Problem Focused, Level 2 = Expanded Problem Focused, Level 3 = Detailed)    Patient Name: Gary [name].            Current Date/Time:  7/20/20242:51 PM  MRN:  [id number]                            Attending Physician: Farris Has, MD  DOB: [birth year]                                Gender: male                              I. History   Informants: Patient, medical record, treatment team   A. Chief Complaint or Reason for Admission       Admitted for worsening depression and suicidal ideation.    B.History of Present Illness: Interval History     (Symptoms and qualifiers:1 for Level 1, 2 for Level 2 and 3+ for Level 3)  Patient is a 42 y.o. divorced, un-domiciled male with history of depression, anxiety, and alcohol use disorder admitted for worsening depression and suicidal ideation with a plan to jump off a bridge. Patient was evaluated by the [facility] and is currently admitted voluntarily.     November 06, 2022  Patient reports that his mood is "ok" today, but then later states that he is "more depressed."  Reports that one-on-one therapy is difficult for him, because he does not like talking about past stressors.  Patient reports that therapy has been triggering, and he finds himself ruminating on past mistakes and traumas.  Patient reports that anxiety is elevated today.  Hasn't noted improvement with gabapentin.  Patient states that if he felt like this outside of the hospital he would have relapsed on alcohol.  Patient reports 9/10 depression today.  Denies SI.  Open to increase in gabapentin to 300 mg TID to target anxiety symptoms.  Denies HI, AVH.      C.Medical History  Review of Systems  (Level 1 is none, Level 2 is 1, and Level 3 is 2+)  A complete 14 point ROS was done and was negative except for    Psychiatric: Depression, Anxiety  Constitutional: excessive daytime fatigue     D.Additional Past Psychiatric, Substance Use, Medical, Family and Social  History  Psychiatric: No new information available as compared to previous encounter(s)  Substance Use: No new information available as compared to previous encounter(s)  Medical: No new information available as compared to previous encounter(s)  Family: No new information available as compared to previous encounter(s)  Social: No new information available as compared to previous encounter(s)    Medications:   Current Medications       Scheduled       Medication Ordered Dose/Rate, Route, Frequency Last Action    acamprosate (CAMPRAL) tablet 666 mg    666 mg, PO, TID Given, 666 mg at 07/20 1343    folic acid (FOLVITE) tablet 1 mg    1 mg, PO, Daily Given, 1 mg at 07/20 0927    gabapentin (NEURONTIN) capsule 300 mg    300 mg, PO, Q8H SCH Ordered    mirtazapine (REMERON) tablet 45 mg    45 mg, PO, QHS Given, 45 mg at 07/19 2244  pantoprazole (PROTONIX) EC tablet 40 mg    40 mg, PO, QAM AC Given, 40 mg at 07/20 0800    polyethylene glycol (MIRALAX) packet 17 g    17 g, PO, Daily Given, 17 g at 07/20 4034    thiamine (VITAMIN B1) tablet 100 mg    100 mg, PO, Daily Given, 100 mg at 07/20 7425    vitamins/minerals tablet 1 tablet    1 tablet, PO, Daily Given, 1 tablet at 07/20 0928              PRN       Medication Ordered Dose/Rate, Route, Frequency Last Action    acetaminophen (TYLENOL) tablet 650 mg    650 mg, PO, Q6H PRN Ordered    alum & mag hydroxide-simethicone (MAALOX PLUS) 200-200-20 mg/5 mL suspension 30 mL    30 mL, PO, Q6H PRN Ordered    diphenhydrAMINE (BENADRYL) capsule 50 mg (Or Linked Group #1)    50 mg, PO, Once PRN Ordered    diphenhydrAMINE (BENADRYL) injection 50 mg (Or Linked Group #1)    50 mg, IM, Once PRN Ordered    hydrOXYzine (VISTARIL) capsule 75 mg    75 mg, PO, Q4H PRN Given, 75 mg at 07/19 2244    ibuprofen (ADVIL) tablet 800 mg    800 mg, PO, Q6H PRN Given, 800 mg at 07/19 2246    QUEtiapine (SEROquel) tablet 25 mg    25 mg, PO, QHS PRN Given, 25 mg at 07/17 2129                    II.  Examination   Vital signs reviewed:   Blood pressure 100/58, pulse 64, temperature 97.7 F (36.5 C), temperature source Oral, resp. rate 16, height 1.702 m (5\' 7" ), weight 68 kg (150 lb), SpO2 97%.     Mental Status Exam  (Level 1 is 1-5, Level 2 is 6-8, Level 3 is 9+)  General appearance: Appears chronological age, Good hygiene, and Good grooming  Attitude/Behavior: Cooperative, Eye Contact is  Good, and slightly anxious  Motor: No abnormalities noted  Gait: No obvious abnormalities  Muscle strength and tone: grossly intact  Speech:   Spontaneous: Yes  Rate and Rhythm: Normal  Volume: Normal  Tone: Normal  Clarity: Yes  Mood: "depressed"   Affect:   Range: Constricted  Stability: Stable  Appropriateness to thought content: Yes  Intensity: Normal  Thought Process:  Linear    Coherent: Yes  Logical:  Yes  Associations: Goal-directed  Thought Content:   Delusions:  Not elicited  Depressive Cognitions:  Hopeless, Helpless, Worthless, and Guilt  Suicidal:  No Suicidal thoughts  Homicidal:  No homicidal thoughts  Violent Thoughts:  No  Perceptions:   Hallucinations: No  Insight: Fair  Judgment: Poor  Cognition:   Level of Consciousness: Intact  Orientation: Intact to self, place and time    Psychiatric / Cognitive Instruments: None    Pertinent Physical Exam: Not relevant to current chief complaint/reason for admission    Imaging / EKG / Labs: Labs in the last 24 hours  Results       ** No results found for the last 24 hours. **          EKG Results   Cardiology Results       Procedure Component Value Units Date/Time    ECG 12 lead (Electrocardiogram) [[id number]] Collected: 10/28/22 1740     Updated: 10/28/22 1753  Ventricular Rate 103 BPM      Atrial Rate 103 BPM      P-R Interval 148 ms      QRS Duration 98 ms      Q-T Interval 334 ms      QTC Calculation (Bezet) 437 ms      P Axis 47 degrees      R Axis 49 degrees      T Axis 37 degrees      [facility] MUSE NARRATIVE AND IMPRESSION --     SINUS TACHYCARDIA  OTHERWISE  NORMAL ECG  NO PREVIOUS ECGS AVAILABLE  Confirmed by Neomia Dear MD, Ryer ([id number]) on 10/28/2022 5:53:08 PM      Narrative:      SINUS TACHYCARDIA  OTHERWISE NORMAL ECG  NO PREVIOUS ECGS AVAILABLE  Confirmed by Neomia Dear MD, Ramello ([id number]) on 10/28/2022 5:53:08 PM          Brain Scan No results found for any visits on 10/28/22.    III. Assessment and Plan (Medical Decision Making)     1. I certify that this patient continues to require inpatient hospitalization due to acute risk to self and unable to care for self in the community with insufficient support available    2. Psychiatric Diagnoses  Depression, unspecified  Rule out MDD vs substance-induced mood disorder  Social Anxiety Disorder  Alcohol Use Disorder  Rule out PTSD    3. Additional Diagnoses    4.All diagnostic procedures completed since admission were reviewed. Past medical records reviewed. Coordination of care was discussed with inpatient team and as available with the outpatient team.    5. On this admission patient educated about and provided input into their treatment plan.  Patient understands potential risks and benefits of proposed treatment plan.     6.  Assessment / Impression  Patient is a 42 y.o. divorced, un-domiciled male with history of depression, anxiety, and alcohol use disorder admitted for worsening depression and suicidal ideation with a plan to jump off a bridge. Patient was evaluated by the [facility] and is currently admitted voluntarily.     7/20:   The patient continues to report depressed mood and passive SI, reporting that he cannot identify a reason to live or to remain sober.  He reports minimal improvement in anxiety so far on gabapentin and reports 9/10 depression.  Ongoing cravings for alcohol.  Patient requires inpatient hospitalization for medication adjustment and safety.      Suicide Risk Assessment  - "I do not have a reason to live, I do not want to live"  - passive SI    Plan / Recommendations:    Biological Plan:  Medications:    Mirtazapine 45 mg qhs  Acamprosate 666 mg TID  Increase gabapentin to 300 mg TID (first dose 7/20)  Increase PRN vistaril from 50mg  to 75mg  q4 hours for anxiety  Scheduled Meds:  Current Facility-Administered Medications   Medication Dose Route Frequency    acamprosate  666 mg Oral TID    folic acid  1 mg Oral Daily    gabapentin  300 mg Oral Q8H SCH    mirtazapine  45 mg Oral QHS    pantoprazole  40 mg Oral QAM AC    polyethylene glycol  17 g Oral Daily    thiamine  100 mg Oral Daily    vitamins/minerals  1 tablet Oral Daily     Continuous Infusions:  PRN Meds:.acetaminophen, alum & mag hydroxide-simethicone, diphenhydrAMINE **OR** diphenhydrAMINE, hydrOXYzine, ibuprofen, QUEtiapine  Medical Work-up:  as per unit FNP  Consults:  as per unit FNP    Psychosocial Plan:  Individual therapy: Psycho-education and psychotherapy of the following modalities will continue to be provided during daily visits:Supportive, Cognitive Behavioral Therapy, and Insight Oriented Psychotherapy  Group and milieu therapies: daily per unit's schedule.  Continue with Social Work intervention provided for discharge planning including assistance with placement and follow-up psychiatric care.    Plan for Family Involvement: None Available  Other Providers Contact Information and Dates Contacted: No Updates    I personally saw and examined the patient and spent 32 minutes on this visit reviewing previous notes, counseling the patient and/or family members regarding the patient's condition(s), ordering of tests, managing medications, and documenting the findings in the note.   Patient was also seen by Dr. Merlinda Frederick. The assessment and plan were discussed and formulated together and has been reviewed and updated as needed.      Signed by: Farris Has, MD  11/06/2022  2:51 PM

## 2022-11-06 NOTE — Plan of Care (Addendum)
Problem: IP Suicidal Ideation  Description: Gary [name] endorses suicidal ideation with plan to jump off bridge  Goal: LTG: Gary [name] will exhibit compliance with therapy.  Outcome: Not Progressing  Patient remains nonadherent to group participation, however patient is complaint with medication regimen.     Problem: IP Suicidal Ideation  Description: Gary [name] endorses suicidal ideation with plan to jump off bridge.  Goal: STG: Gary [name] will not engage in self-injurious activities.  Outcome: Progressing  Patient remained safe and free of self-injurious behavior throughout the shift.     Gary [name] was alert and oriented times four and presented with appropriate speech; a depressed and anxious mood; mood-congruent affect; and a cooperative, attitude during interactions with this Clinical research associate. On approach, Gary [name] could be witnessed lying in bed, reading a book, with no distress noted. Patient remained isolative to himself and room for the entirety of shift, but was able to make his needs known. During assessment, patient described his mood as "the same," and continued to endorse severe depression and anxiety, passive suicidal ideations and pain. Patient was encouraged to report any worsening feelings of depression/anxiety, persistent thoughts of self harm/harm to others, and overall concerns to this Clinical research associate and staff; patient verbalized understanding. Gary [name] did deny feelings/thoughts of homicidal ideations and audiovisual hallucinations. Patient received PRN Ibuprofen 800 mg for body aches, which he rated a 6/10. Patient also received PRN Vistaril 75 mg for increased anxiety; medication had adequate effect. Gary [name] was medication compliant and mouth check was conducted after administration. Patient reported no issues with appetite, sleep and toileting habits. Overall, no major behavioral issues to report.      Q15 minute checks and hourly RN rounding for patient safety maintained, and plan of care continues.      Patient was witnessed resting for  approximately 4.5 hours as of 0600.

## 2022-11-06 NOTE — Progress Notes (Signed)
Pt did not attend groups today. He said that he would attend art (pt has been declining any talking groups since admission) but did not transition to groups.

## 2022-11-07 MED ORDER — GABAPENTIN 300 MG PO CAPS
400.0000 mg | ORAL_CAPSULE | Freq: Three times a day (TID) | ORAL | Status: DC
Start: 2022-11-07 — End: 2022-11-15
  Administered 2022-11-07 – 2022-11-15 (×25): 400 mg via ORAL
  Filled 2022-11-07 (×25): qty 1

## 2022-11-07 MED ORDER — QUETIAPINE FUMARATE 25 MG PO TABS
25.0000 mg | ORAL_TABLET | Freq: Two times a day (BID) | ORAL | Status: DC | PRN
Start: 2022-11-07 — End: 2022-11-17
  Filled 2022-11-07 (×2): qty 1

## 2022-11-07 NOTE — Group Note (Signed)
[facility] - Psychoeducational  Inpatient Behavioral Health Group Note    Patient Name:  Gary [name].       Medical Record Number: [id number]   Date of Birth: 1980/11/15  Sex: Male          Rehab Diagnosis: Suicidal ideation [R45.851];Alcohol dependence with uncomplicated intoxication [F10.220]     Today's Date: 11/07/2022   Group Start Time: 1115   Group End Time: 1200   Group Facilitators: Dava Najjar  Group Topic: BH - Psychoeducational  Discipline Led: Psycho -Ed Group  Number of Patient Participants: 6          Did not attend

## 2022-11-07 NOTE — Group Note (Signed)
[facility] - Creative  Inpatient Behavioral Health Group Note    Patient Name:  Gary [name].       Medical Record Number: [id number]   Date of Birth: [birth year]  Sex: Male          Rehab Diagnosis: Suicidal ideation [R45.851];Alcohol dependence with uncomplicated intoxication [F10.220]     Today's Date: 11/07/2022   Group Start Time: 1515   Group End Time: 1615   Group Facilitators: Dava Najjar  Group Topic: BH - Creative  Discipline Led: Creative  Number of Patient Participants: 7        Treatment Provided  Creative/Open Dialogue     Assessment  Benefited from support and encouragement of peers in a social setting  Participant(s) asked appropriate questions  Participant(s) had good participation  Benefited from psychosocial stimulation in a group setting    Pt initially was reserved but then began to open up to clinician and talk about various topics and engaged for the full duration of group.
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
## 2022-11-07 NOTE — Progress Notes (Signed)
Psychiatry Progress Note  (Level 1 = Problem Focused, Level 2 = Expanded Problem Focused, Level 3 = Detailed)    Patient Name: Gary [name].            Current Date/Time:  7/21/20244:05 PM  MRN:  [id number]                            Attending Physician: Farris Has, MD  DOB: 1981-01-29                                Gender: male                              I. History   Informants: Patient, medical record, treatment team   A. Chief Complaint or Reason for Admission       Admitted for worsening depression and suicidal ideation.    B.History of Present Illness: Interval History     (Symptoms and qualifiers:1 for Level 1, 2 for Level 2 and 3+ for Level 3)  Patient is a 42 y.o. divorced, un-domiciled male with history of depression, anxiety, and alcohol use disorder admitted for worsening depression and suicidal ideation with a plan to jump off a bridge. Patient was evaluated by the [facility] and is currently admitted voluntarily.     November 07, 2022  Patient reports that his mood is "ok" today, but continues to have thoughts of not wanting to live anymore, and having nothing to live for.  He reports that he felt down most of yesterday.  His appetite was poor.  Energy is low.  Feels hopeless at times.  Reports that anxiety is 7/10 an depression is 8/10.  Feels like he would drink if outside of the hospital.  Having trouble staying motivated.  Slept well with Seroquel.  Denies active SI, HI, or AVH     C.Medical History  Review of Systems  (Level 1 is none, Level 2 is 1, and Level 3 is 2+)  A complete 14 point ROS was done and was negative except for    Psychiatric: Depression, Anxiety, passive SI   Constitutional: excessive daytime fatigue     D.Additional Past Psychiatric, Substance Use, Medical, Family and Social History  Psychiatric: No new information available as compared to previous encounter(s)  Substance Use: No new information available as compared to previous encounter(s)  Medical: No new information  available as compared to previous encounter(s)  Family: No new information available as compared to previous encounter(s)  Social: No new information available as compared to previous encounter(s)    Medications:   Current Medications       Scheduled       Medication Ordered Dose/Rate, Route, Frequency Last Action    acamprosate (CAMPRAL) tablet 666 mg    666 mg, PO, TID Given, 666 mg at 07/21 1455    folic acid (FOLVITE) tablet 1 mg    1 mg, PO, Daily Given, 1 mg at 07/21 0940    gabapentin (NEURONTIN) capsule 400 mg    400 mg, PO, Q8H SCH Given, 400 mg at 07/21 1455    mirtazapine (REMERON) tablet 45 mg    45 mg, PO, QHS Given, 45 mg at 07/20 2143    pantoprazole (PROTONIX) EC tablet 40 mg    40 mg, PO, QAM AC Given, 40  mg at 07/21 0940    polyethylene glycol (MIRALAX) packet 17 g    17 g, PO, Daily Given, 17 g at 07/20 1191    thiamine (VITAMIN B1) tablet 100 mg    100 mg, PO, Daily Given, 100 mg at 07/21 0940    vitamins/minerals tablet 1 tablet    1 tablet, PO, Daily Given, 1 tablet at 07/21 0940              PRN       Medication Ordered Dose/Rate, Route, Frequency Last Action    acetaminophen (TYLENOL) tablet 650 mg    650 mg, PO, Q6H PRN Ordered    alum & mag hydroxide-simethicone (MAALOX PLUS) 200-200-20 mg/5 mL suspension 30 mL    30 mL, PO, Q6H PRN Ordered    diphenhydrAMINE (BENADRYL) capsule 50 mg (Or Linked Group #1)    50 mg, PO, Once PRN Ordered    diphenhydrAMINE (BENADRYL) injection 50 mg (Or Linked Group #1)    50 mg, IM, Once PRN Ordered    hydrOXYzine (VISTARIL) capsule 75 mg    75 mg, PO, Q4H PRN Given, 75 mg at 07/21 1315    ibuprofen (ADVIL) tablet 800 mg    800 mg, PO, Q6H PRN Given, 800 mg at 07/19 2246    QUEtiapine (SEROquel) tablet 25 mg    25 mg, PO, QHS PRN Given, 25 mg at 07/20 2253                    II. Examination   Vital signs reviewed:   Blood pressure 125/81, pulse 96, temperature 98.8 F (37.1 C), temperature source Oral, resp. rate 16, height 1.702 m (5\' 7" ), weight 68 kg (150  lb), SpO2 95%.     Mental Status Exam  (Level 1 is 1-5, Level 2 is 6-8, Level 3 is 9+)  General appearance: Appears chronological age, Good hygiene, and Good grooming  Attitude/Behavior: Cooperative, Eye Contact is  Good, and calm  Motor: No abnormalities noted  Gait: No obvious abnormalities  Muscle strength and tone: grossly intact  Speech:   Spontaneous: Yes  Rate and Rhythm: Normal  Volume: Normal  Tone: Normal  Clarity: Yes  Mood: "depressed" anxious   Affect:  Dysthymic    Range: Constricted  Stability: Stable  Appropriateness to thought content: Yes  Intensity: Normal  Thought Process:  Linear    Coherent: Yes  Logical:  Yes  Associations: Goal-directed  Thought Content:   Delusions:  Not elicited  Depressive Cognitions:  Hopeless, Helpless, Worthless, and Guilt  Suicidal:  Passive Suicidal thoughts  Homicidal:  No homicidal thoughts  Violent Thoughts:  No  Perceptions:   Hallucinations: No  Insight: Fair  Judgment: Poor  Cognition:   Level of Consciousness: Intact  Orientation: Intact to self, place and time    Psychiatric / Cognitive Instruments: None    Pertinent Physical Exam: Not relevant to current chief complaint/reason for admission    Imaging / EKG / Labs: Labs in the last 24 hours  Results       ** No results found for the last 24 hours. **          EKG Results   Cardiology Results       Procedure Component Value Units Date/Time    ECG 12 lead (Electrocardiogram) [[id number]] Collected: 10/28/22 1740     Updated: 10/28/22 1753     Ventricular Rate 103 BPM      Atrial Rate 103 BPM  P-R Interval 148 ms      QRS Duration 98 ms      Q-T Interval 334 ms      QTC Calculation (Bezet) 437 ms      P Axis 47 degrees      R Axis 49 degrees      T Axis 37 degrees      IHS MUSE NARRATIVE AND IMPRESSION --     SINUS TACHYCARDIA  OTHERWISE NORMAL ECG  NO PREVIOUS ECGS AVAILABLE  Confirmed by Neomia Dear MD, Marlo ([id number]) on 10/28/2022 5:53:08 PM      Narrative:      SINUS TACHYCARDIA  OTHERWISE NORMAL ECG  NO PREVIOUS  ECGS AVAILABLE  Confirmed by Neomia Dear MD, Durante ([id number]) on 10/28/2022 5:53:08 PM          Brain Scan No results found for any visits on 10/28/22.    III. Assessment and Plan (Medical Decision Making)     1. I certify that this patient continues to require inpatient hospitalization due to acute risk to self and unable to care for self in the community with insufficient support available    2. Psychiatric Diagnoses  Depression, unspecified  Rule out MDD vs substance-induced mood disorder  Social Anxiety Disorder  Alcohol Use Disorder  Rule out PTSD    3. Additional Diagnoses    4.All diagnostic procedures completed since admission were reviewed. Past medical records reviewed. Coordination of care was discussed with inpatient team and as available with the outpatient team.    5. On this admission patient educated about and provided input into their treatment plan.  Patient understands potential risks and benefits of proposed treatment plan.     6.  Assessment / Impression  Patient is a 42 y.o. divorced, un-domiciled male with history of depression, anxiety, and alcohol use disorder admitted for worsening depression and suicidal ideation with a plan to jump off a bridge. Patient was evaluated by the [facility] and is currently admitted voluntarily.     7/21:   The patient continues to report depressed mood and passive SI, reporting that he cannot identify a reason to live or to remain sober.  He reports minimal improvement in anxiety so far on gabapentin and reports 8/10 depression.  Ongoing cravings for alcohol.  Patient requires inpatient hospitalization for medication adjustment and safety.      Suicide Risk Assessment  - "I do not have a reason to live, I do not want to live"  - passive SI    Plan / Recommendations:    Biological Plan:  Medications:   Mirtazapine 45 mg qhs  Acamprosate 666 mg TID  gabapentin to 300 mg TID (first dose 7/20)  continue vistaril 75 mg q4 hours for anxiety  Scheduled Meds:  Current  Facility-Administered Medications   Medication Dose Route Frequency    acamprosate  666 mg Oral TID    folic acid  1 mg Oral Daily    gabapentin  400 mg Oral Q8H SCH    mirtazapine  45 mg Oral QHS    pantoprazole  40 mg Oral QAM AC    polyethylene glycol  17 g Oral Daily    thiamine  100 mg Oral Daily    vitamins/minerals  1 tablet Oral Daily     Continuous Infusions:  PRN Meds:.acetaminophen, alum & mag hydroxide-simethicone, diphenhydrAMINE **OR** diphenhydrAMINE, hydrOXYzine, ibuprofen, QUEtiapine    Medical Work-up:  as per unit FNP  Consults:  as per unit FNP    Psychosocial Plan:  Individual therapy: Psycho-education and psychotherapy of the following modalities will continue to be provided during daily visits:Supportive, Cognitive Behavioral Therapy, and Insight Oriented Psychotherapy  Group and milieu therapies: daily per unit's schedule.  Continue with Social Work intervention provided for discharge planning including assistance with placement and follow-up psychiatric care.    Plan for Family Involvement: None Available  Other Providers Contact Information and Dates Contacted: No Updates        Signed by: Farris Has, MD  11/07/2022  4:05 PM

## 2022-11-07 NOTE — Plan of Care (Signed)
Gary [name] denies any SI/HI/AVH/SIB, but does admit to frequent thoughts of wanting to end his life. He does not have a plan, but says he has started to feel better emotionally since being here. Gary [name] is able to contract for safety. Gary [name]'s speech is clear, he is A&Ox4 with intact cognition. He remains anxious and depressed but admits to improved mood and functioning. Gary [name] was briefly visible in the milieu with minimal interaction with his peers, being isolative to his room most of the shift. He appears to not be social and confides to himself. However, when conversing with staff, he can be very chatty and sociable and is willing to talk about his inner feelings to staff.  Gary [name] appeared to have been sleeping well, eyes closed, for 8-hours.    Problem: IP Suicidal Ideation  Description: Gary [name] endorses suicidal ideation with plan to jump off bridge  Goal: LTG: Gary [name] will exhibit compliance with therapy  Outcome: Progressing  Goal: STG: Gary [name] will not engage in self-injurious activities  Outcome: Progressing  Goal: STG: "to get help"  Outcome: Progressing    "I, Gary Damme, RN, hereby attest that I have been made aware of and reviewed the following for this patient:  - Biopsychosocial assessment;  - Diagnoses;  - Nursing needs and medical support, if applicable; and  - Treatment Plan Goals, Objectives and Strategies/Interventions.  I am involved in the care of this patient within my scope of practice and assigned tasks. I approve the treatment plan and this attestation is in lieu of a signature on the document."

## 2022-11-07 NOTE — Plan of Care (Signed)
Problem: IP Suicidal Ideation  Description: Gary [name] endorses suicidal ideation with plan to jump off bridge  Goal: LTG: [name] will exhibit compliance with therapy  Outcome: Progressing:  Gary [name] exhibited compliance with therapy during this Day Shift.  Goal: STG: Gary [name] will not engage in self-injurious activities  Outcome: Progressing:  Gary [name] did not engage in self-injurious activities during this Day Shift.     Upon entering his room this morning, [name] was awake lying in bed.  He was receptive to greeting and introduction from Gary [name] fundraiser.  He presented with a flat affect.  He maintained good eye contact during Daily Shift Assessment.  He is A+O x 4.  He stated his name and DOB prior to receiving his scheduled AM medication which also included Campral 666 MG PO.  He denied HI/AVH or urges to self-harm, however, when asked if he is having thoughts of wanting to end his life, he admitted to having fleeting thoughts of SI, which, for him, is an improvement from having had constant thoughts of SI.  He did not admit to having a plan when asked.  He reported sleeping "good" last night.  His appetite is "okay."  He described his energy level as "low."  When asked if he is having difficulty concentrating and focusing, he answered, "Yes."  He reported "feeling better."  He has been visible in the Milieu for brief periods where he was observed either attending group or walking on Unit Hallway.  He has been pleasant when approached.  He made his needs known politely.  He attended to his ADL's.  Gary [name] has maintained safety.  RN will monitor for continued safe behavior.    "I, Gary [name], hereby attest that I have been made aware of and reviewed the following for this patient:    - Biopsychosocial assessment;  - Diagnoses;  - Nursing needs and medical support, if applicable; and  - Treatment Plan Goals, Objectives and Strategies/Interventions.    I am involved in the care of this patient within my scope of practice and assigned tasks. I approve  the treatment plan and this attestation is in lieu of a signature on the document."

## 2022-11-08 NOTE — Care and Service Plan (Deleted)
Interdisciplinary Treatment Plan Update Meeting    11/08/2022  [name]    Participants:  Patient:  Gary [name].  Attending Physician:  Farris Has, MD  RN: Heywood Footman    Short Term Goal: waiting for interview with [facility], work on coping skills, going to groups    Long term: to go to [employer], plan on sobriety, working on reconnecting with family members.       Objective:  Review response to treatment, reassess needs/goals, update plan as indicated incorporating patient's strengths and stated needs, goals, and preferences.    1. Summary of Patient Progress on Treatment Plan Goals:  Pt  has been calm and cooperative with care.Compliant with medications. Denies si/hi/avh.Report  eating and sleeping  well.    2. Level of Patient Involvement:  Actively engaged/contributing    3. Patient Understanding of Plan of Care:  Able to verbalize goals and interventions    4. Level of Agreement/Commitment to Plan of Care:  Agrees with and is committed to plan of care

## 2022-11-08 NOTE — Group Note (Signed)
Group: BH - Creative  Group Topics: [employer]   Group Date: 11/08/2022  Start Time: 1600  End Time: 1700  Number of Participants: 7  Facilitators: Estelle Grumbles   Department: [facility] [address]    Discipline Led: BHT  Number of Patient Participants: 7      Name: Gary [name]. Date of Birth: 1981-04-13   MR: [id number]      Level of Participation: minimal  Quality of Participation:  pleasant and cooperative   Interactions with Others:  chatted appropriately with therapist though not with peers  Mood/Affect: anxious  Triggers (if applicable): N/A  Cognition: coherent/clear and goal directed  Organization:  UTA  Progress: Moderate  Response: Patient attended 2 out of 5 groups today. Patient said he is willing and able to go to the creative groups. Otherwise he said he gets too anxious to attend groups. Patient declined to work on an Event organiser but said he would listen to the music. Patient chatted with TW though not with other patients throughout the group.   Plan: patient will be encouraged to continue to attend groups     Patients Problems:   Patient Active Problem List   Diagnosis    Suicidal ideation

## 2022-11-08 NOTE — Plan of Care (Signed)
Problem: Side Effects from Pain Analgesia  Goal: Patient will experience minimal side effects of analgesic therapy  Description: Interventions:  1. Monitor/assess patient's respiratory status (RR depth, effort, breath sounds)  2. Assess for changes in cognitive function   3. Prevent/manage side effects per LIP orders (i.e. nausea, vomiting, pruritus, constipation, urinary retention, etc.)  4. Evaluate for opioid-induced sedation with appropriate assessment tool (i.e. POSS)  Outcome: Progressing  Pt made no medication side effect report     Problem: IP Suicidal Ideation  Description: Gary [name] endorses suicidal ideation with plan to jump off bridge  Goal: LTG: Gary [name] will exhibit compliance with therapy  Outcome: Progressing  Pt is complaint with medication and attends groups  Goal: STG: Gary [name] will not engage in self-injurious activities  Outcome: Progressing  Pt contracted to safety    Gary [name] was met in his room reading. He was nice and pleasant upon approach. He is alert and oriented x4. He endorsed Passive Suicide ideation without plan. He denied HI/AVH. He appears well groomed,bright affect, good eye contact, clear speech , somatic. He endorsed anxiety 7/10 and depression 8/10. He denied symptoms of pain. He was emotionally supported encouraged to come to writer when the need arises. He agreed. He was room isolative and made no interaction with peers. He is medication complaint and mouth checked observed. He maintained safety during the shift. Plan of care on going. He slept 8hrs.    "I, Verita Lamb, hereby attest that I have been made aware of and reviewed the following for this patient:    - Biopsychosocial assessment;  - Diagnoses;  - Nursing needs and medical support, if applicable; and  - Treatment Plan Goals, Objectives and Strategies/Interventions.    I am involved in the care of this patient within my scope of practice and assigned tasks. I approve the treatment plan and this attestation is in lieu of a signature  on the document."

## 2022-11-08 NOTE — UM Notes (Signed)
[facility]  [address]  [place], [place] [address]  NPI: [id number]  Tax ID: [id number]        Gary [name].   DOB: Aug 25, 1980  [facility]  ID: [id number]         Attending:  Dr. Graceann Congress, MD / NPI: [id number]     Inpatient CSR for 10/28/2022     Legal Status: Voluntary     7/21 Per BH Therapist note:  Pt attended 1 of 4 groups today (creative therapy)     7/21 Per Nursing note:  He denied HI/AVH or urges to self-harm, however, when asked if he is having thoughts of wanting to end his life, he admitted to having fleeting thoughts of SI, which, for him, is an improvement from having had constant thoughts of SI.  He did not admit to having a plan when asked.      He described his energy level as "low."  When asked if he is having difficulty concentrating and focusing, he answered, "Yes."        7/22 Per chart note of NP Cecille Rubin:  Anxiety/depression:  - "worried everything is not going to work out"  -- reported anxiety is 7-8/10 when he is not taking gabapentin, but reported feeling lower anxiety post gabapentin    Alcohol cravings:  - still there  - discussed gabapentin was increased recently    Pending:  - interview with [facility] interview    7/22 Per Nursing note:  He endorsed Passive Suicide ideation without plan. He denied HI/AVH. He appears well groomed,bright affect, good eye contact, clear speech , somatic. He endorsed anxiety 7/10 and depression 8/10.     7/22 VS:  T 97.5, HR 71, RR 18, bp 106/68, Pox 95%    7/22 Psychiatric Diagnoses per NP Eldridge Dace:   Depression, unspecified  Rule out MDD vs substance-induced mood disorder  Social Anxiety Disorder  Alcohol Use Disorder  Rule out PTSD     7/22 Mental Status Exam per NP Cecille Rubin:  General appearance: Appears chronological age, Good hygiene, and Good grooming  Attitude/Behavior: Cooperative, Eye Contact is  Good, and calm  Motor: No abnormalities noted  Gait: No obvious  abnormalities  Muscle strength and tone: grossly intact  Speech:  Spontaneous: Yes  Rate and Rhythm: Normal  Volume: Normal  Tone: Normal  Clarity: Yes  Mood:anxious   Affect:  Dysthymic   Range: Constricted  Stability: Stable  Appropriateness to thought content: Yes  Intensity: Normal  Thought Process:  Linear   Coherent: Yes  Logical:  Yes  Associations: Goal-directed  Thought Content:  Delusions:  Not elicited  Depressive Cognitions:   Worthless, and Guilt, some hope expressed  Suicidal:  Passive Suicidal thoughts  Homicidal:  No homicidal thoughts  Violent Thoughts:  No  Perceptions:  Hallucinations: No  Insight: Fair  Judgment: Fair  Cognition:  Level of Consciousness: Intact  Orientation: Intact to self, place and time    7/22 Assessment / Plan per NP Cecille Rubin:  continues to report anxious and depressive cognitions , and struggles with alcohol craving. Reported getting relief at moments of taking gabapentin. Today is the first time patient denied struggles with SI. Continue current med dosing; gabapentin was increased yesterday.  - pending interview with [employer] (expected this week)  Plan for Family Involvement: None Available     7/22 Scheduled and PRN Meds:  Campral 666mg  po tid  Neurontin 100mg  po q8hr,  start 7/18-->200mg  po q8hr (7/19)-->300mg  po q8hr (7/20)-->400mg  po q8hr (7/21)  Remeron 45mg  po qHS   Vistaril 50mg  po q4hr prn-->75mg  po q4hr prn (7/19) (rec'd 2 doses on 7/19, 1 dose on 7/20 & 1 dose on 7/21)  Seroquel 25mg  po bid prn      7/22 Treatment plan:  Activity as tolerated  Suicide precautions  Close monitoring q checks        VS bid   Medication management and stabilization  Group/individual therapy  Therapeutic Recreation eval and treat  D/c planning                        Rowan Blase, BSN, RN, [employer], ACM-RN          Utilization Review   [facility]  [address]  [address]  [place], [place] [address]  Phone: [phone] (VM)  Main  Line: [phone]  Fax: [phone]

## 2022-11-08 NOTE — Progress Notes (Addendum)
SW met with Pt at bedside to discuss the [facility] application process and the scholarship referral process once accepted.     SW spoke with  [address] worker ([address] [place], [email or web address]   [phone]) who confirmed he received the Pt's signed application form.     He stated he is currently attempting to schedule an interview time and date for the Pt. And once confirmed he will follow up with the SW.     Once Pt is approved for [employer] SW will contact [facility] [phone] or [address] Lucita Ferrara  [phone] grant for [employer] to request scholarship application for Pt.     Tyna Jaksch, MSW

## 2022-11-08 NOTE — Care and Service Plan (Signed)
Interdisciplinary Treatment Plan Update Meeting    11/08/2022  Gary [name].    Participants:  Patient:  Gary [name].  Attending Physician:  Gary Has, MD  TD:DUKGU Gary [name] term:   waiting for interview with [facility], work on coping skills, going to groups  Long term:   to go to [employer], plan on sobriety, working on reconnecting with family members.   Long Term Goal:     Objective:  Review response to treatment, reassess needs/goals, update plan as indicated incorporating patient's strengths and stated needs, goals, and preferences.      1. Summary of Patient Progress on Treatment Plan Goals:  Pt [name] been calm and cooperative with  care.Adherent to medications.Denies sihiavh.Report sleeping and eating well.    2. Level of Patient Involvement:  Actively engaged/contributing    3. Patient Understanding of Plan of Care:  Able to verbalize goals and interventions    4. Level of Agreement/Commitment to Plan of Care:  Agrees with and is committed to plan of care

## 2022-11-08 NOTE — Progress Notes (Signed)
Psychiatry Progress Note  (Level 1 = Problem Focused, Level 2 = Expanded Problem Focused, Level 3 = Detailed)    Patient Name: Gary [name].            Current Date/Time:  7/22/20249:08 AM  MRN:  [id number]                            Attending Physician: Farris Has, MD  DOB: 1981/02/15                                Gender: male                              I. History   Informants: Patient, medical record, treatment team   A. Chief Complaint or Reason for Admission       Admitted for worsening depression and suicidal ideation.    B.History of Present Illness: Interval History     (Symptoms and qualifiers:1 for Level 1, 2 for Level 2 and 3+ for Level 3)  Patient is a 42 y.o. divorced, un-domiciled male with history of depression, anxiety, and alcohol use disorder admitted for worsening depression and suicidal ideation with a plan to jump off a bridge. Patient was evaluated by the [facility] and is currently admitted voluntarily.     November 08, 2022  Patient was rounded early morning with attending.    Pt was resting on bed, but fully awake. Had eaten breakfast, showered.    Anxiety/depression:  - "worried everything is not going to work out"  -- reported anxiety is 7-8/10 when he is not taking gabapentin, but reported feeling lower anxiety post gabapentin    Withdrawal seizure history:  - reported hx of "could not move my body, could not really talk" while awake. Pt was told this was more like a sleep paralysis, but not alcohol withdrawal seizure.    Alcohol cravings:  - still there  - discussed gabapentin was increased recently      SI:  - denied today    Pending:  - interview with [facility]      ITP  Short term: waiting for interview with [facility], work on Pharmacologist, going to groups  Long term: to go to [employer], plan on sobriety, working on reconnecting with family members.      C.Medical History  Review of Systems  (Level 1 is none, Level 2 is 1, and Level 3 is 2+)  A complete 14 point ROS  was done and was negative except for    Psychiatric: Depression, Anxiety,   Constitutional: craving     D.Additional Past Psychiatric, Substance Use, Medical, Family and Social History  Psychiatric: No new information available as compared to previous encounter(s)  Substance Use: No new information available as compared to previous encounter(s)  Medical: No new information available as compared to previous encounter(s)  Family: No new information available as compared to previous encounter(s)  Social: No new information available as compared to previous encounter(s)    Medications:   Current Medications       Scheduled       Medication Ordered Dose/Rate, Route, Frequency Last Action    acamprosate (CAMPRAL) tablet 666 mg    666 mg, PO, TID Given, 666 mg at 07/22 0853    folic acid (FOLVITE) tablet  1 mg    1 mg, PO, Daily Given, 1 mg at 07/22 0853    gabapentin (NEURONTIN) capsule 400 mg    400 mg, PO, Q8H SCH Given, 400 mg at 07/22 0853    mirtazapine (REMERON) tablet 45 mg    45 mg, PO, QHS Given, 45 mg at 07/21 2153    pantoprazole (PROTONIX) EC tablet 40 mg    40 mg, PO, QAM AC Given, 40 mg at 07/22 0734    polyethylene glycol (MIRALAX) packet 17 g    17 g, PO, Daily Given, 17 g at 07/20 5409    thiamine (VITAMIN B1) tablet 100 mg    100 mg, PO, Daily Given, 100 mg at 07/22 0853    vitamins/minerals tablet 1 tablet    1 tablet, PO, Daily Given, 1 tablet at 07/22 0853              PRN       Medication Ordered Dose/Rate, Route, Frequency Last Action    acetaminophen (TYLENOL) tablet 650 mg    650 mg, PO, Q6H PRN Ordered    alum & mag hydroxide-simethicone (MAALOX PLUS) 200-200-20 mg/5 mL suspension 30 mL    30 mL, PO, Q6H PRN Ordered    diphenhydrAMINE (BENADRYL) capsule 50 mg (Or Linked Group #1)    50 mg, PO, Once PRN Ordered    diphenhydrAMINE (BENADRYL) injection 50 mg (Or Linked Group #1)    50 mg, IM, Once PRN Ordered    hydrOXYzine (VISTARIL) capsule 75 mg    75 mg, PO, Q4H PRN Given, 75 mg at 07/21 1315     ibuprofen (ADVIL) tablet 800 mg    800 mg, PO, Q6H PRN Given, 800 mg at 07/22 0857    QUEtiapine (SEROquel) tablet 25 mg    25 mg, PO, BID PRN Ordered                    II. Examination   Vital signs reviewed:   Blood pressure 106/68, pulse 71, temperature 97.5 F (36.4 C), temperature source Oral, resp. rate 18, height 1.702 m (5\' 7" ), weight 68 kg (150 lb), SpO2 95%.     Mental Status Exam  (Level 1 is 1-5, Level 2 is 6-8, Level 3 is 9+)  General appearance: Appears chronological age, Good hygiene, and Good grooming  Attitude/Behavior: Cooperative, Eye Contact is  Good, and calm  Motor: No abnormalities noted  Gait: No obvious abnormalities  Muscle strength and tone: grossly intact  Speech:   Spontaneous: Yes  Rate and Rhythm: Normal  Volume: Normal  Tone: Normal  Clarity: Yes  Mood:anxious   Affect:  Dysthymic    Range: Constricted  Stability: Stable  Appropriateness to thought content: Yes  Intensity: Normal  Thought Process:  Linear    Coherent: Yes  Logical:  Yes  Associations: Goal-directed  Thought Content:   Delusions:  Not elicited  Depressive Cognitions:   Worthless, and Guilt, some hope expressed  Suicidal:  Passive Suicidal thoughts  Homicidal:  No homicidal thoughts  Violent Thoughts:  No  Perceptions:   Hallucinations: No  Insight: Fair  Judgment: Fair  Cognition:   Level of Consciousness: Intact  Orientation: Intact to self, place and time    Psychiatric / Cognitive Instruments: None    Pertinent Physical Exam: Not relevant to current chief complaint/reason for admission    Imaging / EKG / Labs: Labs in the last 24 hours  Results       **  No results found for the last 24 hours. **          EKG Results   Cardiology Results       Procedure Component Value Units Date/Time    ECG 12 lead (Electrocardiogram) [[id number]] Collected: 10/28/22 1740     Updated: 10/28/22 1753     Ventricular Rate 103 BPM      Atrial Rate 103 BPM      P-R Interval 148 ms      QRS Duration 98 ms      Q-T Interval 334 ms       QTC Calculation (Bezet) 437 ms      P Axis 47 degrees      R Axis 49 degrees      T Axis 37 degrees      IHS MUSE NARRATIVE AND IMPRESSION --     SINUS TACHYCARDIA  OTHERWISE NORMAL ECG  NO PREVIOUS ECGS AVAILABLE  Confirmed by Neomia Dear MD, Blayde ([id number]) on 10/28/2022 5:53:08 PM      Narrative:      SINUS TACHYCARDIA  OTHERWISE NORMAL ECG  NO PREVIOUS ECGS AVAILABLE  Confirmed by Neomia Dear MD, Muaz ([id number]) on 10/28/2022 5:53:08 PM          Brain Scan No results found for any visits on 10/28/22.    III. Assessment and Plan (Medical Decision Making)     1. I certify that this patient continues to require inpatient hospitalization due to acute risk to self and unable to care for self in the community with insufficient support available    2. Psychiatric Diagnoses  Depression, unspecified  Rule out MDD vs substance-induced mood disorder  Social Anxiety Disorder  Alcohol Use Disorder  Rule out PTSD    3. Additional Diagnoses    4.All diagnostic procedures completed since admission were reviewed. Past medical records reviewed. Coordination of care was discussed with inpatient team and as available with the outpatient team.    5. On this admission patient educated about and provided input into their treatment plan.  Patient understands potential risks and benefits of proposed treatment plan.     6.  Assessment / Impression  Patient is a 42 y.o. divorced, un-domiciled male with history of depression, anxiety, and alcohol use disorder admitted for worsening depression and suicidal ideation with a plan to jump off a bridge. Patient was evaluated by the [facility] and is currently admitted voluntarily.     7/21:   The patient continues to report depressed mood and passive SI, reporting that he cannot identify a reason to live or to remain sober.  He reports minimal improvement in anxiety so far on gabapentin and reports 8/10 depression.  Ongoing cravings for alcohol.  Patient requires inpatient hospitalization for medication adjustment and safety.       7/22:   continues to report anxious and depressive cognitions , and struggles with alcohol craving. Reported getting relief at moments of taking gabapentin. Today is the first time patient denied struggles with SI. Continue current med dosing; gabapentin was increased yesterday.  -pending interview with [employer] (expected this week)    Suicide Risk Assessment  - denied SI at this time    Plan / Recommendations:  Biological Plan:  Medications:   Mirtazapine 45 mg qhs  Acamprosate 666 mg TID  gabapentin 400 mg TID (first dose 7/20)  PRN  vistaril 75 mg q4 hours for anxiety  Scheduled Meds:  Current Facility-Administered Medications   Medication Dose Route Frequency  acamprosate  666 mg Oral TID    folic acid  1 mg Oral Daily    gabapentin  400 mg Oral Q8H SCH    mirtazapine  45 mg Oral QHS    pantoprazole  40 mg Oral QAM AC    polyethylene glycol  17 g Oral Daily    thiamine  100 mg Oral Daily    vitamins/minerals  1 tablet Oral Daily     Continuous Infusions:  PRN Meds:.acetaminophen, alum & mag hydroxide-simethicone, diphenhydrAMINE **OR** diphenhydrAMINE, hydrOXYzine, ibuprofen, QUEtiapine    Medical Work-up:  as per unit FNP  Consults:  as per unit FNP    Psychosocial Plan:  Individual therapy: Psycho-education and psychotherapy of the following modalities will continue to be provided during daily visits:Supportive, Cognitive Behavioral Therapy, and Insight Oriented Psychotherapy  Group and milieu therapies: daily per unit's schedule.  Continue with Social Work intervention provided for discharge planning including assistance with placement and follow-up psychiatric care.    Plan for Family Involvement: None Available  Other Providers Contact Information and Dates Contacted: No Updates    I personally saw and examined the patient and spent 27 minutes.     Details of Time Spent:   Direct Patient Contact: Conducted psychiatric exam, assessment, and evaluation.   Coordinating Care: Coordinated care plans  with clinical team  Counseling: Provided counseling to the patient regarding treatment options, management of symptoms, and lifestyle modifications.     Case discussed with attending Dr Merlinda Frederick, and treatment plan reviewed together.    Signed by: Cecille Rubin, NP  11/08/2022  11:03 AM

## 2022-11-08 NOTE — Plan of Care (Signed)
Problem: IP Suicidal Ideation  Description: Gary [name] endorses suicidal ideation with plan to jump off bridge  Goal: LTG: Gary [name] will exhibit compliance with therapy  Outcome: Progressing  Gary [name]  attended  music therapy  compliant with medications  Goal: STG: Gary [name] will not engage in self-injurious activities  Outcome: Progressing  Gary [name] did not engaged  in self  injurious behavior  Goal: STG: Gary [name] will identify 3 internal coping strategies  Outcome: Progressing  Gary [name] was A+Ox4 with  blunted affect and  clear speech. Denies SI/HI/AVH/SIB urges. BVC is 0. Pt states they is  committed to maintaining their safety, encouraged to come to staff if thoughts or urges to harm self or others increases, Gary [name] verbalizes understanding. Pt is  calm and cooperative with Clinical research associate, able to answer questions and maintain  good eye contact. Pt describes their mood as  blunted '' Report   good sleep/energy/appetite.  Gary [name] rated their depression as 4/10 and anxiety as 4/10 (with 10 being the highest possible value). Gary [name] states their goal for today is  to  talk to Child psychotherapist  about discharge planing. Gary [name]  is compliant compliant with medication administration and mouth check completed. Gary [name] was  briefly  seen  in the milieu,isolative to self throughout the shift,  attended  music  group Q15 min safety checks maintained. Will continue to monitor and ensure safety.

## 2022-11-08 NOTE — Group Note (Signed)
BH - Music Therapy and Art Therapy  Inpatient Behavioral Health Group Note    Patient Name:  Gary [name].       Medical Record Number: [id number]   Date of Birth: [birth year]  Sex: Male          Rehab Diagnosis: Suicidal ideation [R45.851];Alcohol dependence with uncomplicated intoxication [F10.220]     Today's Date: 11/08/2022   Group Start Time: 1300   Group End Time: 1400   Group Facilitators: Yehuda Savannah; Jaynie Collins, Sin Hui  Group Topic: [facility] - Music Therapy  Discipline Led: Music and Art Therapy  Number of Patient Participants: 7    Music was presented in accompaniment with art therapy. The art therapy activity consisted of a finger puppet intended to resemble oneself in the moment. Materials utilized for this activity included model magic clay, rhinestones, and eyes. Music provided a background ambience to shape the environment of the room as well as an opportunity for relaxation and expression. MT provided a combination of pt requested songs, instrumental improvisation, and instrumental familiar songs. Equipment used included guitar, bluetooth speaker, and voice.        Treatment Provided  Pt arrived to session and sat at a table next to MT away from peers. Pt presented with a flat affect. Prior to session, pt told AT that he had received permission to observe session and not actively participate from [facility]. Pt observed and listened intently throughout session and left a few minutes prior to the end of the session. Pt maintained affect throughout the time he was in attendance.  See Education Activity for details.    Assessment  Benefited from psychosocial stimulation in a group setting    Ileene Musa  Music Therapy Intern    Yehuda Savannah, MA, MT-BC  Music Therapist-Board Certified   [email or web address]

## 2022-11-09 ENCOUNTER — Inpatient Hospital Stay: Payer: Medicaid Other

## 2022-11-09 DIAGNOSIS — N200 Calculus of kidney: Secondary | ICD-10-CM

## 2022-11-09 LAB — COMPREHENSIVE METABOLIC PANEL
ALT: 102 U/L — ABNORMAL HIGH (ref 0–55)
AST (SGOT): 51 U/L — ABNORMAL HIGH (ref 5–41)
Albumin/Globulin Ratio: 1.2 (ref 0.9–2.2)
Albumin: 4 g/dL (ref 3.5–5.0)
Alkaline Phosphatase: 81 U/L (ref 37–117)
Anion Gap: 10 (ref 5.0–15.0)
BUN: 16 mg/dL (ref 9–28)
Bilirubin, Total: 0.2 mg/dL (ref 0.2–1.2)
CO2: 27 mEq/L (ref 17–29)
Calcium: 9.1 mg/dL (ref 8.5–10.5)
Chloride: 105 mEq/L (ref 99–111)
Creatinine: 1.1 mg/dL (ref 0.5–1.5)
GFR: >60 mL/min/{1.73_m2} (ref >=60.0)
Globulin: 3.3 g/dL (ref 2.0–3.6)
Glucose: 119 mg/dL — ABNORMAL HIGH (ref 70–100)
Hemolysis Index: 6 Index
Potassium: 4.4 mEq/L (ref 3.5–5.3)
Protein, Total: 7.3 g/dL (ref 6.0–8.3)
Sodium: 142 mEq/L (ref 135–145)

## 2022-11-09 LAB — CBC
Absolute nRBC: 0 10*3/uL (ref <=0.00)
Hematocrit: 43.3 % (ref 37.6–49.6)
Hemoglobin: 14.2 g/dL (ref 12.5–17.1)
MCH: 31.3 pg (ref 25.1–33.5)
MCHC: 32.8 g/dL (ref 31.5–35.8)
MCV: 95.4 fL (ref 78.0–96.0)
MPV: 10.2 fL (ref 8.9–12.5)
Platelet Count: 311 10*3/uL (ref 142–346)
RBC: 4.54 10*6/uL (ref 4.20–5.90)
RDW: 16 % — ABNORMAL HIGH (ref 11–15)
WBC: 8.22 10*3/uL (ref 3.10–9.50)
nRBC %: 0 /100 WBC (ref <=0.0)
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
## 2022-11-09 NOTE — Progress Notes (Signed)
Daily Note:     Patient attended 0 out of 5 groups today. Patient said he would have gone to creative therapy today but he had an interview with [employer]. Patient was given handouts on positive coping statements and grounding techniques. Patient said his anxiety and depression are still very high with passive SI. Patient said he is safe on the unit and will come to staff if that changes. Patient said he did some laps out on the unit today and watched tv for about 3 hours. Patient said he will at least go to creative therapy tomorrow. Will continue to monitor, assess and encourage group attendance.

## 2022-11-09 NOTE — Consults (Signed)
MEDICINE NEW CONSULT    Date Time: 11/09/22 12:06 PM  Patient Name: Gary [name],[name]  Requesting Physician: Farris Has, MD  Consulting Physician: Mickel Crow, BS    Primary Care Physician: Marisa Sprinkles, MD    Reason for Consultation: Abdominal pain, r/o nephrolithiasis      Assessment:     Patient Active Problem List    Diagnosis Date Noted    Suicidal ideation 10/28/2022     42 y/o man with history of nephrolithiasis, alcohol use disorder, low back pain, and depression, admitted for management of depression and suicidal ideation, now with worsening abdominal pain and dysuria, most concerning for persistent or recurrent nephrolithiasis without signs of sepsis. Currently afebrile and hemodynamically stable.    Recommendations:   #Abdominal pain  With history of recent 1.6 cm stone, concern for persistent nephrolithiasis. Patient is still urinating well. Patient has dysuria but otherwise no indication of infection/sepsis (afebrile, no subjective chills, nausea). Low suspicion for acute surgical pathology I.e. testicular torsion, appendicitis, based on exam and history. May also be a component of constipation and gas pain contributing to his abdominal discomfort, given his distention and tympany. Still passing flatus, so low suspicion for complete obstruction  - CBC with diff, CMP, UA ordered  - Follow up CT ab/p  - If <10 mm stone present without significant obstruction, would proceed with pain management, hydration +/- alpha blocker  - If obstruction, significant AKI, or intractable pain, may require urologic intervention  - Encourage PO fluid consumption, goal UOP>2500 mL/day  - Pain management: continue acetaminophen 650 mg PRN, ibuprofen 800 mg q6h PRN   - Added oxycodone 5 mg q6h, one-time dilaudid 1 mg for breakthrough  - Can defer antibiotics pending UA  - Bowel regimen: Miralax once daily, can increase to BID, recommend adding Pericolace BID PRN    #GERD  - Continue PPI    #Alcohol  use disorder  - Continue acamprosate, folate, thiamine    #Low back pain  - Continue gabapentin    #Depression  - Per psychiatry team    DVT Ppx: not indicated, patient ambulating  Diet: Regular  Telemetry: Not indicated  Full code    Farris Has, MD, thank you for this consultation.  We will follow the patient with you during this hospitalization. Please contact us with any questions or issues.                History of Presenting Illness:   Gary Larkey. is a 42 y.o. male with history of nephrolithiasis, low back pain, alcohol use disorder, and depression, admitted to inpatient psychiatry for management of depression with suicidal ideation. Medicine was consulted for concern of abdominal pain and nephrolithiasis.    Of note, he states that he was seen in a [facility] in June for severe abdominal pain that had him "doubled over." He was also having "fire-colored" urine. He underwent a CT ab/p 6/26, which revealed a 1.6 cm right nephrolithiasis with moderate dilation of mid and lower pole calyces. He states that he had a discussion with a surgeon who counseled him on the risk of infection and kidney problems with a stone that size, but he could not undergo surgery at that time as he was living on the street. He states that he was eventually planning for stone removal in August.    Today, Gary [name] reports that over the past 2 days he has had progressively worsening right-sided abdominal pain that radiates  to the middle of his abdomen and to his back. He states that since his hospitalization in June he has had constant pain that has been relatively tolerable but it was always contained to the right side. The pain worsens with stretching and urinating. It is currently an 8/10, but improves to a 6/10 with Motrin. He was able to sleep through the night. He also states that his abdomen appears larger today than usual, and it feels full. He reports 1 week of dysuria; he is still producing his usual  amount of urine and it is "crystal clear." States that he still has an appetite and has good PO intake. Denies testicular/scrotal pain. He also notes that he has been somewhat constipated. He typically has 1 soft bowel movement per day but for the past several days has had 2-3 hard, pellet-like bowel movements per day. States that Miralax is not helping. Denies fevers/chills, nausea, vomiting, diarrhea.    He denies history of other medical problems. No history of abdominal surgery. No family history of nephrolithiasis. He does not currently have housing or outpatient medical care.      Past Medical History:     Past Medical History:   Diagnosis Date    Anxiety     Depression        Available old records reviewed, including:  collateral from Epic, Care everywhere including CT ab/p 6/26    Past Surgical History:   History reviewed. No pertinent surgical history.    Family History:   History reviewed. No pertinent family history.    Social History:   Tobacco Use History[1]  Social History     Substance and Sexual Activity   Alcohol Use Yes    Comment: daily- "until I pass out" " Yesterday I had 3 pints of vodka" 10/28/2022     Social History     Substance and Sexual Activity   Drug Use Never       Allergies:   Allergies[2]    Medications:     Home Medications       Med List Status: Complete Set By: Katrinka Blazing, RN at 10/29/2022 12:49 AM              ibuprofen (ADVIL) 800 MG tablet     Take 1 tablet (800 mg) by mouth 3 (three) times daily as needed     omeprazole (PriLOSEC) 40 MG capsule     Take 1 capsule (40 mg) by mouth daily            Current Facility-Administered Medications   Medication Dose Route Frequency    acamprosate  666 mg Oral TID    folic acid  1 mg Oral Daily    gabapentin  400 mg Oral Q8H SCH    mirtazapine  45 mg Oral QHS    pantoprazole  40 mg Oral QAM AC    polyethylene glycol  17 g Oral Daily    thiamine  100 mg Oral Daily    vitamins/minerals  1 tablet Oral Daily            Review of Systems:    All other systems were reviewed and are negative unless noted in HPI.    Physical Exam:   Patient Vitals for the past 24 hrs:   BP Temp Temp src Pulse SpO2   11/09/22 0649 104/67 97.7 F (36.5 C) Oral 69 97 %   11/08/22 1942 125/85 99.1 F (37.3 C) Oral 90 95 %   11/08/22  1543 130/90 100.4 F (38 C) Oral (!) 111 95 %     Body mass index is 23.49 kg/m.  No intake or output data in the 24 hours ending 11/09/22 1206    General: awake, alert, oriented x 3; walking around room in no distress, well-appearing  Cardiovascular: normal rate and regular rhythm, no murmurs appreciated  Lungs: normal effort on room air, good bibasilar air entry, no wheezing, rhonchi, or rales  Abdomen: Normal bowel sounds. Abdomen moderately distended without fluid wave, tympanic, soft, diffuse mild tenderness to deep palpation, no masses or hernias, no rebound or guarding.  Extremities: no edema  Neuro: no lateralizing deficits  Skin: no rashes or lesions noted    Labs:   No results for input(s): "WBC", "HGB", "HCT", "PLT" in the last 72 hours.    No results for input(s): "NA", "K", "CL", "CO2", "BUN", "CREAT", "GLU", "CA", "MG", "PHOS" in the last 72 hours.    No results for input(s): "AST", "ALT", "ALKPHOS", "PROT", "ALB" in the last 72 hours.    No results for input(s): "PTT", "PT", "INR" in the last 72 hours.    Imaging personally reviewed, including:   No results found.        Dewain Penning [place], [place]  11/09/2022  12:06 PM      cc: Farris Has, MD  Pcp, None, MD         [1]   Social History  Tobacco Use   Smoking Status Never   Smokeless Tobacco Never   [2]   Allergies  Allergen Reactions    Abilify [Aripiprazole]     Effexor [Venlafaxine]     Haldol [Haloperidol]     Lithium     Paxil [Paroxetine]     Prozac [Fluoxetine]     Vivitrol [Naltrexone]

## 2022-11-09 NOTE — Plan of Care (Signed)
Problem: IP Suicidal Ideation  Description: Gary [name] endorses suicidal ideation with plan to jump off bridge  Goal: LTG: Gary [name] will exhibit compliance with therapy  Outcome: Progressing  Gary [name] attended 2 groups  Goal: STG: Gary [name] will not engage in self-injurious activities  Outcome: Progressing  Pt contracted to safety  Goal: STG: Gary [name] will identify 3 internal coping strategies  Outcome: Progressing  Pt reads a book,takes a walk and verbalize his concerns    Gary [name] was met in his room gazing through the window. He was nice and pleasant upon approach. He is alert and oriented x4. He  denied SI/HI/AVH. He replied "I attended 2 groups today my highest is often one"  He said that with smile and confidence. He appears well groomed,bright affect, good eye contact, clear speech , somatic. He endorsed anxiety 7/10 and depression 8/10. He symptoms of  abdominal pain . Pt reported the pain medications are not helping and prefers CT scan to know the status of his kidney stones. He was emotionally supported encouraged to come to writer when the need arises. He agreed. He was seen briefly in the milieu though did not interact with peers. He is medication complaint and mouth checked observed. He maintained safety during the shift. Plan of care on going. He slept 8hrs

## 2022-11-09 NOTE — Progress Notes (Signed)
The SW spoke with [facility] worker ([address] [place], [email or web address]   [phone]) who stated Pt's interview is scheduled for 3:30 pm today via zoom.

## 2022-11-09 NOTE — Plan of Care (Signed)
Gary [name] was A+Ox4 with  blunted  affect  depressed   mood and  clear speech. Denies HI/AVH/SIB urges. BVC is 0. Pt endorsed    passive SI .Pt states they  IS  committed to maintaining their safety, encouraged to come to staff if thoughts or urges to harm self or others increases, Gary [name] verbalizes understanding. Pt is  calm and cooperative with Clinical research associate, able to answer questions and maintain  good  eye contact. Pt describes their mood as   blunted.Report good  sleep/energy/appetite.  Levelle rated their depression as 5/10 and anxiety as 3/10 (with 10 being the highest possible value). Gary [name] states their goal for today is  talk to  [employer] . Gary [name]  was  compliant with medication administration and mouth check completed. Gary [name] was  briefly  seen  in the milieu throughout the shift,  did not attend   isolative  to self ,CT of abdomen done.Vistaril 75 mg given for  anxiety .Q15 min safety checks maintained. Will continue to monitor and ensure safety.  rious activities  Outcome: Progressing  Gary [name]  did not engaged in self injurious   Goal: STG: Gary [name] will identify 3 internal coping activities strategies  Outcome: Progressing  Gary [name]  identify   walking around ,positive self thoughts ,calming self as internal coping activities  Goal: STG: "to get help"  Outcome: Progressing  Gary [name] report  seeking help out side hospital.

## 2022-11-09 NOTE — Consults (Signed)
[facility] Consult Brief Note:    Pt is already admitted to a behavioral health unit and has been screened positive for alcohol use disorder, does not need an SBIRT screening consult from [facility]. Closing out consult at this time in order to prevent duplication of services and double billing.    Treatment team CM is already working on [employer] for pt.    For medication/withdrawal questions, contact [facility] NP [phone] or [phone]) or [facility] physician on call [phone]).    Thank you, and please feel free to reach out with any specific questions or concerns. Secure chat preferred.    Theressa Stamps, [facility]  [facility] Consult Counselor  Behavioral Health Therapist III  [facility]: [phone]  **Also available via Epic Secure Chat**

## 2022-11-09 NOTE — Consults (Addendum)
Attending Attestation:     I have seen and personally examined the patient.  I agree with the findings and exam as documented by [facility] with the following caveats/additions:    Assessment/Plan:  Abd pain: CT A/P 6/26 with 1.6cm right-sided nephrolithiasis. Repeat CT A/P today with nephrolithiasis again - urology consulted. F/u CBC, CMP.    2.   Depression/Anxiety/SI: per psychiatry.    3.   EtOH abuse: per psychiatry.    4.   GERD: continue PPI.      Rubin Payor, MD, [facility]  [facility] Adult Hospitalist, [facility]  [facility] [place], Pager [phone], [employer]  [phone]  [facility] Department of Medicine, [phone]

## 2022-11-09 NOTE — Progress Notes (Signed)
Psychiatry Progress Note  (Level 1 = Problem Focused, Level 2 = Expanded Problem Focused, Level 3 = Detailed)    Patient Name: Gary [name].            Current Date/Time:  7/23/20249:08 AM  MRN:  [id number]                            Attending Physician: Farris Has, MD  DOB: 08-Feb-1981                                Gender: male                              I. History   Informants: Patient, medical record, treatment team   A. Chief Complaint or Reason for Admission       Admitted for worsening depression and suicidal ideation.    B.History of Present Illness: Interval History     (Symptoms and qualifiers:1 for Level 1, 2 for Level 2 and 3+ for Level 3)  Patient is a 42 y.o. divorced, un-domiciled male with history of depression, anxiety, and alcohol use disorder admitted for worsening depression and suicidal ideation with a plan to jump off a bridge. Patient was evaluated by the [facility] and is currently admitted voluntarily.     November 09, 2022  Patient was rounded early morning with attending.    Sleep: good  Mood: "it is okay"    Anxiety/depression:  - anxiety is 7/0 (10 being the worst). Reported his usual high is around 9/10.   - pt reported he is thinking about "the usual" which is regarding sobriety, relationships with family, housing.   - reported ongoing depression. Reported he is not beating himself up. Thinking about "everything I lost, and everything I went through    Work:   - pt reported he feels like he can find another job. Reported he enjoys working, and gives him a sense of purpose.  - reported he is often thinking about the previous job he lost  - reported getting clothing donation, including a buttoned shirt for work-interview      Alcohol cravings:  - "nothing has helped" . Reported ongoing cravings.  -historically, pt reported his alcohol cravings is not something he paid attention to. 2019 he was on naltrexone and it did not help[ him  - during 4 months of sobriety, pt reported he  possibly had craving    SI:  denied    Pending:  - interview with [facility] interview    Addendum: Conversation with Chaplain:  -apparently,  pt had informed chaplain that pt feels suicidal, and he had been telling clinical team that he was not feeling due to feeling like team has to discharge him anyway.      C.Medical History  Review of Systems  (Level 1 is none, Level 2 is 1, and Level 3 is 2+)  A complete 14 point ROS was done and was negative except for    Psychiatric: Depression, Anxiety,   Constitutional: craving     D.Additional Past Psychiatric, Substance Use, Medical, Family and Social History  Psychiatric: No new information available as compared to previous encounter(s)  Substance Use: No new information available as compared to previous encounter(s)  Medical: No new information available as compared to previous encounter(s)  Family: No  new information available as compared to previous encounter(s)  Social: No new information available as compared to previous encounter(s)    Medications:   Current Medications       Scheduled       Medication Ordered Dose/Rate, Route, Frequency Last Action    acamprosate (CAMPRAL) tablet 666 mg    666 mg, PO, TID Given, 666 mg at 07/23 3875    folic acid (FOLVITE) tablet 1 mg    1 mg, PO, Daily Given, 1 mg at 07/23 0842    gabapentin (NEURONTIN) capsule 400 mg    400 mg, PO, Q8H SCH Given, 400 mg at 07/23 0842    mirtazapine (REMERON) tablet 45 mg    45 mg, PO, QHS Given, 45 mg at 07/22 2217    pantoprazole (PROTONIX) EC tablet 40 mg    40 mg, PO, QAM AC Given, 40 mg at 07/23 6433    polyethylene glycol (MIRALAX) packet 17 g    17 g, PO, Daily Given, 17 g at 07/20 2951    thiamine (VITAMIN B1) tablet 100 mg    100 mg, PO, Daily Given, 100 mg at 07/23 0842    vitamins/minerals tablet 1 tablet    1 tablet, PO, Daily Given, 1 tablet at 07/23 0842              PRN       Medication Ordered Dose/Rate, Route, Frequency Last Action    acetaminophen (TYLENOL) tablet 650 mg    650 mg,  PO, Q6H PRN Ordered    alum & mag hydroxide-simethicone (MAALOX PLUS) 200-200-20 mg/5 mL suspension 30 mL    30 mL, PO, Q6H PRN Ordered    diphenhydrAMINE (BENADRYL) capsule 50 mg (Or Linked Group #1)    50 mg, PO, Once PRN Ordered    diphenhydrAMINE (BENADRYL) injection 50 mg (Or Linked Group #1)    50 mg, IM, Once PRN Ordered    hydrOXYzine (VISTARIL) capsule 75 mg    75 mg, PO, Q4H PRN Given, 75 mg at 07/21 1315    ibuprofen (ADVIL) tablet 800 mg    800 mg, PO, Q6H PRN Given, 800 mg at 07/22 1852    QUEtiapine (SEROquel) tablet 25 mg    25 mg, PO, BID PRN Ordered                    II. Examination   Vital signs reviewed:   Blood pressure 104/67, pulse 69, temperature 97.7 F (36.5 C), temperature source Oral, resp. rate 18, height 1.702 m (5\' 7" ), weight 68 kg (150 lb), SpO2 97%.     Mental Status Exam  (Level 1 is 1-5, Level 2 is 6-8, Level 3 is 9+)  General appearance: Appears chronological age, Good hygiene, and Good grooming  Attitude/Behavior: Cooperative, Eye Contact is  Good, and calm  Motor: No abnormalities noted  Gait: No obvious abnormalities  Muscle strength and tone: grossly intact  Speech:   Spontaneous: Yes  Rate and Rhythm: Normal  Volume: Normal  Tone: Normal  Clarity: Yes  Mood:"okay"  Affect:  Dysthymic    Range: Constricted  Stability: Stable  Appropriateness to thought content: Yes  Intensity: Normal  Thought Process:     Linear    Coherent: Yes  Logical:  Yes  Associations: Goal-directed  Thought Content:   Delusions:  Not elicited  Depressive Cognitions:   Worthless, and Guilt, some hope expressed  Suicidal:  Passive Suicidal thoughts  Homicidal:  No homicidal  thoughts  Violent Thoughts:  No  Perceptions:   Hallucinations: No  Insight: Fair  Judgment: Fair  Cognition:   Level of Consciousness: Intact  Orientation: Intact to self, place and time    Psychiatric / Cognitive Instruments: None    Pertinent Physical Exam: Not relevant to current chief complaint/reason for admission    Imaging /  EKG / Labs: Labs in the last 24 hours  Results       ** No results found for the last 24 hours. **          EKG Results   Cardiology Results       Procedure Component Value Units Date/Time    ECG 12 lead (Electrocardiogram) [[id number]] Collected: 10/28/22 1740     Updated: 10/28/22 1753     Ventricular Rate 103 BPM      Atrial Rate 103 BPM      P-R Interval 148 ms      QRS Duration 98 ms      Q-T Interval 334 ms      QTC Calculation (Bezet) 437 ms      P Axis 47 degrees      R Axis 49 degrees      T Axis 37 degrees      IHS MUSE NARRATIVE AND IMPRESSION --     SINUS TACHYCARDIA  OTHERWISE NORMAL ECG  NO PREVIOUS ECGS AVAILABLE  Confirmed by Neomia Dear MD, Irl ([id number]) on 10/28/2022 5:53:08 PM      Narrative:      SINUS TACHYCARDIA  OTHERWISE NORMAL ECG  NO PREVIOUS ECGS AVAILABLE  Confirmed by Neomia Dear MD, Mahin ([id number]) on 10/28/2022 5:53:08 PM          Brain Scan No results found for any visits on 10/28/22.    III. Assessment and Plan (Medical Decision Making)     1. I certify that this patient continues to require inpatient hospitalization due to acute risk to self and unable to care for self in the community with insufficient support available    2. Psychiatric Diagnoses  Depression, unspecified  Rule out MDD vs substance-induced mood disorder  Social Anxiety Disorder  Alcohol Use Disorder  Rule out PTSD    3. Additional Diagnoses    4.All diagnostic procedures completed since admission were reviewed. Past medical records reviewed. Coordination of care was discussed with inpatient team and as available with the outpatient team.    5. On this admission patient educated about and provided input into their treatment plan.  Patient understands potential risks and benefits of proposed treatment plan.     6.  Assessment / Impression  Patient is a 42 y.o. divorced, un-domiciled male with history of depression, anxiety, and alcohol use disorder admitted for worsening depression and suicidal ideation with a plan to jump off a bridge.  Patient was evaluated by the [facility] and is currently admitted voluntarily.     7/21:   The patient continues to report depressed mood and passive SI, reporting that he cannot identify a reason to live or to remain sober.  He reports minimal improvement in anxiety so far on gabapentin and reports 8/10 depression.  Ongoing cravings for alcohol.  Patient requires inpatient hospitalization for medication adjustment and safety.      7/22:   continues to report anxious and depressive cognitions , and struggles with alcohol craving. Reported getting relief at moments of taking gabapentin. Today is the first time patient denied struggles with SI. Continue current med dosing; gabapentin was increased  yesterday  -pending interview with [facility] (expected this week)    7/23: Patient feeling depressed and hopeless. Apparently, pt was seen by Chaplain for spiritual care, and pt had informed chaplain that he feels like he is being rushed out for discharge, therefore he had been hiding SI when in fact he was feeling  suicidal. Pt would need to be reassured tomorrow in the context of his own struggles.   - [facility]: pt does not sound hopeful about it, but he can have an appt with [facility] that patient can attend post discharge and make a decision about  - SI:  ongoing  - Nephrolithiasis: Internal Medicine consult note appreciated. Pending Urology consult, and repeat CT .       Suicide Risk Assessment  - denied SI at this time    Plan / Recommendations:  Biological Plan:  Medications:   Mirtazapine 45 mg qhs  Acamprosate 666 mg TID  gabapentin 400 mg TID (increased on 7/21)  PRN  vistaril 75 mg q4 hours for anxiety  Scheduled Meds:  Current Facility-Administered Medications   Medication Dose Route Frequency    acamprosate  666 mg Oral TID    folic acid  1 mg Oral Daily    gabapentin  400 mg Oral Q8H SCH    mirtazapine  45 mg Oral QHS    pantoprazole  40 mg Oral QAM AC    polyethylene glycol  17 g Oral Daily    thiamine   100 mg Oral Daily    vitamins/minerals  1 tablet Oral Daily     Continuous Infusions:  PRN Meds:.acetaminophen, alum & mag hydroxide-simethicone, diphenhydrAMINE **OR** diphenhydrAMINE, hydrOXYzine, ibuprofen, QUEtiapine    Medical Work-up:  as per unit FNP  Consults:  as per unit FNP    Psychosocial Plan:  Individual therapy: Psycho-education and psychotherapy of the following modalities will continue to be provided during daily visits:Supportive, Cognitive Behavioral Therapy, and Insight Oriented Psychotherapy  Group and milieu therapies: daily per unit's schedule.  Continue with Social Work intervention provided for discharge planning including assistance with placement and follow-up psychiatric care.    Plan for Family Involvement: None Available  Other Providers Contact Information and Dates Contacted: No Updates    I personally saw and examined the patient and spent 29 minutes.     Details of Time Spent:   Direct Patient Contact: Conducted psychiatric exam, assessment, and evaluation.   Coordinating Care: Coordinated care plans with clinical team  Counseling: Provided counseling to the patient regarding treatment options, management of symptoms, and lifestyle modifications.     Case discussed with attending Dr Merlinda Frederick, and treatment plan reviewed together.    Signed by: Cecille Rubin, NP  11/09/2022  3:39 PM

## 2022-11-10 DIAGNOSIS — R1011 Right upper quadrant pain: Secondary | ICD-10-CM

## 2022-11-10 LAB — LAB USE ONLY - URINALYSIS WITH MICROSCOPIC EXAM
Urine Bilirubin: NEGATIVE
Urine Blood: NEGATIVE
Urine Glucose: NEGATIVE
Urine Ketones: NEGATIVE mg/dL
Urine Leukocyte Esterase: NEGATIVE
Urine Nitrite: NEGATIVE
Urine Protein: NEGATIVE
Urine Specific Gravity: 1.02 (ref 1.001–1.035)
Urine Urobilinogen: NORMAL mg/dL (ref 0.2–2.0)
Urine pH: 6.5 (ref 5.0–8.0)

## 2022-11-10 LAB — HEPATITIS PANEL, ACUTE
Hepatitis A Antibody, IgM: NONREACTIVE
Hepatitis B Core IgM: NONREACTIVE
Hepatitis B Surface Antigen: NONREACTIVE
Hepatitis C Antibody: NONREACTIVE

## 2022-11-10 LAB — LIPASE: Lipase: 35 U/L (ref 8–78)

## 2022-11-10 LAB — LAB USE ONLY - GOLD SST HOLD TUBE

## 2022-11-10 MED ORDER — BUPROPION HCL ER (XL) 150 MG PO TB24
150.0000 mg | ORAL_TABLET | Freq: Every day | ORAL | Status: DC
Start: 2022-11-10 — End: 2022-11-17
  Administered 2022-11-10 – 2022-11-17 (×8): 150 mg via ORAL
  Filled 2022-11-10 (×9): qty 1

## 2022-11-10 MED ORDER — NALTREXONE HCL 50 MG PO TABS
25.0000 mg | ORAL_TABLET | Freq: Every day | ORAL | Status: DC
Start: 2022-11-11 — End: 2022-11-11
  Administered 2022-11-11: 25 mg via ORAL
  Filled 2022-11-10 (×2): qty 1

## 2022-11-10 NOTE — Group Note (Signed)
Group: BH - Creative  Group Topics: Expressive   Group Date: 11/10/2022  Start Time: 1500  End Time: 1600  Number of Participants: 5  Facilitators: Tonette Lederer T   Department: [facility] [address]    Discipline Led: BHT  Number of Patient Participants: 5      Name: Saba Neuman. Date of Birth: 1980/12/11   MR: [id number]      Level of Participation: when cued  Quality of Participation: cooperative  Interactions with Others: gave feedback  Mood/Affect: flat  Triggers (if applicable): N/A  Cognition: rigid  Organization: worked independently  Progress: Minimal  Response: Pt was sitting and observing peers throughout the group.   Plan: patient will be encouraged to attends and participates in all groups.     Patients Problems:   Patient Active Problem List   Diagnosis    Suicidal ideation

## 2022-11-10 NOTE — Progress Notes (Signed)
Psychiatry Progress Note  (Level 1 = Problem Focused, Level 2 = Expanded Problem Focused, Level 3 = Detailed)    Patient Name: Gary [name].            Current Date/Time:  7/24/202412:58 PM  MRN:  [id number]                            Attending Physician: Farris Has, MD  DOB: Jul 24, 1980                                Gender: male                              I. History   Informants: Patient, medical record, treatment team   A. Chief Complaint or Reason for Admission       Admitted for worsening depression and suicidal ideation.    B.History of Present Illness: Interval History     (Symptoms and qualifiers:1 for Level 1, 2 for Level 2 and 3+ for Level 3)  Patient is a 42 y.o. divorced, un-domiciled male with history of depression, anxiety, and alcohol use disorder admitted for worsening depression and suicidal ideation with a plan to jump off a bridge. Patient was evaluated by the [facility] and is currently admitted voluntarily.     November 10, 2022  Patient expressed that he had been minimizing symptoms with treatment team in previous days. He shares that he feels "depressed and sad" today with intermittent passive SI but no active plan or intent at present. He reports that he has been trying to spend less time isolated to his room but he finds this difficult and distressing because of his social anxiety. He reports that alcohol has been his primary coping mechanism for many years and he worries about his ability to maintain his sobriety going forward.     Regarding medication management, patient reports that acamprosate has been unhelpful for managing his alcohol cravings and he expresses a desire to discontinue the medication, stating that he is unlikely to take it post-discharge anyway. He also expresses interest in potentially lowering vs. Discontinuing mirtazapine. Although he found the medication to be helpful for his sleep, he expresses concern that he may be too sedated to work if he were to continue  it. Patient also spontaneously inquires about starting wellbutrin, sharing that he has historically benefited from the medication without adverse effects.     Patient had an interview with an [facility] yesterday evening; pending outcome.     C.Medical History  Review of Systems  (Level 1 is none, Level 2 is 1, and Level 3 is 2+)  A complete 14 point ROS was done and was negative except for    Psychiatric: Depression, Anxiety,   Constitutional: craving for alcohol     D.Additional Past Psychiatric, Substance Use, Medical, Family and Social History  Psychiatric: No new information available as compared to previous encounter(s)  Substance Use: No new information available as compared to previous encounter(s)  Medical: No new information available as compared to previous encounter(s)  Family: No new information available as compared to previous encounter(s)  Social: No new information available as compared to previous encounter(s)    Medications:   Current Medications       Scheduled       Medication Ordered Dose/Rate,  Route, Frequency Last Action    buPROPion XL (WELLBUTRIN XL) 24 hr tablet 150 mg    150 mg, PO, Daily Ordered    folic acid (FOLVITE) tablet 1 mg    1 mg, PO, Daily Given, 1 mg at 07/24 0924    gabapentin (NEURONTIN) capsule 400 mg    400 mg, PO, Q8H SCH Given, 400 mg at 07/24 0830    mirtazapine (REMERON) tablet 45 mg    45 mg, PO, QHS Given, 45 mg at 07/23 2139    pantoprazole (PROTONIX) EC tablet 40 mg    40 mg, PO, QAM AC Given, 40 mg at 07/24 0800    polyethylene glycol (MIRALAX) packet 17 g    17 g, PO, Daily Given, 17 g at 07/24 2376    thiamine (VITAMIN B1) tablet 100 mg    100 mg, PO, Daily Given, 100 mg at 07/24 0924    vitamins/minerals tablet 1 tablet    1 tablet, PO, Daily Given, 1 tablet at 07/24 0923              PRN       Medication Ordered Dose/Rate, Route, Frequency Last Action    acetaminophen (TYLENOL) tablet 650 mg    650 mg, PO, Q6H PRN Given, 650 mg at 07/23 2203    alum & mag  hydroxide-simethicone (MAALOX PLUS) 200-200-20 mg/5 mL suspension 30 mL    30 mL, PO, Q6H PRN Ordered    diphenhydrAMINE (BENADRYL) capsule 50 mg (Or Linked Group #1)    50 mg, PO, Once PRN Ordered    diphenhydrAMINE (BENADRYL) injection 50 mg (Or Linked Group #1)    50 mg, IM, Once PRN Ordered    hydrOXYzine (VISTARIL) capsule 75 mg    75 mg, PO, Q4H PRN Given, 75 mg at 07/23 1246    ibuprofen (ADVIL) tablet 800 mg    800 mg, PO, Q6H PRN Given, 800 mg at 07/24 1050    QUEtiapine (SEROquel) tablet 25 mg    25 mg, PO, BID PRN Ordered                    II. Examination   Vital signs reviewed:   Blood pressure 114/70, pulse 75, temperature 97.5 F (36.4 C), temperature source Oral, resp. rate 17, height 1.702 m (5\' 7" ), weight 68 kg (150 lb), SpO2 97%.     Mental Status Exam  (Level 1 is 1-5, Level 2 is 6-8, Level 3 is 9+)  General appearance: Appears chronological age, Good hygiene, and Good grooming  Attitude/Behavior: Cooperative, Eye Contact is  Good, and calm  Motor: No abnormalities noted  Gait: No obvious abnormalities  Muscle strength and tone: grossly intact  Speech:   Spontaneous: Yes  Rate and Rhythm: Normal  Volume: Normal  Tone: Normal  Clarity: Yes  Mood:"okay"  Affect:  Dysthymic    Range: Constricted  Stability: Stable  Appropriateness to thought content: Yes  Intensity: Normal  Thought Process:     Linear    Coherent: Yes  Logical:  Yes  Associations: Goal-directed  Thought Content:   Delusions:  Not elicited  Depressive Cognitions:   Worthless, and Guilt, some hope expressed  Suicidal:  Passive Suicidal thoughts  Homicidal:  No homicidal thoughts  Violent Thoughts:  No  Perceptions:   Hallucinations: No  Insight: Fair  Judgment: Fair  Cognition:   Level of Consciousness: Intact  Orientation: Intact to self, place and time    Psychiatric /  Cognitive Instruments: None    Pertinent Physical Exam: Not relevant to current chief complaint/reason for admission    Imaging / EKG / Labs: Labs in the last 24  hours  Results       Procedure Component Value Units Date/Time    Hepatitis Panel, Acute (Order) [[id number]] Collected: 11/09/22 1606    Specimen: Blood, Venous Updated: 11/10/22 1227    Narrative:      The following orders were created for panel order Hepatitis Panel, Acute (Order).  Procedure                               Abnormality         Status                     ---------                               -----------         ------                     Hepatitis Panel, Acute (.Marland KitchenMarland Kitchen[[id number]]                      In process                 Erin [employer] Hold WNUU[[id number]]                               In process                   Please view results for these tests on the individual orders.    Hepatitis Panel, Acute (Component) [[id number]] Collected: 11/09/22 1606    Specimen: Blood, Venous Updated: 11/10/22 1227    Gold SST Hold Tube [[id number]] Collected: 11/09/22 1606    Specimen: Blood, Venous Updated: 11/10/22 1227    Lipase [[id number]] Collected: 11/09/22 1606    Specimen: Blood, Venous Updated: 11/10/22 1227    Comprehensive Metabolic Panel [[id number]]  (Abnormal) Collected: 11/09/22 1606    Specimen: Blood, Venous Updated: 11/09/22 2129     Glucose 119 mg/dL      BUN 16 mg/dL      Creatinine 1.1 mg/dL      Sodium 932 mEq/L      Potassium 4.4 mEq/L      Chloride 105 mEq/L      CO2 27 mEq/L      Calcium 9.1 mg/dL      Anion Gap 35.5     GFR >60.0 mL/min/1.73 m2      AST (SGOT) 51 U/L      ALT 102 U/L      Alkaline Phosphatase 81 U/L      Albumin 4.0 g/dL      Protein, Total 7.3 g/dL      Globulin 3.3 g/dL      Albumin/Globulin Ratio 1.2     Bilirubin, Total 0.2 mg/dL      Hemolysis Index 6 Index     CBC without Differential [[id number]]  (Abnormal) Collected: 11/09/22 1606    Specimen: Blood, Venous Updated: 11/09/22 2124     WBC 8.22 x10 3/uL      Hemoglobin 14.2 g/dL      Hematocrit 70.6 %  Platelet Count 311 x10 3/uL      MPV 10.2 fL      RBC 4.54 x10 6/uL      MCV 95.4 fL      MCH 31.3 pg      MCHC 32.8  g/dL      RDW 16 %      nRBC % 0.0 /100 WBC      Absolute nRBC 0.00 x10 3/uL           EKG Results   Cardiology Results       Procedure Component Value Units Date/Time    ECG 12 lead (Electrocardiogram) [[id number]] Collected: 10/28/22 1740     Updated: 10/28/22 1753     Ventricular Rate 103 BPM      Atrial Rate 103 BPM      P-R Interval 148 ms      QRS Duration 98 ms      Q-T Interval 334 ms      QTC Calculation (Bezet) 437 ms      P Axis 47 degrees      R Axis 49 degrees      T Axis 37 degrees      IHS MUSE NARRATIVE AND IMPRESSION --     SINUS TACHYCARDIA  OTHERWISE NORMAL ECG  NO PREVIOUS ECGS AVAILABLE  Confirmed by Neomia Dear MD, [place] ([id number]) on 10/28/2022 5:53:08 PM      Narrative:      SINUS TACHYCARDIA  OTHERWISE NORMAL ECG  NO PREVIOUS ECGS AVAILABLE  Confirmed by Neomia Dear MD, [place] ([id number]) on 10/28/2022 5:53:08 PM          Brain Scan No results found for any visits on 10/28/22.    III. Assessment and Plan (Medical Decision Making)     1. I certify that this patient continues to require inpatient hospitalization due to acute risk to self and unable to care for self in the community with insufficient support available    2. Psychiatric Diagnoses  Depression, unspecified  Rule out MDD vs substance-induced mood disorder  Social Anxiety Disorder  Alcohol Use Disorder  Rule out PTSD    3. Additional Diagnoses    4.All diagnostic procedures completed since admission were reviewed. Past medical records reviewed. Coordination of care was discussed with inpatient team and as available with the outpatient team.    5. On this admission patient educated about and provided input into their treatment plan.  Patient understands potential risks and benefits of proposed treatment plan.     6.  Assessment / Impression  Patient is a 42 y.o. divorced, un-domiciled male with history of depression, anxiety, and alcohol use disorder admitted for worsening depression and suicidal ideation with a plan to jump off a bridge. Patient was evaluated  by the [facility] and is currently admitted voluntarily.     7/21:   The patient continues to report depressed mood and passive SI, reporting that he cannot identify a reason to live or to remain sober.  He reports minimal improvement in anxiety so far on gabapentin and reports 8/10 depression.  Ongoing cravings for alcohol.  Patient requires inpatient hospitalization for medication adjustment and safety.      7/22:   continues to report anxious and depressive cognitions , and struggles with alcohol craving. Reported getting relief at moments of taking gabapentin. Today is the first time patient denied struggles with SI. Continue current med dosing; gabapentin was increased yesterday  -pending interview with [employer] (expected this week)  7/23: Patient feeling depressed and hopeless. Apparently, pt was seen by Chaplain for spiritual care, and pt had informed chaplain that he feels like he is being rushed out for discharge, therefore he had been hiding SI when in fact he was feeling  suicidal. Pt would need to be reassured tomorrow in the context of his own struggles.   - [facility]: pt does not sound hopeful about it, but he can have an appt with [facility] that patient can attend post discharge and make a decision about  - SI:  ongoing  - Nephrolithiasis: Internal Medicine consult note appreciated. Pending Urology consult, and repeat CT .     7/24: Patient expressed that he had been minimizing the extent of his depression and suicidality in recent days but he is more forthcoming today. He reports that his mood is low and he continues to endorse passive suicidal thoughts. He is tolerating his current medication regimen but reports minimal benefit and expresses interest in changes. Will discontinue Acamprosate as patient has found this medication unhelpful and shares that he is unlikely to continue post-discharge. Will start wellbutrin XL 150 mg daily to target depression; patient reported historical benefit  from wellbutrin without adverse effects. Will monitor closely for tolerability.     Attending physician discussed initiating a re-trial of naltrexone for alcohol use disorder and he agreed to the trial. Will start naltrexone 25 mg daily starting tomorrow.     Suicide Risk Assessment  - Passive SI; no active plan nor intent     Plan / Recommendations:  Biological Plan:  Medications:   Mirtazapine 45 mg qhs  Discontinue Acamprosate 666 mg TID  gabapentin 400 mg TID (increased on 7/21)  Start Wellbutrin XL 150 mg daily  Start naltrexone 25 mg tomorrow,  PRN  vistaril 75 mg q4 hours for anxiety  Scheduled Meds:  Current Facility-Administered Medications   Medication Dose Route Frequency    buPROPion XL  150 mg Oral Daily    folic acid  1 mg Oral Daily    gabapentin  400 mg Oral Q8H SCH    mirtazapine  45 mg Oral QHS    [START ON 11/11/2022] naltrexone  25 mg Oral Daily    pantoprazole  40 mg Oral QAM AC    polyethylene glycol  17 g Oral Daily    thiamine  100 mg Oral Daily    vitamins/minerals  1 tablet Oral Daily     Continuous Infusions:  PRN Meds:.acetaminophen, alum & mag hydroxide-simethicone, diphenhydrAMINE **OR** diphenhydrAMINE, hydrOXYzine, ibuprofen, QUEtiapine    Medical Work-up:  as per unit FNP  Consults: IM and Urology teams consulted and appreciated; will order UA     Psychosocial Plan:  Individual therapy: Psycho-education and psychotherapy of the following modalities will continue to be provided during daily visits:Supportive, Cognitive Behavioral Therapy, and Insight Oriented Psychotherapy  Group and milieu therapies: daily per unit's schedule.  Continue with Social Work intervention provided for discharge planning including assistance with placement and follow-up psychiatric care.    Plan for Family Involvement: None Available  Other Providers Contact Information and Dates Contacted: No Updates    I personally saw and examined the patient and spent 45 minutes on this visit reviewing previous notes,  counseling the patient and/or family members regarding the patient's condition(s), ordering of tests, managing medications, and documenting the findings in the note.   Patient was also seen by Dr. Merlinda Frederick. The assessment and plan were discussed and formulated together and has been reviewed  and updated as needed.      Signed by: Eldridge Dace, NP  11/10/2022  12:58 PM

## 2022-11-10 NOTE — Progress Notes (Addendum)
During the treatment team meeting, it was reported that the patient Gary [name] has been experiencing kidney stone pain and may require surgery, which is currently being assessed. Gary [name] has also been participating in one-on-one therapy sessions and is encouraged to be more active outside his room.    Yesterday, the SW met with the Gary [name] at bedside. The Gary [name] disclosed that he has been lying about his SI and continues to feel depressed but has been telling the treatment team what he believes they want to hear regarding his progress. The SW validated his feelings and encouraged him to be honest about his symptoms and emotions, emphasizing their commitment to helping him.    The Gary [name] successfully attended his scheduled interview with [facility] via Zoom at 3:30 PM. During the meeting, it was discussed that rent would be $800.66 per month, which includes mortgage and utilities, with an additional $10 for cable and a $400 or $500 move-in fees. Rent is due monthly, and residents are required to perform chores. Concerns were raised about his ability to pay rent with his scholarship and more information was requested in this regard. Additionally, [employer] expressed expectations for him to secure employment since the scholarship is temporary. They also expressed willingness to help him manage his sobriety, anxiety and depression, and ensuring medication adherence.    The [facility] location; [address] [place], [place] [address].     The Gary [name] shared that he had a DUI in 2022 and served 34 days in jail but has no other criminal background. During the interview, he was asked what makes this attempt at sobriety different, to which he responded that he wants to improve his relationship with his children and resume his hobby of collecting model airplanes, which brings him joy. He expressed a commitment to sobriety for his own future and well-being. He also mentioned that virtual AA meetings have been more effective for him due to  his social anxiety.    The house members outlined the rules the Gary [name] must follow accountability, sobriety, honesty, and completing assigned chores.     The Gary [name] is scheduled to meet with the members tonight at 8:00 PM to learn if he has been accepted.    The SW will complete the scholarship referral for the patient.    The SW will explore his interest in participating in virtual AA meetings as his aftercare plan.    Gary [name] was instructed to call Ruben Mahler [phone] at 8:00 PM regarding [facility] admission.    SW contacted [employer]  [phone] to request scholarship funding, however they reported they only service [facility]. SW was advised to contact [facility] [phone] who reported they do not assist with sober living.     SW also contacted [employer] [phone], and Genevieve Norlander [phone] but did not receive an answer. SW left a voicemail requesting a call back.     SW and Gary [name] completed another application for [employer] at a different location as a backup plan incase he doesn't get accepted to the one located at [address], [place], [place] [address].     SW gave Gary [name] with a list of scholarship options and encouraged him to contact them and request a referral, which he was receptive to.     Tyna Jaksch, MSW

## 2022-11-10 NOTE — Plan of Care (Signed)
Problem: Pain interferes with ability to perform ADL  Goal: Pain at adequate level as identified by patient  Description: Interventions:  1. Identify patient comfort function goal  2. Evaluate if patient comfort function goal is met  3. Assess pain on admission, during daily assessment and/or before any "as needed" intervention(s)  4. Reassess pain within 30-60 minutes of any procedure/intervention, per Pain Assessment, Intervention, Reassessment (AIR) Cycle  5. Evaluate patient's satisfaction with pain management progress  6. Offer non-pharmacological pain management interventions  7. Consult/collaborate with Pain Service  8. Consult/collaborate with Physical Therapy, Occupational Therapy, and/or Speech Therapy  9. Assess for risk of opioid induced respiratory depression and side effects, including snoring/sleep apnea. Alert healthcare team of risk factors identified.  10. Include patient/patient care companion in decisions related to pain management as needed  Outcome: Not Progressing  Gary [name] requested for ibuprofen for  pain 9/10     Problem: IP Suicidal Ideation  Description: [name] endorses suicidal ideation with plan to jump off bridge  Goal: LTG: Demarion will exhibit compliance with therapy  Outcome: Not Progressing  [name] endorsed passive SI     Problem: IP Suicidal Ideation  Description: [name] endorses suicidal ideation with plan to jump off bridge  Goal: STG: [name] will not engage in self-injurious activities  Outcome: Progressing  [name] did not engage in self-injurious activities    [name] was A+Ox4 with mood-congruent affect and WNL speech. Endorses passive SI denies HI/AVH/SIB urges. BVC is 0. Pt states they are committed to maintaining their safety, encouraged to come to staff if thoughts or urges to harm self or others increases, [name] verbalizes understanding. Pt is cooperative with Clinical research associate, able to answer questions and maintain eye contact. Pt describes their mood as depressed and anxious. Reported good  sleep/energy/appetite.  [name] rated their depression as 10/10 and anxiety as 8/10 (with 10 being the highest possible value). [name] states their goal for today is attend group therapies. [name] is compliant with medication administration and mouth check completed. [name] was visible in the milieu, participated in x1 groups with peers and staff appropriately. Q15 min safety checks maintained. Will continue to monitor and ensure safety.  PRN ibuprofen for abdominal pain      "I, Gary [name], hereby attest that I have been made aware of and reviewed the following for this patient:    - Biopsychosocial assessment;  - Diagnoses;  - Nursing needs and medical support, if applicable; and  - Treatment Plan Goals, Objectives and Strategies/Interventions.    I am involved in the care of this patient within my scope of practice and assigned tasks. I approve the treatment plan and this attestation is in lieu of a signature on the document."

## 2022-11-10 NOTE — Group Note (Addendum)
Group: BH - Wrap Up  Group Topics: Day in Review   Group Date: 11/10/2022  Start Time: 1830  End Time: 1915  Number of Participants: 7  Facilitators: Estelle Grumbles   Department: [facility] [address]    Discipline Led: BHT  Number of Patient Participants: 7      Name: Gary [name]. Date of Birth: [birth year]   MR: [id number]      Level of Participation: minimal  Quality of Participation: isolative, quiet, and withdrawn  Interactions with Others:  quiet  Mood/Affect: anxious, depressed, and flat  Triggers (if applicable): N/A  Cognition: UTA  Organization:  UTA  Progress: Patient attended 2 out of 6 groups today. Patient agreed to come to wrap up group if he did not have to talk. Patient did not talk at all in the group. Given positive feedback for coming.   Response: Patient left the group x 1. He came back and stood just inside the doorway. He did not speak at all. Given positive feedback for coming. TW checked in with patient after the group. He was sitting in the tv area with the other patients. He said he had been accepted into one [facility] and was waiting to hear from another. He said he had been very anxious in wrap up group. Patient was again given positive feedback for making progress by coming. He smiled and appeared pleased.   Plan: patient will be encouraged to continue to attend groups.     Patients Problems:   Patient Active Problem List   Diagnosis    Suicidal ideation

## 2022-11-10 NOTE — Progress Notes (Addendum)
MEDICINE CONSULT FOLLOW UP    Date Time: 11/10/22 1:46 PM  Patient Name: [name]  Requesting Physician: Farris Has, MD  Consulting Physician: Orlie Pollen, MD    Primary Care Physician: Marisa Sprinkles, MD    Reason for Consultation: Abdominal pain      Assessment:     Patient Active Problem List    Diagnosis Date Noted    Suicidal ideation 10/28/2022     42 y/o man with history of nephrolithiasis, alcohol use disorder, low back pain, and depression, admitted for management of depression and suicidal ideation, now with worsening abdominal pain and dysuria, most concerning for persistent or recurrent nephrolithiasis without signs of sepsis. Currently afebrile and hemodynamically stable.    Recommendations:   #Abdominal pain  With history of recent 1.6 cm stone, concern for persistent nephrolithiasis. Patient is still urinating well. Patient has dysuria but otherwise no indication of infection/sepsis (afebrile, no subjective chills, nausea). Low suspicion for acute surgical pathology I.e. testicular torsion, appendicitis, based on exam and history. May also be a component of constipation and gas pain contributing to his abdominal discomfort, given his distention and tympany. Still passing flatus, so low suspicion for complete obstruction  - CBC with diff, CMP, UA ordered  - Follow up CT ab/p  - If <10 mm stone present without significant obstruction, would proceed with pain management, hydration +/- alpha blocker  - If obstruction, significant AKI, or intractable pain, may require urologic intervention  - Encourage PO fluid consumption, goal UOP>2500 mL/day  - Pain management: continue acetaminophen 650 mg PRN, ibuprofen 800 mg q6h PRN   - Added oxycodone 5 mg q6h, one-time dilaudid 1 mg for breakthrough  - Can defer antibiotics pending UA  - Bowel regimen: Miralax once daily, can increase to BID, recommend adding Pericolace BID PRN  7/24: Appreciate Urology input.  I think the abdominal pain  which is mostly in the RUQ and Epigastric region with tenderness as well is probably due to alcoholic liver disease.  I've advised the patient to quit drinking which should help him.  Hep Panel is negative.  F/u Lipase but doubt pancreatitis coz the CT did not show signs of it.  LFTs are elevated, but not the typical Alcohol pattern. Will follow LFTs in 1-2 days.  May get US Liver if pain does not improve or if LFTs trending up.    #GERD  - Continue PPI    #Alcohol use disorder  - Continue acamprosate, folate, thiamine    #Low back pain  - Continue gabapentin    #Depression  - Per psychiatry team    DVT Ppx: not indicated, patient ambulating  Diet: Regular  Telemetry: Not indicated  Full code    Farris Has, MD, thank you for this consultation.  We will follow the patient with you during this hospitalization. Please contact us with any questions or issues.                History of Presenting Illness:   Gary [name]. is a 42 y.o. male with history of nephrolithiasis, low back pain, alcohol use disorder, and depression, admitted to inpatient psychiatry for management of depression with suicidal ideation. Medicine was consulted for concern of abdominal pain and nephrolithiasis.    Of note, he states that he was seen in a [facility] in June for severe abdominal pain that had him "doubled over." He was also having "fire-colored" urine. He underwent a CT ab/p 6/26, which revealed  a 1.6 cm right nephrolithiasis with moderate dilation of mid and lower pole calyces. He states that he had a discussion with a surgeon who counseled him on the risk of infection and kidney problems with a stone that size, but he could not undergo surgery at that time as he was living on the [address]. He states that he was eventually planning for stone removal in August.    Today, Gary [name] reports that over the past 2 days he has had progressively worsening right-sided abdominal pain that radiates to the middle of his  abdomen and to his back. He states that since his hospitalization in June he has had constant pain that has been relatively tolerable but it was always contained to the right side. The pain worsens with stretching and urinating. It is currently an 8/10, but improves to a 6/10 with Motrin. He was able to sleep through the night. He also states that his abdomen appears larger today than usual, and it feels full. He reports 1 week of dysuria; he is still producing his usual amount of urine and it is "crystal clear." States that he still has an appetite and has good PO intake. Denies testicular/scrotal pain. He also notes that he has been somewhat constipated. He typically has 1 soft bowel movement per day but for the past several days has had 2-3 hard, pellet-like bowel movements per day. States that Miralax is not helping. Denies fevers/chills, nausea, vomiting, diarrhea.    He denies history of other medical problems. No history of abdominal surgery. No family history of nephrolithiasis. He does not currently have housing or outpatient medical care.      Past Medical History:     Past Medical History:   Diagnosis Date    Anxiety     Depression        Available old records reviewed, including:  collateral from Epic, Care everywhere including CT ab/p 6/26    Past Surgical History:   History reviewed. No pertinent surgical history.    Family History:   History reviewed. No pertinent family history.    Social History:   Tobacco Use History[1]  Social History     Substance and Sexual Activity   Alcohol Use Yes    Comment: daily- "until I pass out" " Yesterday I had 3 pints of vodka" 10/28/2022     Social History     Substance and Sexual Activity   Drug Use Never       Allergies:   Allergies[2]    Medications:     Home Medications       Med List Status: Complete Set By: Katrinka Blazing, RN at 10/29/2022 12:49 AM              ibuprofen (ADVIL) 800 MG tablet     Take 1 tablet (800 mg) by mouth 3 (three) times daily as needed      omeprazole (PriLOSEC) 40 MG capsule     Take 1 capsule (40 mg) by mouth daily            Current Facility-Administered Medications   Medication Dose Route Frequency    buPROPion XL  150 mg Oral Daily    folic acid  1 mg Oral Daily    gabapentin  400 mg Oral Q8H SCH    mirtazapine  45 mg Oral QHS    [START ON 11/11/2022] naltrexone  25 mg Oral Daily    pantoprazole  40 mg Oral QAM AC  polyethylene glycol  17 g Oral Daily    thiamine  100 mg Oral Daily    vitamins/minerals  1 tablet Oral Daily            Review of Systems:   All other systems were reviewed and are negative unless noted in HPI.    Physical Exam:   Patient Vitals for the past 24 hrs:   BP Temp Temp src Pulse Resp SpO2   11/10/22 0754 114/70 97.5 F (36.4 C) Oral 75 17 97 %   11/09/22 1946 127/85 99 F (37.2 C) Oral 91 18 96 %   11/09/22 1505 126/90 99.7 F (37.6 C) Oral (!) 103 -- 96 %     Body mass index is 23.49 kg/m.  No intake or output data in the 24 hours ending 11/10/22 1346    General: awake, alert, oriented x 3; walking around room in no distress, well-appearing  Cardiovascular: normal rate and regular rhythm, no murmurs appreciated  Lungs: normal effort on room air, good bibasilar air entry, no wheezing, rhonchi, or rales  Abdomen: Normal bowel sounds. RUQ tenderness, no masses or hernias, no rebound or guarding.  Extremities: no edema  Neuro: no lateralizing deficits  Skin: no rashes or lesions noted    Labs:     Recent Labs     11/09/22  1606   WBC 8.22   Hemoglobin 14.2   Hematocrit 43.3   Platelet Count 311       Recent Labs     11/09/22  1606   Sodium 142   Potassium 4.4   Chloride 105   CO2 27   BUN 16   Creatinine 1.1   Glucose 119*   Calcium 9.1       Recent Labs     11/09/22  1606   AST (SGOT) 51*   ALT 102*   Alkaline Phosphatase 81   Protein, Total 7.3   Albumin 4.0       No results for input(s): "PTT", "PT", "INR" in the last 72 hours.    Imaging personally reviewed, including:   CT Abdomen Pelvis WO IV/ WO PO  Cont    Result Date: 11/09/2022  1. Nonobstructing right nephrolithiasis. 2. Bilateral renal cysts. Georgiana Spinner, MD 11/09/2022 12:48 PM         Orlie Pollen, MD  11/10/2022  1:46 PM      cc: Farris Has, MD  Pcp, None, MD         [1]   Social History  Tobacco Use   Smoking Status Never   Smokeless Tobacco Never   [2]   Allergies  Allergen Reactions    Abilify [Aripiprazole]     Effexor [Venlafaxine]     Haldol [Haloperidol]     Lithium     Paxil [Paroxetine]     Prozac [Fluoxetine]     Vivitrol [Naltrexone]

## 2022-11-10 NOTE — Progress Notes (Addendum)
Facilitators:  Derry Skill, OT, OTR/L  Department: [facility]    Patient Name: Gary [name].    MRN:  [id number]  Age: 42 y.o.  DOB: [birth year]   Unit: [facility] [address]    Bed: P639/P639.01        Group Focus:  Leisure .  Treatment Modality: Patient-Centered Therapy and Psychoeducation  Interventions utilized were exploration, patient education, and problem solving  Purpose: increase insight or knowledge    Topic: Leisure education and participation        Intervention:  Pt transitioned independently to/from group session. Pt engaged appropriately in orientation to group and icebreaker activity, and reported absolutely nothing as something that they are looking forward to. For the warm-up activity, the group participated in straw breathing technique for increased calm. OT facilitated group discussion on the types of leisure, barriers, and benefits of incorporating leisure activities into a daily routine. Pt transitioned to explore leisure through creating and personalizing a Flextangle or creating a tissue paper mosaic using a template. Pt engaged minimally with peers. Overall, patient's group participation was good. Continued occupational therapy services are recommended to maximize illness management skills and strategies.      Level of Participation: withdrawn, minimal  Quality of Participation: quiet and withdrawn  Interactions with others:  minimal, quiet  Mood/Affect: anxious and depressed  Cognition: UTA  Progress: First encounter  Following Commands: independent  Gait/movement: steady and coordinated  Response: Pt invited to group by Writer and amenable to going. Pt's speech soft, looking mostly outward and down. Pt interacted with Clinical research associate but not peers. Pt responded to icebreaker but did not engage in warm-up activity. When given a choice, pt did not chose to work on any of the leisure activities. Pt sat quietly at table for ~10 minutes  before finishing coffee, throwing cup away, and exiting room. Pt to continue to be encouraged to attend groups.       Plan: Give OT screen.        Time:OT Received On: 11/10/22  Start Time: 1015  Stop Time: 1100  Time Calculation (min): 45 min    Signed By: Derry Skill, OT, OTR/L

## 2022-11-10 NOTE — Plan of Care (Signed)
Problem: IP Suicidal Ideation  Description: Gary [name] endorses suicidal ideation with plan to jump off bridge  Goal: LTG: Gary [name] will exhibit compliance with therapy.  Outcome: Not Progressing  Patient remains nonadherent to group participation, however patient is complaint with medication regimen.     Problem: IP Suicidal Ideation  Description: Gary [name] endorses suicidal ideation with plan to jump off bridge.  Goal: STG: Gary [name] will not engage in self-injurious activities.  Outcome: Progressing  Patient remained safe and free of self-injurious behavior throughout the shift.     Gary [name] was alert and oriented times four and presented with appropriate speech; a depressed and anxious mood; mood-congruent affect; and a cooperative, attitude during interactions with this Clinical research associate. On approach, Gary [name] could be witnessed lying in bed, reading a book, with no distress noted. Patient remained isolative to himself and room for the entirety of shift, but was able to make his needs known. During assessment, patient described his mood as "okay," and continued to endorse severe depression and anxiety, passive suicidal ideations and pain. Patient was encouraged to report any worsening feelings of depression/anxiety, persistent thoughts of self harm/harm to others, and overall concerns to this Clinical research associate and staff; patient verbalized understanding. Gary [name] did deny feelings/thoughts of homicidal ideations and audiovisual hallucinations. Patient received PRN Gary [name] 650 mg for flank pain, which he rated an 8/10. Patient also received PRN Gary [name] 75 mg for increased anxiety; medication had adequate effect. Gary [name] was medication compliant and mouth check was conducted after administration. Patient reported no issues with appetite, sleep and toileting habits. Overall, no major behavioral issues to report.      Q15 minute checks and hourly RN rounding for patient safety maintained, and plan of care continues.      Patient was witnessed resting for  approximately 7 hours as of 0600.

## 2022-11-11 LAB — LAB USE ONLY - URINE GRAY CULTURE HOLD TUBE

## 2022-11-11 MED ORDER — HYDROXYZINE PAMOATE 25 MG PO CAPS
100.0000 mg | ORAL_CAPSULE | ORAL | Status: DC | PRN
Start: 2022-11-11 — End: 2022-11-11

## 2022-11-11 MED ORDER — HYDROXYZINE PAMOATE 25 MG PO CAPS
100.0000 mg | ORAL_CAPSULE | Freq: Four times a day (QID) | ORAL | Status: DC | PRN
Start: 2022-11-11 — End: 2022-11-17
  Administered 2022-11-12 – 2022-11-17 (×10): 100 mg via ORAL
  Filled 2022-11-11 (×10): qty 4

## 2022-11-11 MED ORDER — NALTREXONE HCL 50 MG PO TABS
50.0000 mg | ORAL_TABLET | Freq: Every day | ORAL | Status: DC
Start: 2022-11-12 — End: 2022-11-15
  Administered 2022-11-12 – 2022-11-14 (×3): 50 mg via ORAL
  Filled 2022-11-11 (×5): qty 1

## 2022-11-11 NOTE — Progress Notes (Signed)
MEDICINE CONSULT FOLLOW UP    Date Time: 11/11/22 2:27 PM  Patient Name: [name]  Requesting Physician: Farris Has, MD  Consulting Physician: Orlie Pollen, MD    Primary Care Physician: Marisa Sprinkles, MD    Reason for Consultation: Abdominal pain      Assessment:     Patient Active Problem List    Diagnosis Date Noted    Suicidal ideation 10/28/2022     42 y/o man with history of nephrolithiasis, alcohol use disorder, low back pain, and depression, admitted for management of depression and suicidal ideation, now with worsening abdominal pain and dysuria, most concerning for persistent or recurrent nephrolithiasis without signs of sepsis. Currently afebrile and hemodynamically stable.    Recommendations:   #Abdominal pain  With history of recent 1.6 cm stone, concern for persistent nephrolithiasis. Patient is still urinating well. Patient has dysuria but otherwise no indication of infection/sepsis (afebrile, no subjective chills, nausea). Low suspicion for acute surgical pathology I.e. testicular torsion, appendicitis, based on exam and history. May also be a component of constipation and gas pain contributing to his abdominal discomfort, given his distention and tympany. Still passing flatus, so low suspicion for complete obstruction  - CBC with diff, CMP, UA ordered  - Follow up CT ab/p  - If <10 mm stone present without significant obstruction, would proceed with pain management, hydration +/- alpha blocker  - If obstruction, significant AKI, or intractable pain, may require urologic intervention  - Encourage PO fluid consumption, goal UOP>2500 mL/day  - Pain management: continue acetaminophen 650 mg PRN, ibuprofen 800 mg q6h PRN   - Added oxycodone 5 mg q6h, one-time dilaudid 1 mg for breakthrough  - Can defer antibiotics pending UA  - Bowel regimen: Miralax once daily, can increase to BID, recommend adding Pericolace BID PRN  7/24: Appreciate Urology input.  I think the abdominal pain  which is mostly in the RUQ and Epigastric region with tenderness as well is probably due to alcoholic liver disease.  I've advised the patient to quit drinking which should help him.  Hep Panel is negative.  F/u Lipase but doubt pancreatitis coz the CT did not show signs of it.  LFTs are elevated, but not the typical Alcohol pattern. Will follow LFTs in 1-2 days.  May get US Liver if pain does not improve or if LFTs trending up.  7/25: Still c/o 9-10/10 Abdominal pain. Pain is out of proportion to signs and symptoms.  Will get a RUQ [place]. F/u LFTs in AM.  I told him to limit the amout of ibuprofen he is using.    #GERD  - Continue PPI    #Alcohol use disorder  - Continue acamprosate, folate, thiamine    #Low back pain  - Continue gabapentin    #Depression  - Per psychiatry team    DVT Ppx: not indicated, patient ambulating  Diet: Regular  Telemetry: Not indicated  Full code    Farris Has, MD, thank you for this consultation.  We will follow the patient with you during this hospitalization. Please contact us with any questions or issues.                History of Presenting Illness:   Gary [name]. is a 42 y.o. male with history of nephrolithiasis, low back pain, alcohol use disorder, and depression, admitted to inpatient psychiatry for management of depression with suicidal ideation. Medicine was consulted for concern of abdominal pain and nephrolithiasis.  Of note, he states that he was seen in a [facility] in June for severe abdominal pain that had him "doubled over." He was also having "fire-colored" urine. He underwent a CT ab/p 6/26, which revealed a 1.6 cm right nephrolithiasis with moderate dilation of mid and lower pole calyces. He states that he had a discussion with a surgeon who counseled him on the risk of infection and kidney problems with a stone that size, but he could not undergo surgery at that time as he was living on the street. He states that he was eventually planning  for stone removal in August.    Today, [name] reports that over the past 2 days he has had progressively worsening right-sided abdominal pain that radiates to the middle of his abdomen and to his back. He states that since his hospitalization in June he has had constant pain that has been relatively tolerable but it was always contained to the right side. The pain worsens with stretching and urinating. It is currently an 8/10, but improves to a 6/10 with Motrin. He was able to sleep through the night. He also states that his abdomen appears larger today than usual, and it feels full. He reports 1 week of dysuria; he is still producing his usual amount of urine and it is "crystal clear." States that he still has an appetite and has good PO intake. Denies testicular/scrotal pain. He also notes that he has been somewhat constipated. He typically has 1 soft bowel movement per day but for the past several days has had 2-3 hard, pellet-like bowel movements per day. States that Miralax is not helping. Denies fevers/chills, nausea, vomiting, diarrhea.    He denies history of other medical problems. No history of abdominal surgery. No family history of nephrolithiasis. He does not currently have housing or outpatient medical care.      Past Medical History:     Past Medical History:   Diagnosis Date    Anxiety     Depression        Available old records reviewed, including:  collateral from Epic, Care everywhere including CT ab/p 6/26    Past Surgical History:   History reviewed. No pertinent surgical history.    Family History:   History reviewed. No pertinent family history.    Social History:   Tobacco Use History[1]  Social History     Substance and Sexual Activity   Alcohol Use Yes    Comment: daily- "until I pass out" " Yesterday I had 3 pints of vodka" 10/28/2022     Social History     Substance and Sexual Activity   Drug Use Never       Allergies:   Allergies[2]    Medications:     Home Medications       Med List  Status: Complete Set By: Katrinka Blazing, RN at 10/29/2022 12:49 AM              ibuprofen (ADVIL) 800 MG tablet     Take 1 tablet (800 mg) by mouth 3 (three) times daily as needed     omeprazole (PriLOSEC) 40 MG capsule     Take 1 capsule (40 mg) by mouth daily            Current Facility-Administered Medications   Medication Dose Route Frequency    buPROPion XL  150 mg Oral Daily    folic acid  1 mg Oral Daily    gabapentin  400 mg  Oral Q8H SCH    mirtazapine  45 mg Oral QHS    naltrexone  25 mg Oral Daily    pantoprazole  40 mg Oral QAM AC    polyethylene glycol  17 g Oral Daily    thiamine  100 mg Oral Daily    vitamins/minerals  1 tablet Oral Daily            Review of Systems:   All other systems were reviewed and are negative unless noted in HPI.    Physical Exam:   Patient Vitals for the past 24 hrs:   BP Temp Temp src Pulse Resp SpO2   11/11/22 0654 117/76 97.5 F (36.4 C) Oral 78 -- 97 %   11/10/22 2034 133/90 98.4 F (36.9 C) Oral 96 18 97 %   11/10/22 1536 (!) 132/91 98.4 F (36.9 C) Oral 98 -- 96 %     Body mass index is 23.49 kg/m.  No intake or output data in the 24 hours ending 11/11/22 1427    General: awake, alert, oriented x 3; walking around room in no distress, well-appearing  Cardiovascular: normal rate and regular rhythm, no murmurs appreciated  Lungs: normal effort on room air, good bibasilar air entry, no wheezing, rhonchi, or rales  Abdomen: Normal bowel sounds. RUQ tenderness, no masses or hernias, no rebound or guarding.  Extremities: no edema  Neuro: no lateralizing deficits  Skin: no rashes or lesions noted    Labs:     Recent Labs     11/09/22  1606   WBC 8.22   Hemoglobin 14.2   Hematocrit 43.3   Platelet Count 311       Recent Labs     11/09/22  1606   Sodium 142   Potassium 4.4   Chloride 105   CO2 27   BUN 16   Creatinine 1.1   Glucose 119*   Calcium 9.1       Recent Labs     11/09/22  1606   AST (SGOT) 51*   ALT 102*   Alkaline Phosphatase 81   Protein, Total 7.3   Albumin  4.0       No results for input(s): "PTT", "PT", "INR" in the last 72 hours.    Imaging personally reviewed, including:   CT Abdomen Pelvis WO IV/ WO PO Cont    Result Date: 11/09/2022  1. Nonobstructing right nephrolithiasis. 2. Bilateral renal cysts. Georgiana Spinner, MD 11/09/2022 12:48 PM         Orlie Pollen, MD  11/11/2022  2:27 PM      cc: Farris Has, MD  Pcp, None, MD         [1]   Social History  Tobacco Use   Smoking Status Never   Smokeless Tobacco Never   [2]   Allergies  Allergen Reactions    Abilify [Aripiprazole]     Effexor [Venlafaxine]     Haldol [Haloperidol]     Lithium     Paxil [Paroxetine]     Prozac [Fluoxetine]     Vivitrol [Naltrexone]

## 2022-11-11 NOTE — UM Notes (Signed)
[facility]  [address]  [place], [place] [address]  NPI: [id number]  Tax ID: [id number]        [name]   DOB: March 25, 1981  [facility] [facility]  ID: [id number]         Attending:  Dr. Graceann Congress, MD / NPI: [id number]     Inpatient CSR for 10/28/2022     Legal Status: Voluntary     7/24 Per chart note of NP Eldridge Dace:  Patient expressed that he had been minimizing symptoms with treatment team in previous days. He shares that he feels "depressed and sad" today with intermittent passive SI but no active plan or intent at present. He reports that he has been trying to spend less time isolated to his room but he finds this difficult and distressing because of his social anxiety. He reports that alcohol has been his primary coping mechanism for many years and he worries about his ability to maintain his sobriety going forward.      Regarding medication management, patient reports that acamprosate has been unhelpful for managing his alcohol cravings and he expresses a desire to discontinue the medication, stating that he is unlikely to take it post-discharge anyway. He also expresses interest in potentially lowering vs. Discontinuing mirtazapine. Although he found the medication to be helpful for his sleep, he expresses concern that he may be too sedated to work if he were to continue it. Patient also spontaneously inquires about starting wellbutrin, sharing that he has historically benefited from the medication without adverse effects.      Patient had an interview with an [facility] yesterday evening; pending outcome.     7/24 Per Nursing note:  Patient remained isolative to himself and room for the entirety of shift, but was able to make his needs known. During assessment, patient described his mood as "okay," and continued to endorse severe depression and anxiety, passive suicidal ideations and pain.      Patient also received PRN Vistaril 75 mg for increased  anxiety; medication had adequate effect.     7/24 Per Occupational Therapist note:  Level of Participation: withdrawn, minimal  Quality of Participation: quiet and withdrawn  Interactions with others:  minimal, quiet  Mood/Affect: anxious and depressed  Cognition: UTA  Progress: First encounter  Following Commands: independent  Gait/movement: steady and coordinated  Response: Pt invited to group by Writer and amenable to going. Pt's speech soft, looking mostly outward and down. Pt interacted with Clinical research associate but not peers.     7/24 Per SW/[place] Planner note:  The SW will explore his interest in participating in virtual AA meetings as his aftercare plan.     7/27 Per BH Therapist note:  Group: BH - Creative  Group Topics: Expressive   Level of Participation: when cued  Quality of Participation: cooperative  Interactions with Others: gave feedback  Mood/Affect: flat  Triggers (if applicable): N/A  Cognition: rigid  Organization: worked independently  Progress: Minimal  Response: Pt was sitting and observing peers throughout the group.     7/27 Per Nursing note:  ohn rated their depression as 10/10 and anxiety as 8/10 (with 10 being the highest possible value).     7/27 Per BH Therapist note:  Group: BH - Wrap Up  Group Topics: Day in Review   Level of Participation: minimal  Quality of Participation: isolative, quiet, and withdrawn  Interactions with Others:  quiet  Mood/Affect: anxious, depressed, and flat  Triggers (if applicable): N/A  Cognition: UTA  Organization:  UTA  Progress: Patient attended 2 out of 6 groups today. Patient agreed to come to wrap up group if he did not have to talk. Patient did not talk at all in the group. Given positive feedback for coming.     7/24 VS:  T 97.5, HR 75, RR 17, bp 114/70, Pox 97%    7/24 Psychiatric Diagnoses per NP Eldridge Dace:   Depression, unspecified  Rule out MDD vs substance-induced mood disorder  F40.10 Social Anxiety Disorder  Alcohol Use Disorder  Rule out PTSD     7/24 Mental  Status Exam per NP Eldridge Dace:  General appearance: Appears chronological age, Good hygiene, and Good grooming  Attitude/Behavior: Cooperative, Eye Contact is  Good, and calm  Motor: No abnormalities noted  Gait: No obvious abnormalities  Muscle strength and tone: grossly intact  Speech:  Spontaneous: Yes  Rate and Rhythm: Normal  Volume: Normal  Tone: Normal  Clarity: Yes  Mood:"okay"  Affect:  Dysthymic   Range: Constricted  Stability: Stable  Appropriateness to thought content: Yes  Intensity: Normal  Thought Process:  Linear   Coherent: Yes  Logical:  Yes  Associations: Goal-directed  Thought Content:  Delusions:  Not elicited  Depressive Cognitions:   Worthless, and Guilt, some hope expressed  Suicidal:  Passive Suicidal thoughts  Homicidal:  No homicidal thoughts  Violent Thoughts:  No  Perceptions:  Hallucinations: No  Insight: Fair  Judgment: Fair  Cognition:  Level of Consciousness: Intact  Orientation: Intact to self, place and time    7/24 Presenting Mental Status per Nursing:  Consciousness: Alert  Memory: No Impairment   Thought Content: WNL  Thought Process: Linear and goal-directed   Perception: No perceptual disturbances   Mood: Depressed;Anxious   Affect: Mood-congruent   Attitude: Cooperative   Behavior: Calm   Speech: WNL   Eye Contact: WNL   Appearance: Appropriatly dressed   Insight: Poor   Judgment: Impaired  Impulse Control: Intact  Concentration: Difficulty sustaining attention   Energy: Decreased   Appetite: WNL     7/24 Assessment / Plan per MD Dr. Graceann Congress:  Patient reports that he had been minimizing his symptoms so as to please primary team. He reports having hx of "people-pleasing" and tending to other people's needs at the detriment of his own. Patient reports that he is depressed, with ongoing suicidal thoughts regarding jumping from a bridge. Feeling hopeless about alcohol use, as his cravings are still very strong. Amenable to switch from acamprosate to naltrexone. Patient  reports that Wellbutrin has historically been helpful for him. Denies hx of complicated withdrawal or seizures. Discussed risk of decreasing seizure threshold, and importance of coming to hospital for detox if he ends up relapsing, drinking heavily, and wishing to come off alcohol. Discussed risks associated with sudden alcohol cessation.     7/25 Per SW/[place] Planner note:  During the treatment team meeting, it was reported that the Pt is still reporting SI and depression, but he has been downplaying his symptoms because he feels compelled to say what he believes the treatment team wants to hear.   The SW will discuss virtual rehab as an aftercare plan with Pt.     7/24-7/25 Scheduled and PRN Meds:  Campral 666mg  po tid, [place]'d 7/24  Wellbutrin XL 150mg  po daily, start 7/24  Neurontin 400mg  po q8hr   Remeron 45mg  po qHS   Revia 25mg  po daily, start 7/25-->50mg  po  daily (7/26)  Vistaril 75mg  po q4hr prn (rec'd 1 dose on 7/23 & 1 dose on 7/25)  Seroquel 25mg  po bid prn      7/24-7/25 Treatment plan:  Activity as tolerated  Suicide precautions  Close monitoring q checks        VS bid   Ensure Plus High Protein supplement bid w/breakfast & dinner  Bladder scan / PVR  Medication management and stabilization  Group/individual therapy  Therapeutic Recreation eval and treat  D/c planning                 Rowan Blase, BSN, RN, [employer], ACM-RN          Utilization Review   [facility]  [address]  [address]  [place], [place] [address]  Phone: [phone] (VM)  [place]: [phone]  Fax: [phone]

## 2022-11-11 NOTE — Progress Notes (Signed)
Situation: Routine follow-up with patient per his previous request.          Background: See previous spiritual care note(s).          Assessment:  Patient shared that since our last conversation, he was told that his care team ordered further testing on his liver.  He said he is still processing the possibility of having liver problems, as this is his first time being told he might have them.  He has been significantly anxious today. He talked about multiple rehab programs that he left, expressing that he doesn't feel he will be able to stop drinking.  He noted that attending groups makes him anxious.  He expressed that he is weary of facing so many negative events without social support.          Recommendation:  Patient appreciates continued spiritual care as schedule permits.  Chaplain messaged patient's RN via [employer] regarding his concern about his level of anxiety.        Rev. Jackquline Berlin, M.Div., [facility]  Chaplain, [employer], [employer], and [employer], [facility]  [facility] [id number]  [phone]  [email or web address]  In emergencies page [phone]

## 2022-11-11 NOTE — Plan of Care (Addendum)
Problem: Pain interferes with ability to perform ADL  Goal: Pain at adequate level as identified by patient  Description: Interventions:  1. Identify patient comfort function goal  2. Evaluate if patient comfort function goal is met  3. Assess pain on admission, during daily assessment and/or before any "as needed" intervention(s)  4. Reassess pain within 30-60 minutes of any procedure/intervention, per Pain Assessment, Intervention, Reassessment (AIR) Cycle  5. Evaluate patient's satisfaction with pain management progress  6. Offer non-pharmacological pain management interventions  7. Consult/collaborate with Pain Service  8. Consult/collaborate with Physical Therapy, Occupational Therapy, and/or Speech Therapy  9. Assess for risk of opioid induced respiratory depression and side effects, including snoring/sleep apnea. Alert healthcare team of risk factors identified.  10. Include patient/patient care companion in decisions related to pain management as needed  Outcome: Not Progressing   Donta rated pain at 8/10  Problem: IP Suicidal Ideation  Description: Aadyn endorses suicidal ideation with plan to jump off bridge  Goal: LTG: Trevor will exhibit compliance with therapy  Outcome: Progressing  Con attended group therapy x1  Goal: STG: Alba will not engage in self-injurious activities  Outcome: Progressing  Mekai did not engage in self-injurious activities.     Baran is A+Ox4 with mood-congruent affect and WNL speech. Endorses passive SI denies HI/AVH/SIB urges. BVC is 0. Pt states they are committed to maintaining their safety, encouraged to come to staff if thoughts or urges to harm self or others increases, Tarry verbalizes understanding. Pt is cooperative with Clinical research associate, able to answer questions and maintain eye contact. Pt describes their mood as depressed and anxious. Reported good sleep/energy/appetite.  Matai rated their depression as 9/10 and anxiety as 8/10 (with 10 being the highest possible value). Amy states  their goal for today is attend group therapies. Trelon is compliant with medication administration and mouth check completed. Admiral was visible in the milieu, participated in x1 groups with peers and staff appropriately. Q15 min safety checks maintained. Will continue to monitor and ensure safety.  PRN ibuprofen and Tylenol for abdominal pain and vistaril for anxiety.       "I, Mekisha Bittel, hereby attest that I have been made aware of and reviewed the following for this patient:     - Biopsychosocial assessment;  - Diagnoses;  - Nursing needs and medical support, if applicable; and  - Treatment Plan Goals, Objectives and Strategies/Interventions.     I am involved in the care of this patient within my scope of practice and assigned tasks. I approve the treatment plan and this attestation is in lieu of a signature on the document."

## 2022-11-11 NOTE — Plan of Care (Addendum)
Problem: IP Suicidal Ideation  Description: Gary [name] endorses suicidal ideation with plan to jump off bridge  Goal: LTG: Gary [name] will exhibit compliance with therapy.  Outcome: Not Progressing  Patient remains nonadherent to group participation, however patient is complaint with medication regimen.     Problem: IP Suicidal Ideation  Description: Gary [name] endorses suicidal ideation with plan to jump off bridge.  Goal: STG: Gary [name] will not engage in self-injurious activities.  Outcome: Progressing  Patient remained safe and free of self-injurious behavior throughout the shift.     Gary [name] was alert and oriented times four and presented with appropriate speech; a depressed and anxious mood; mood-congruent affect; and a cooperative, attitude during interactions with this Clinical research associate. On approach, Gary [name] could be witnessed sitting in bed, reading a book, with no distress noted. Patient remained isolative to himself and room for the majority of shift, but was able to make his needs known. During assessment, patient described his mood as "the same," and continued to endorse severe depression and anxiety, passive suicidal ideations and pain. Patient was encouraged to report any worsening feelings of depression/anxiety, persistent thoughts of self harm/harm to others, and overall concerns to this Clinical research associate and staff; patient verbalized understanding. Gary [name] did deny feelings/thoughts of homicidal ideations and audiovisual hallucinations. Patient received PRN Ibuprofen 800 mg and Tylenol 650 mg for worsening flank pain related to kidney stones. Gary [name] was medication compliant and mouth check was conducted after administration. Patient reported no issues with appetite, sleep and toileting habits. Overall, no major behavioral issues to report.     PRN Medication(s):  Ibuprofen 800 mg given @ 2128 and Tylenol 650 mg given @ 2300.     Q15 minute checks and hourly RN rounding for patient safety maintained, and plan of care continues.      Patient was  witnessed resting for approximately 7 hours as of 0600.

## 2022-11-11 NOTE — Progress Notes (Signed)
Psychiatry Progress Note  (Level 1 = Problem Focused, Level 2 = Expanded Problem Focused, Level 3 = Detailed)    Patient Name: Gary [name].            Current Date/Time:  7/25/20243:27 PM  MRN:  [id number]                            Attending Physician: Farris Has, MD  DOB: May 12, 1980                                Gender: male                              I. History   Informants: Patient, medical record, treatment team   A. Chief Complaint or Reason for Admission       Admitted for worsening depression and suicidal ideation.    B.History of Present Illness: Interval History     (Symptoms and qualifiers:1 for Level 1, 2 for Level 2 and 3+ for Level 3)  Patient is a 42 y.o. divorced, un-domiciled male with history of depression, anxiety, and alcohol use disorder admitted for worsening depression and suicidal ideation with a plan to jump off a bridge. Patient was evaluated by the [facility] and is currently admitted voluntarily.     November 11, 2022  Patient shares that he has been frustrated and anxious this morning related to grievances with staff. He reports that PRN vistaril was helpful for managing his anxiety and irritability. He shares that he is tolerating naltrexone and wellbutrin well without adverse effects but he has noticed minimal benefit thus far. He reflects that he has felt overwhelmed by his numerous psychosocial stressors in recent years and he continues to describe passive suicidal thoughts, wishing "that someone would just put me out my misery." Provided supportive therapy.     Patient has otherwise been more visible on the milieu and he was observed ambulating the hallways with a peer. He denies active suicidal plans or intent and he denies AVH.     C.Medical History  Review of Systems  (Level 1 is none, Level 2 is 1, and Level 3 is 2+)  A complete 14 point ROS was done and was negative except for    Psychiatric: Depression, Anxiety,   Constitutional: craving for alcohol     D.Additional  Past Psychiatric, Substance Use, Medical, Family and Social History  Psychiatric: No new information available as compared to previous encounter(s)  Substance Use: No new information available as compared to previous encounter(s)  Medical: No new information available as compared to previous encounter(s)  Family: No new information available as compared to previous encounter(s)  Social: No new information available as compared to previous encounter(s)    Medications:   Current Medications       Scheduled       Medication Ordered Dose/Rate, Route, Frequency Last Action    buPROPion XL (WELLBUTRIN XL) 24 hr tablet 150 mg    150 mg, PO, Daily Given, 150 mg at 07/25 0836    folic acid (FOLVITE) tablet 1 mg    1 mg, PO, Daily Given, 1 mg at 07/25 0836    gabapentin (NEURONTIN) capsule 400 mg    400 mg, PO, Q8H SCH Given, 400 mg at 07/25 1421    mirtazapine (REMERON)  tablet 45 mg    45 mg, PO, QHS Given, 45 mg at 07/24 2128    naltrexone (REVIA) tablet 50 mg    50 mg, PO, Daily Ordered    pantoprazole (PROTONIX) EC tablet 40 mg    40 mg, PO, QAM AC Given, 40 mg at 07/25 0800    polyethylene glycol (MIRALAX) packet 17 g    17 g, PO, Daily Given, 17 g at 07/24 2706    thiamine (VITAMIN B1) tablet 100 mg    100 mg, PO, Daily Given, 100 mg at 07/25 0837    vitamins/minerals tablet 1 tablet    1 tablet, PO, Daily Given, 1 tablet at 07/25 0836              PRN       Medication Ordered Dose/Rate, Route, Frequency Last Action    acetaminophen (TYLENOL) tablet 650 mg    650 mg, PO, Q6H PRN Given, 650 mg at 07/25 1421    alum & mag hydroxide-simethicone (MAALOX PLUS) 200-200-20 mg/5 mL suspension 30 mL    30 mL, PO, Q6H PRN Ordered    diphenhydrAMINE (BENADRYL) capsule 50 mg (Or Linked Group #1)    50 mg, PO, Once PRN Ordered    diphenhydrAMINE (BENADRYL) injection 50 mg (Or Linked Group #1)    50 mg, IM, Once PRN Ordered    hydrOXYzine (VISTARIL) capsule 100 mg    100 mg, PO, Q4H PRN Ordered    ibuprofen (ADVIL) tablet 800 mg     800 mg, PO, Q6H PRN Given, 800 mg at 07/25 1032    QUEtiapine (SEROquel) tablet 25 mg    25 mg, PO, BID PRN Ordered                    II. Examination   Vital signs reviewed:   Blood pressure 135/61, pulse (!) 101, temperature 98.8 F (37.1 C), temperature source Oral, resp. rate 18, height 1.702 m (5\' 7" ), weight 68 kg (150 lb), SpO2 96%.     Mental Status Exam  (Level 1 is 1-5, Level 2 is 6-8, Level 3 is 9+)  General appearance: Appears chronological age, Good hygiene, and Good grooming  Attitude/Behavior: Cooperative, Eye Contact is  Good, and calm  Motor: No abnormalities noted  Gait: No obvious abnormalities  Muscle strength and tone: grossly intact  Speech:   Spontaneous: Yes  Rate and Rhythm: Normal  Volume: Normal  Tone: Normal  Clarity: Yes  Mood:"frustrated"  Affect:  Dysthymic    Range: Constricted  Stability: Stable  Appropriateness to thought content: Yes  Intensity: Normal  Thought Process:     Linear    Coherent: Yes  Logical:  Yes  Associations: Goal-directed  Thought Content:   Delusions:  Not elicited  Depressive Cognitions:   Worthless, and Guilt, some hope expressed  Suicidal:  Passive Suicidal thoughts  Homicidal:  No homicidal thoughts  Violent Thoughts:  No  Perceptions:   Hallucinations: No  Insight: Fair  Judgment: Fair  Cognition:   Level of Consciousness: Intact  Orientation: Intact to self, place and time    Psychiatric / Cognitive Instruments: None    Pertinent Physical Exam: Not relevant to current chief complaint/reason for admission    Imaging / EKG / Labs: Labs in the last 24 hours  Results       Procedure Component Value Units Date/Time    Urinalysis with Microscopic Exam (Order) [[id number]] Collected: 11/10/22 2212    Specimen: Urine,  Clean Catch Updated: 11/11/22 0001    Narrative:      The following orders were created for panel order Urinalysis with Microscopic Exam (Order).  Procedure                               Abnormality         Status                     ---------                                -----------         ------                     Urinalysis with Microsco..Marland Kitchen[[id number]]  Normal              Final result               Urine [employer] .Marland KitchenMarland Kitchen[[id number]]                      Final result                 Please view results for these tests on the individual orders.    Urine [employer] Tube [[id number]] Collected: 11/10/22 2212    Specimen: Urine, Clean Catch Updated: 11/11/22 0001     Extra Tube Hold for add-ons.    Urinalysis with Microscopic Exam (Component) [[id number]]  (Normal) Collected: 11/10/22 2212    Specimen: Urine, Clean Catch Updated: 11/10/22 2301     Urine Color Colorless     Urine Clarity Clear     Urine Specific Gravity 1.020     Urine pH 6.5     Urine Leukocyte Esterase Negative     Urine Nitrite Negative     Urine Protein Negative     Urine Glucose Negative     Urine Ketones Negative mg/dL      Urine Urobilinogen Normal mg/dL      Urine Bilirubin Negative     Urine Blood Negative     RBC, UA 0-2 /hpf      Urine WBC 0-5 /hpf      Urine Squamous Epithelial Cells 0-5 /hpf     Hepatitis Panel, Acute (Order) [[id number]] Collected: 11/09/22 1606    Specimen: Blood, Venous Updated: 11/10/22 2100    Narrative:      The following orders were created for panel order Hepatitis Panel, Acute (Order).  Procedure                               Abnormality         Status                     ---------                               -----------         ------                     Hepatitis Panel, Acute (.Marland KitchenMarland Kitchen[[id number]]  Normal              Final result  Gold SST Hold [phone]                               Final result                 Please view results for these tests on the individual orders.    [employer] Hold Tube [[id number]] Collected: 11/09/22 1606    Specimen: Blood, Venous Updated: 11/10/22 2100     Extra Tube Hold for add-ons.          EKG Results   Cardiology Results       Procedure Component Value Units Date/Time    ECG 12 lead  (Electrocardiogram) [[id number]] Collected: 10/28/22 1740     Updated: 10/28/22 1753     Ventricular Rate 103 BPM      Atrial Rate 103 BPM      P-R Interval 148 ms      QRS Duration 98 ms      Q-T Interval 334 ms      QTC Calculation (Bezet) 437 ms      P Axis 47 degrees      R Axis 49 degrees      T Axis 37 degrees      [facility] MUSE NARRATIVE AND IMPRESSION --     SINUS TACHYCARDIA  OTHERWISE NORMAL ECG  NO PREVIOUS ECGS AVAILABLE  Confirmed by Neomia Dear MD, [place] ([id number]) on 10/28/2022 5:53:08 PM      Narrative:      SINUS TACHYCARDIA  OTHERWISE NORMAL ECG  NO PREVIOUS ECGS AVAILABLE  Confirmed by Neomia Dear MD, [place] ([id number]) on 10/28/2022 5:53:08 PM          Brain Scan No results found for any visits on 10/28/22.    III. Assessment and Plan (Medical Decision Making)     1. I certify that this patient continues to require inpatient hospitalization due to acute risk to self and unable to care for self in the community with insufficient support available    2. Psychiatric Diagnoses  Depression, unspecified  Rule out MDD vs substance-induced mood disorder  Social Anxiety Disorder  Alcohol Use Disorder  Rule out PTSD    3. Additional Diagnoses    4.All diagnostic procedures completed since admission were reviewed. Past medical records reviewed. Coordination of care was discussed with inpatient team and as available with the outpatient team.    5. On this admission patient educated about and provided input into their treatment plan.  Patient understands potential risks and benefits of proposed treatment plan.     6.  Assessment / Impression  Patient is a 42 y.o. divorced, un-domiciled male with history of depression, anxiety, and alcohol use disorder admitted for worsening depression and suicidal ideation with a plan to jump off a bridge. Patient was evaluated by the [facility] and is currently admitted voluntarily.     7/21:   The patient continues to report depressed mood and passive SI, reporting that he cannot identify a reason to live or to  remain sober.  He reports minimal improvement in anxiety so far on gabapentin and reports 8/10 depression.  Ongoing cravings for alcohol.  Patient requires inpatient hospitalization for medication adjustment and safety.      7/22:   continues to report anxious and depressive cognitions , and struggles with alcohol craving. Reported getting relief at moments of taking gabapentin. Today is the first time patient denied struggles with SI. Continue current med dosing;  gabapentin was increased yesterday  -pending interview with [employer] (expected this week)    7/23: Patient feeling depressed and hopeless. Apparently, pt was seen by Chaplain for spiritual care, and pt had informed chaplain that he feels like he is being rushed out for discharge, therefore he had been hiding SI when in fact he was feeling  suicidal. Pt would need to be reassured tomorrow in the context of his own struggles.   - [facility]: pt does not sound hopeful about it, but he can have an appt with [facility] that patient can attend post discharge and make a decision about  - SI:  ongoing  - Nephrolithiasis: Internal Medicine consult note appreciated. Pending Urology consult, and repeat CT .     7/24: Patient expressed that he had been minimizing the extent of his depression and suicidality in recent days but he is more forthcoming today. He reports that his mood is low and he continues to endorse passive suicidal thoughts. He is tolerating his current medication regimen but reports minimal benefit and expresses interest in changes. Will discontinue Acamprosate as patient has found this medication unhelpful and shares that he is unlikely to continue post-discharge. Will start wellbutrin XL 150 mg daily to target depression; patient reported historical benefit from wellbutrin without adverse effects. Will monitor closely for tolerability.     7/25: patient reports irritability, frustration, and ongoing hopelessness with passive SI related to  his psychosocial stressors (unstable housing, lack of social support, unemployment). He is tolerating wellbutrin and naltrexone well. Will increase naltrexone to 50 mg daily starting tomorrow for alcohol use disorder, and will increase PRN hydroxyzine from 75 mg to 100 mg q6h PRN for anxiety.     At this time, the patient would continue to benefit from inpatient psychiatric hospitalization for safety, stabilization, diagnostic clarification, medication management, and discharge planning. SW assisting with placement.     Suicide Risk Assessment  - Passive SI; no active plan nor intent     Plan / Recommendations:  Biological Plan:  Medications:   Mirtazapine 45 mg qhs  Gabapentin 400 mg TID (increased on 7/21)  Wellbutrin XL 150 mg daily  Increase naltrexone from 25 mg to 50 mg tomorrow,  Increase PRN vistaril from 75 mg q4 hours to 100 mg q 6 hours for anxiety  Scheduled Meds:  Current Facility-Administered Medications   Medication Dose Route Frequency    buPROPion XL  150 mg Oral Daily    folic acid  1 mg Oral Daily    gabapentin  400 mg Oral Q8H SCH    mirtazapine  45 mg Oral QHS    [START ON 11/12/2022] naltrexone  50 mg Oral Daily    pantoprazole  40 mg Oral QAM AC    polyethylene glycol  17 g Oral Daily    thiamine  100 mg Oral Daily    vitamins/minerals  1 tablet Oral Daily     Continuous Infusions:  PRN Meds:.acetaminophen, alum & mag hydroxide-simethicone, diphenhydrAMINE **OR** diphenhydrAMINE, hydrOXYzine, ibuprofen, QUEtiapine    Medical Work-up:  as per unit FNP  Consults: IM and Urology teams consulted and appreciated    Psychosocial Plan:  Individual therapy: Psycho-education and psychotherapy of the following modalities will continue to be provided during daily visits:Supportive, Cognitive Behavioral Therapy, and Insight Oriented Psychotherapy  Group and milieu therapies: daily per unit's schedule.  Continue with Social Work intervention provided for discharge planning including assistance with  placement and follow-up psychiatric care.    Plan  for Family Involvement: None Available  Other Providers Contact Information and Dates Contacted: No Updates    I personally saw and examined the patient and spent 50 minutes on this visit reviewing previous notes, counseling the patient and/or family members regarding the patient's condition(s), ordering of tests, managing medications, and documenting the findings in the note.   Patient was also seen by Dr. Merlinda Frederick. The assessment and plan were discussed and formulated together and has been reviewed and updated as needed.      Signed by: Eldridge Dace, NP  11/11/2022  3:27 PM

## 2022-11-11 NOTE — Group Note (Signed)
Group: BH - Creative  Group Topics: Expressive   Group Date: 11/11/2022  Start Time: 1500  End Time: 1600  Number of Participants: 5  Facilitators: Tonette Lederer T   Department: [facility] [address]    Discipline Led: BHT  Number of Patient Participants: 5      Name: Gary [name]. Date of Birth: 1980/11/25   MR: [id number]      Level of Participation: arrived late  Quality of Participation: withdrawn  Interactions with Others: Noon   Mood/Affect: closed / guarded, depressed, and flat  Triggers (if applicable): N/A  Cognition: logical  Organization: worked independently  Progress: Minimal  Response: Pt came in for about 5 min and walk out of group without returning. Pt was sad after talking to spirituals provider.   Plan: patient will be encouraged to attends and participates in all groups.     Patients Problems:   Patient Active Problem List   Diagnosis    Suicidal ideation

## 2022-11-11 NOTE — Progress Notes (Signed)
During the treatment team meeting, it was reported that the Pt is still reporting SI and depression, but he has been downplaying his symptoms because he feels compelled to say what he believes the treatment team wants to hear.    The SW will discuss virtual rehab as an aftercare plan with Pt.     Tyna Jaksch, MSW

## 2022-11-12 ENCOUNTER — Inpatient Hospital Stay: Payer: Medicaid Other

## 2022-11-12 DIAGNOSIS — Z789 Other specified health status: Secondary | ICD-10-CM

## 2022-11-12 LAB — HEPATIC FUNCTION PANEL (LFT)
ALT: 121 U/L — ABNORMAL HIGH (ref 0–55)
AST (SGOT): 45 U/L — ABNORMAL HIGH (ref 5–41)
Albumin/Globulin Ratio: 1.3 (ref 0.9–2.2)
Albumin: 3.5 g/dL (ref 3.5–5.0)
Alkaline Phosphatase: 75 U/L (ref 37–117)
Bilirubin Direct: 0.1 mg/dL (ref 0.0–0.5)
Bilirubin Indirect: 0.1 mg/dL — ABNORMAL LOW (ref 0.2–1.0)
Bilirubin, Total: 0.2 mg/dL (ref 0.2–1.2)
Globulin: 2.6 g/dL (ref 2.0–3.6)
Hemolysis Index: 6 Index
Protein, Total: 6.1 g/dL (ref 6.0–8.3)

## 2022-11-12 LAB — PT/INR
INR: 1 (ref 0.9–1.1)
PT: 10.9 s (ref 10.1–12.9)

## 2022-11-12 LAB — THYROID STIMULATING HORMONE (TSH) WITH REFLEX TO FREE T4: TSH: 4.5 u[IU]/mL (ref 0.35–4.94)

## 2022-11-12 NOTE — Plan of Care (Signed)
Problem: IP Suicidal Ideation  Description: Gary [name] endorses suicidal ideation with plan to jump off bridge  Goal: LTG: Gary [name] will exhibit compliance with therapy  Outcome: Progressing  Gary [name] interacts with treatment team and takes meds prescribed for him. He engages with team members during rounds.  Goal: STG: Gary [name] will not engage in self-injurious activities  Outcome: Progressing  Gary [name] has not engaged in SIB since first arriving.      Gary [name] was A+Ox4 with constricted affect and clear speech. He denied active SI/HI/AVH/SIB urges, however stated he "always" has passive SI. BVC is 0. Pt states they are committed to maintaining their safety, encouraged to come to staff if thoughts or urges to harm self or others increases, Gary [name] verbalizes understanding. Pt is calm and cooperative with Clinical research associate, able to answer questions and maintain good eye contact. Pt describes their mood as stable. He denied issues with  sleep/energy/appetite.  Gary [name] rated their depression as 8 and anxiety as 8 (with 10 being the highest possible value). Gary [name] states their goal for today is to participate. Gary [name] was compliant with medication administration and mouth check completed. Gary [name] was visible in the milieu throughout the shift,  participated in groups, and interacted with peers and staff appropriately. Q15 min safety checks maintained. Will continue to monitor and ensure safety.  PRNs: Vistaril and Ibuprofen at 9am, Acetaminophen at 12

## 2022-11-12 NOTE — Progress Notes (Signed)
MEDICINE CONSULT FOLLOW UP    Date Time: 11/12/22 3:27 PM  Patient Name: Gary [name],[name]  Requesting Physician: Farris Has, MD  Consulting Physician: Orlie Pollen, MD    Primary Care Physician: Marisa Sprinkles, MD    Reason for Consultation: Abdominal pain      Assessment:     Patient Active Problem List    Diagnosis Date Noted    Suicidal ideation 10/28/2022     42 y/o man with history of nephrolithiasis, alcohol use disorder, low back pain, and depression, admitted for management of depression and suicidal ideation, now with worsening abdominal pain and dysuria, most concerning for persistent or recurrent nephrolithiasis without signs of sepsis. Currently afebrile and hemodynamically stable.    Recommendations:   #Abdominal pain  With history of recent 1.6 cm stone, concern for persistent nephrolithiasis. Patient is still urinating well. Patient has dysuria but otherwise no indication of infection/sepsis (afebrile, no subjective chills, nausea). Low suspicion for acute surgical pathology I.e. testicular torsion, appendicitis, based on exam and history. May also be a component of constipation and gas pain contributing to his abdominal discomfort, given his distention and tympany. Still passing flatus, so low suspicion for complete obstruction  - CBC with diff, CMP, UA ordered  - Follow up CT ab/p  - If <10 mm stone present without significant obstruction, would proceed with pain management, hydration +/- alpha blocker  - If obstruction, significant AKI, or intractable pain, may require urologic intervention  - Encourage PO fluid consumption, goal UOP>2500 mL/day  - Pain management: continue acetaminophen 650 mg PRN, ibuprofen 800 mg q6h PRN   - Added oxycodone 5 mg q6h, one-time dilaudid 1 mg for breakthrough  - Can defer antibiotics pending UA  - Bowel regimen: Miralax once daily, can increase to BID, recommend adding Pericolace BID PRN  7/24: Appreciate Urology input.  I think the abdominal pain  which is mostly in the RUQ and Epigastric region with tenderness as well is probably due to alcoholic liver disease.  I've advised the patient to quit drinking which should help him.  Hep Panel is negative.  F/u Lipase but doubt pancreatitis coz the CT did not show signs of it.  LFTs are elevated, but not the typical Alcohol pattern. Will follow LFTs in 1-2 days.  May get US Liver if pain does not improve or if LFTs trending up.  7/25: Still c/o 9-10/10 Abdominal pain. Pain is out of proportion to signs and symptoms.  Will get a RUQ [place]. F/u LFTs in AM.  I told him to limit the amout of ibuprofen he is using.  7/26: [place] -ve, LFTs still elevated.  Bladder Scan -ve.    #GERD  - Continue PPI    #Alcohol use disorder  - Continue acamprosate, folate, thiamine    #Low back pain  - Continue gabapentin    #Depression  - Per psychiatry team    DVT Ppx: not indicated, patient ambulating  Diet: Regular  Telemetry: Not indicated  Full code    Farris Has, MD, thank you for this consultation.  We will follow the patient with you during this hospitalization. Please contact us with any questions or issues.                History of Presenting Illness:   Lo Gary [name]. is a 42 y.o. male with history of nephrolithiasis, low back pain, alcohol use disorder, and depression, admitted to inpatient psychiatry for management of depression with suicidal ideation.  Medicine was consulted for concern of abdominal pain and nephrolithiasis.    Of note, he states that he was seen in a [facility] in June for severe abdominal pain that had him "doubled over." He was also having "fire-colored" urine. He underwent a CT ab/p 6/26, which revealed a 1.6 cm right nephrolithiasis with moderate dilation of mid and lower pole calyces. He states that he had a discussion with a surgeon who counseled him on the risk of infection and kidney problems with a stone that size, but he could not undergo surgery at that time as he was living on  the street. He states that he was eventually planning for stone removal in August.    Today, Gary [name] reports that over the past 2 days he has had progressively worsening right-sided abdominal pain that radiates to the middle of his abdomen and to his back. He states that since his hospitalization in June he has had constant pain that has been relatively tolerable but it was always contained to the right side. The pain worsens with stretching and urinating. It is currently an 8/10, but improves to a 6/10 with Motrin. He was able to sleep through the night. He also states that his abdomen appears larger today than usual, and it feels full. He reports 1 week of dysuria; he is still producing his usual amount of urine and it is "crystal clear." States that he still has an appetite and has good PO intake. Denies testicular/scrotal pain. He also notes that he has been somewhat constipated. He typically has 1 soft bowel movement per day but for the past several days has had 2-3 hard, pellet-like bowel movements per day. States that Miralax is not helping. Denies fevers/chills, nausea, vomiting, diarrhea.    He denies history of other medical problems. No history of abdominal surgery. No family history of nephrolithiasis. He does not currently have housing or outpatient medical care.      Past Medical History:     Past Medical History:   Diagnosis Date    Anxiety     Depression        Available old records reviewed, including:  collateral from Epic, Care everywhere including CT ab/p 6/26    Past Surgical History:   History reviewed. No pertinent surgical history.    Family History:   History reviewed. No pertinent family history.    Social History:   Tobacco Use History[1]  Social History     Substance and Sexual Activity   Alcohol Use Yes    Comment: daily- "until I pass out" " Yesterday I had 3 pints of vodka" 10/28/2022     Social History     Substance and Sexual Activity   Drug Use Never       Allergies:    Allergies[2]    Medications:     Home Medications       Med List Status: Complete Set By: Katrinka Blazing, RN at 10/29/2022 12:49 AM              ibuprofen (ADVIL) 800 MG tablet     Take 1 tablet (800 mg) by mouth 3 (three) times daily as needed     omeprazole (PriLOSEC) 40 MG capsule     Take 1 capsule (40 mg) by mouth daily            Current Facility-Administered Medications   Medication Dose Route Frequency    buPROPion XL  150 mg Oral Daily    folic  acid  1 mg Oral Daily    gabapentin  400 mg Oral Q8H SCH    mirtazapine  45 mg Oral QHS    naltrexone  50 mg Oral Daily    pantoprazole  40 mg Oral QAM AC    polyethylene glycol  17 g Oral Daily    thiamine  100 mg Oral Daily    vitamins/minerals  1 tablet Oral Daily            Review of Systems:   All other systems were reviewed and are negative unless noted in HPI.    Physical Exam:   Patient Vitals for the past 24 hrs:   BP Temp Temp src Pulse SpO2   11/12/22 1440 115/81 98.6 F (37 C) Oral 91 93 %   11/12/22 0810 124/80 97.7 F (36.5 C) Oral 91 96 %   11/11/22 2012 127/85 98.1 F (36.7 C) Oral 87 97 %     Body mass index is 23.49 kg/m.  No intake or output data in the 24 hours ending 11/12/22 1527    General: awake, alert, oriented x 3; walking around room in no distress, well-appearing  Cardiovascular: normal rate and regular rhythm, no murmurs appreciated  Lungs: normal effort on room air, good bibasilar air entry, no wheezing, rhonchi, or rales  Abdomen: Normal bowel sounds. RUQ tenderness, no masses or hernias, no rebound or guarding.  Extremities: no edema  Neuro: no lateralizing deficits  Skin: no rashes or lesions noted    Labs:     Recent Labs     11/09/22  1606   WBC 8.22   Hemoglobin 14.2   Hematocrit 43.3   Platelet Count 311       Recent Labs     11/09/22  1606   Sodium 142   Potassium 4.4   Chloride 105   CO2 27   BUN 16   Creatinine 1.1   Glucose 119*   Calcium 9.1       Recent Labs     11/12/22  0811 11/09/22  1606   AST (SGOT) 45* 51*    ALT 121* 102*   Alkaline Phosphatase 75 81   Protein, Total 6.1 7.3   Albumin 3.5 4.0       Recent Labs     11/12/22  0811   PT 10.9   INR 1.0       Imaging personally reviewed, including:   US Abdomen Limited Ruq    Result Date: 11/12/2022   No acute abnormality. Nonacute findings detailed above. Johnsie Kindred, MD 11/12/2022 6:40 AM    CT Abdomen Pelvis WO IV/ WO PO Cont    Result Date: 11/09/2022  1. Nonobstructing right nephrolithiasis. 2. Bilateral renal cysts. Georgiana Spinner, MD 11/09/2022 12:48 PM         Orlie Pollen, MD  11/12/2022  3:27 PM      cc: Farris Has, MD  Pcp, None, MD         [1]   Social History  Tobacco Use   Smoking Status Never   Smokeless Tobacco Never   [2]   Allergies  Allergen Reactions    Abilify [Aripiprazole]     Effexor [Venlafaxine]     Haldol [Haloperidol]     Lithium     Paxil [Paroxetine]     Prozac [Fluoxetine]     Vivitrol [Naltrexone]

## 2022-11-12 NOTE — Plan of Care (Incomplete)
Problem: IP Suicidal Ideation  Description: Guage endorses suicidal ideation with plan to jump off bridge  Goal: LTG: Sabien will exhibit compliance with therapy  Outcome: Progressing  Xzayvier complies with his medication and attends groups.  Goal: STG: Daxten will not engage in self-injurious activities  Outcome: Progressing  Eliezer denies acting on his passive SI.  Goal: STG: Tim will identify 3 internal coping strategies  Outcome: Progressing  Tarik is working on his Pharmacologist.     Maxten was A+Ox4 with constricted affect and clear speech. He stated he had passive SI but denies acting on it. BVC is 0. He states he is committed to maintaining his safety, encouraged to come to staff if thoughts or urges to harm self or others increases, Dominion verbalizes understanding. He is calm and cooperative with Clinical research associate, able to answer questions and maintain good eye contact. Pt describes his mood as "bad".  He denies issues with  sleep/energy/appetite.  Kane rated his depression 10/10 and anxiety 10/10. Canyon is compliant with medication administration and mouth check completed. Dareon was visible in the milieu throughout the shift and interact with peers and staff appropriately. He was able to rest comfortably and slept for about 5 hours with no respiratory distress noted. Q15 min safety checks maintained. Will continue to monitor and ensure safety.

## 2022-11-12 NOTE — Progress Notes (Signed)
Situation:  Routine follow-up with patient.        Background: See previous spiritual care note(s).          Assessment: Patient said he feels about the same as he did during our conversation yesterday.  He has not received any new information about his liver, which was his main concern yesterday.        Recommendation: Patient aware of chaplain availability for follow-up if desired.        Rev. Jackquline Berlin, M.Div., [facility]  Chaplain, [employer] and [employer], [facility]  [facility] [address]  [phone]  [email or web address]  In emergencies page [phone]

## 2022-11-12 NOTE — Plan of Care (Addendum)
Problem: Pain interferes with ability to perform ADL  Goal: Pain at adequate level as identified by patient  Description: Interventions:  1. Identify patient comfort function goal  2. Evaluate if patient comfort function goal is met  3. Assess pain on admission, during daily assessment and/or before any "as needed" intervention(s)  4. Reassess pain within 30-60 minutes of any procedure/intervention, per Pain Assessment, Intervention, Reassessment (AIR) Cycle  5. Evaluate patient's satisfaction with pain management progress  6. Offer non-pharmacological pain management interventions  7. Consult/collaborate with Pain Service  8. Consult/collaborate with Physical Therapy, Occupational Therapy, and/or Speech Therapy  9. Assess for risk of opioid induced respiratory depression and side effects, including snoring/sleep apnea. Alert healthcare team of risk factors identified.  10. Include patient/patient care companion in decisions related to pain management as needed  Outcome: Not Progressing     Problem: IP Suicidal Ideation  Description: Gary [name] endorses suicidal ideation with plan to jump off bridge  Goal: LTG: Gary [name] will exhibit compliance with therapy  Outcome: Progressing  Gary [name] is compliant to his medication and able to attend groups.  Goal: STG: Gary [name] will not engage in self-injurious activities  Outcome: Progressing  Gary [name] denies acting on his passive SI.     Gary [name] was A+Ox4 with blunt affect and clear speech. He stated he still have passive SI without a plan. He denies HI/AVH. Pt is calm and cooperative with Clinical research associate, able to answer questions and maintain good eye contact. He describes his mood as "bad". He stated that his pain on his right lower abdomen makes his mood bad. He denies issues with sleep or appetite.  Gary [name] rated his depression as 0/10 and anxiety as 8/10. Bladder scan was done and recorded 9 ml.Ultrasound was also done around 0100 hours. Gary [name] is compliant with medication administration and mouth check  completed. Gary [name] was visible in the milieu throughout the shift and interacted with selected peers and staff appropriately. Tylenol 625mg  given as prn for pain at 0203 hours. He was able to rest comfortably and slept for about 4 hours with no respiratory distress noted. Q15 min safety checks maintained. Will continue to monitor and ensure safety.

## 2022-11-12 NOTE — Group Note (Signed)
Group: BH - Group Therapy   Group Topics: Processing   Group Date: 11/12/2022  Start Time: 1330  End Time: 1430  Number of Participants: 5  Facilitators: Tonette Lederer T   Department: [facility] [address]    Discipline Led: BHT  Number of Patient Participants: 5      Name: Gary [name]. Date of Birth: Nov 16, 1980   MR: [id number]      Level of Participation: minimal  Quality of Participation: cooperative  Interactions with Others: supportive  Mood/Affect: bored  Triggers (if applicable): N/A  Cognition: logical  Organization: worked independently  Progress: Minimal  Response: Pt joined for about  5 min of group and left.   Plan: patient will be encouraged to attends and participates in all groups.     Patients Problems:   Patient Active Problem List   Diagnosis    Suicidal ideation

## 2022-11-12 NOTE — Progress Notes (Addendum)
SW spoke with [employer] house worker ([address] [place], [email or web address]   [phone]) who reported the Pt was not accepted into [facility] due to not receiving at least 80% of the votes.  Loraine Leriche stated he would explore other available options in [place] for the Pt.     SW also contacted [facility], (Fax#[phone], Address: [address], [place], [place] [address], [phone]) a mental health residential crisis facility that accepts Pt's with a mental health diagnosis and accepts [place].The initial stay is 10 days, with the possibility of an extension for an additional 10 to 20 days through the Pt's health insurance. They reported the program is voluntary and the Pt would need to agree to stay. SW would have to call to check for bed availability.     SW provided Pt information on [facility] for the Pt to consider as an alternative if [employer] work out.     SW emailed [email or web address] and contacted "Marzetta Merino of [place]" [phone] to inquire about scholarship opportunities for Pt, however no answer.     Pt requested to speak to SW at bedside. The Pt reported he is not happy about switching doctors. He expressed a desire to request a new doctor because he feels this doctor is dismissive and has ben unprofessional, stating the doctor called him by the wrong name and when he corrected him, he giggled. He Pt then stated that he doesn't understand why he doesn't like this doctor, as he typically gets along well with everyone. The Pt also stated he wants to discharge and no longer wishes to stay. The SW was able to encourage the Pt to remain at least until he secures shelter. The SW then validated the Pt's feelings and assured him that it's normal not to connect with everyone, but the key is whether his needs and concerns are being addressed. SW also informed the Pt that she would communicate his concerns to the doctor, which the Pt was receptive to.      The SW then applaud the Pt's commitment and dedication to finding shelter and securing a scholarship, which the Pt appreciated.     Tyna Jaksch, MSW

## 2022-11-12 NOTE — Progress Notes (Signed)
Psychiatry Progress Note  (Level 1 = Problem Focused, Level 2 = Expanded Problem Focused, Level 3 = Detailed)    Patient Name: Gary [name].            Current Date/Time:  7/26/20241:04 PM  MRN:  [id number]                            Attending Physician: Farris Has, MD  DOB: 27-Jun-1980                                Gender: male                              I. History   Informants: Patient, medical record, treatment team   A. Chief Complaint or Reason for Admission       Admitted for worsening depression and suicidal ideation.    B.History of Present Illness: Interval History     (Symptoms and qualifiers:1 for Level 1, 2 for Level 2 and 3+ for Level 3)  Patient is a 42 y.o. divorced, un-domiciled male with history of depression, anxiety, and alcohol use disorder admitted for worsening depression and suicidal ideation with a plan to jump off a bridge. Patient was evaluated by the [facility] and is currently admitted voluntarily.     November 12, 2022  Patient reports that he feels better today than yesterday, but is still mildly anxious and frustrated about his abdominal discomfort, uncertainties about his medical workup, and negative interactions he has had with various staff members. He is tolerating the increased dose of naltrexone well but he does not report any difference in his alcohol cravings. He has been contacting various [employer] and he reports that he is now willing to consider rehab options as well. He denies active SI/HI/AVH today.     C.Medical History  Review of Systems  (Level 1 is none, Level 2 is 1, and Level 3 is 2+)  A complete 14 point ROS was done and was negative except for    Psychiatric: Depression, Anxiety,   Constitutional: craving for alcohol     D.Additional Past Psychiatric, Substance Use, Medical, Family and Social History  Psychiatric: No new information available as compared to previous encounter(s)  Substance Use: No new information available as compared to previous  encounter(s)  Medical: No new information available as compared to previous encounter(s)  Family: No new information available as compared to previous encounter(s)  Social: No new information available as compared to previous encounter(s)    Medications:   Current Medications       Scheduled       Medication Ordered Dose/Rate, Route, Frequency Last Action    buPROPion XL (WELLBUTRIN XL) 24 hr tablet 150 mg    150 mg, PO, Daily Given, 150 mg at 07/26 0902    folic acid (FOLVITE) tablet 1 mg    1 mg, PO, Daily Given, 1 mg at 07/26 0901    gabapentin (NEURONTIN) capsule 400 mg    400 mg, PO, Q8H SCH Given, 400 mg at 07/26 0901    mirtazapine (REMERON) tablet 45 mg    45 mg, PO, QHS Given, 45 mg at 07/26 0203    naltrexone (REVIA) tablet 50 mg    50 mg, PO, Daily Given, 50 mg at 07/26 0901  pantoprazole (PROTONIX) EC tablet 40 mg    40 mg, PO, QAM AC Given, 40 mg at 07/26 0901    polyethylene glycol (MIRALAX) packet 17 g    17 g, PO, Daily Given, 17 g at 07/24 1610    thiamine (VITAMIN B1) tablet 100 mg    100 mg, PO, Daily Given, 100 mg at 07/26 0901    vitamins/minerals tablet 1 tablet    1 tablet, PO, Daily Given, 1 tablet at 07/26 0901              PRN       Medication Ordered Dose/Rate, Route, Frequency Last Action    acetaminophen (TYLENOL) tablet 650 mg    650 mg, PO, Q6H PRN Given, 650 mg at 07/26 1241    alum & mag hydroxide-simethicone (MAALOX PLUS) 200-200-20 mg/5 mL suspension 30 mL    30 mL, PO, Q6H PRN Ordered    diphenhydrAMINE (BENADRYL) capsule 50 mg (Or Linked Group #1)    50 mg, PO, Once PRN Ordered    diphenhydrAMINE (BENADRYL) injection 50 mg (Or Linked Group #1)    50 mg, IM, Once PRN Ordered    hydrOXYzine (VISTARIL) capsule 100 mg    100 mg, PO, Q6H PRN Given, 100 mg at 07/26 1047    ibuprofen (ADVIL) tablet 800 mg    800 mg, PO, Q6H PRN Given, 800 mg at 07/26 1047    QUEtiapine (SEROquel) tablet 25 mg    25 mg, PO, BID PRN Ordered                    II. Examination   Vital signs reviewed:    Blood pressure 124/80, pulse 91, temperature 97.7 F (36.5 C), temperature source Oral, resp. rate 18, height 1.702 m (5\' 7" ), weight 68 kg (150 lb), SpO2 96%.     Mental Status Exam  (Level 1 is 1-5, Level 2 is 6-8, Level 3 is 9+)  General appearance: Appears chronological age, Good hygiene, and Good grooming  Attitude/Behavior: Cooperative, Eye Contact is  Good, and calm  Motor: No abnormalities noted  Gait: No obvious abnormalities  Muscle strength and tone: grossly intact  Speech:   Spontaneous: Yes  Rate and Rhythm: Normal  Volume: Normal  Tone: Normal  Clarity: Yes  Mood:"about the same"  Affect:  Dysthymic    Range: Constricted  Stability: Stable  Appropriateness to thought content: Yes  Intensity: Normal  Thought Process:     Linear    Coherent: Yes  Logical:  Yes  Associations: Goal-directed  Thought Content:   Delusions:  Not elicited  Depressive Cognitions:   Worthless, and Guilt, some hope expressed  Suicidal:  denies today  Homicidal:  No homicidal thoughts  Violent Thoughts:  No  Perceptions:   Hallucinations: No  Insight: Fair  Judgment: Fair  Cognition:   Level of Consciousness: Intact  Orientation: Intact to self, place and time    Psychiatric / Cognitive Instruments: None    Pertinent Physical Exam: Not relevant to current chief complaint/reason for admission    Imaging / EKG / Labs: Labs in the last 24 hours  Results       Procedure Component Value Units Date/Time    Thyroid Stimulating Hormone (TSH) with Reflex to Free T4 [[id number]]  (Normal) Collected: 11/12/22 0811    Specimen: Blood, Venous Updated: 11/12/22 1120     TSH 4.50 uIU/mL     Hepatic Function Panel (LFT) [[id number]]  (Abnormal) Collected: 11/12/22  5409    Specimen: Blood, Venous Updated: 11/12/22 1108     Bilirubin, Total 0.2 mg/dL      Bilirubin Direct 0.1 mg/dL      Bilirubin Indirect 0.1 mg/dL      AST (SGOT) 45 U/L      ALT 121 U/L      Alkaline Phosphatase 75 U/L      Albumin 3.5 g/dL      Globulin 2.6 g/dL       Albumin/Globulin Ratio 1.3     Protein, Total 6.1 g/dL      Hemolysis Index 6 Index     PT/INR [[id number]]  (Normal) Collected: 11/12/22 0811    Specimen: Blood, Venous Updated: 11/12/22 0923     PT 10.9 sec      INR 1.0          EKG Results   Cardiology Results       Procedure Component Value Units Date/Time    ECG 12 lead (Electrocardiogram) [[id number]] Collected: 10/28/22 1740     Updated: 10/28/22 1753     Ventricular Rate 103 BPM      Atrial Rate 103 BPM      P-R Interval 148 ms      QRS Duration 98 ms      Q-T Interval 334 ms      QTC Calculation (Bezet) 437 ms      P Axis 47 degrees      R Axis 49 degrees      T Axis 37 degrees      IHS MUSE NARRATIVE AND IMPRESSION --     SINUS TACHYCARDIA  OTHERWISE NORMAL ECG  NO PREVIOUS ECGS AVAILABLE  Confirmed by Neomia Dear MD, Marek ([id number]) on 10/28/2022 5:53:08 PM      Narrative:      SINUS TACHYCARDIA  OTHERWISE NORMAL ECG  NO PREVIOUS ECGS AVAILABLE  Confirmed by Neomia Dear MD, [email or web address] ([id number]) on 10/28/2022 5:53:08 PM          Brain Scan No results found for any visits on 10/28/22.    III. Assessment and Plan (Medical Decision Making)     1. I certify that this patient continues to require inpatient hospitalization due to acute risk to self and unable to care for self in the community with insufficient support available    2. Psychiatric Diagnoses  Depression, unspecified  Rule out MDD vs substance-induced mood disorder  Social Anxiety Disorder  Alcohol Use Disorder  Rule out PTSD    3. Additional Diagnoses    4.All diagnostic procedures completed since admission were reviewed. Past medical records reviewed. Coordination of care was discussed with inpatient team and as available with the outpatient team.    5. On this admission patient educated about and provided input into their treatment plan.  Patient understands potential risks and benefits of proposed treatment plan.     6.  Assessment / Impression  Patient is a 42 y.o. divorced, un-domiciled male with history of depression,  anxiety, and alcohol use disorder admitted for worsening depression and suicidal ideation with a plan to jump off a bridge. Patient was evaluated by the [facility] and is currently admitted voluntarily.     7/21:   The patient continues to report depressed mood and passive SI, reporting that he cannot identify a reason to live or to remain sober.  He reports minimal improvement in anxiety so far on gabapentin and reports 8/10 depression.  Ongoing cravings for alcohol.  Patient requires inpatient hospitalization for medication  adjustment and safety.      7/22:   continues to report anxious and depressive cognitions , and struggles with alcohol craving. Reported getting relief at moments of taking gabapentin. Today is the first time patient denied struggles with SI. Continue current med dosing; gabapentin was increased yesterday  -pending interview with [employer] (expected this week)    7/23: Patient feeling depressed and hopeless. Apparently, pt was seen by Chaplain for spiritual care, and pt had informed chaplain that he feels like he is being rushed out for discharge, therefore he had been hiding SI when in fact he was feeling  suicidal. Pt would need to be reassured tomorrow in the context of his own struggles.   - [facility]: pt does not sound hopeful about it, but he can have an appt with [facility] that patient can attend post discharge and make a decision about  - SI:  ongoing  - Nephrolithiasis: Internal Medicine consult note appreciated. Pending Urology consult, and repeat CT .     7/24: Patient expressed that he had been minimizing the extent of his depression and suicidality in recent days but he is more forthcoming today. He reports that his mood is low and he continues to endorse passive suicidal thoughts. He is tolerating his current medication regimen but reports minimal benefit and expresses interest in changes. Will discontinue Acamprosate as patient has found this medication unhelpful and shares  that he is unlikely to continue post-discharge. Will start wellbutrin XL 150 mg daily to target depression; patient reported historical benefit from wellbutrin without adverse effects. Will monitor closely for tolerability.     7/25: patient reports irritability, frustration, and ongoing hopelessness with passive SI related to his psychosocial stressors (unstable housing, lack of social support, unemployment). He is tolerating wellbutrin and naltrexone well. Will increase naltrexone to 50 mg daily starting tomorrow for alcohol use disorder, and will increase PRN hydroxyzine from 75 mg to 100 mg q6h PRN for anxiety.     7/26: Patient is tolerating his medication regimen well and he denies SI today. He reports unchanged alcohol cravings but he is increasingly amenable to placement options that may help him achieve sobriety, including rehab. SW currently assisting with safe placement. Will continue current medications at this time.     At this time, the patient would continue to benefit from inpatient psychiatric hospitalization for safety, stabilization, diagnostic clarification, medication management, and discharge planning. SW assisting with placement.     Suicide Risk Assessment  - Passive SI; no active plan nor intent     Plan / Recommendations:  Biological Plan:  Medications:   Mirtazapine 45 mg qhs  Gabapentin 400 mg TID (increased on 7/21)  Wellbutrin XL 150 mg daily  Naltrexone 50 mg daily  Vistaril 100 mg q6h PRN for anxiety  Scheduled Meds:  Current Facility-Administered Medications   Medication Dose Route Frequency    buPROPion XL  150 mg Oral Daily    folic acid  1 mg Oral Daily    gabapentin  400 mg Oral Q8H SCH    mirtazapine  45 mg Oral QHS    naltrexone  50 mg Oral Daily    pantoprazole  40 mg Oral QAM AC    polyethylene glycol  17 g Oral Daily    thiamine  100 mg Oral Daily    vitamins/minerals  1 tablet Oral Daily     Continuous Infusions:  PRN Meds:.acetaminophen, alum & mag hydroxide-simethicone,  diphenhydrAMINE **OR** diphenhydrAMINE, hydrOXYzine, ibuprofen, QUEtiapine  Medical Work-up:  as per unit FNP  Consults: IM and Urology teams consulted and appreciated    Psychosocial Plan:  Individual therapy: Psycho-education and psychotherapy of the following modalities will continue to be provided during daily visits:Supportive, Cognitive Behavioral Therapy, and Insight Oriented Psychotherapy  Group and milieu therapies: daily per unit's schedule.  Continue with Social Work intervention provided for discharge planning including assistance with placement and follow-up psychiatric care.    Plan for Family Involvement: None Available  Other Providers Contact Information and Dates Contacted: No Updates    I personally saw and examined the patient and spent 30 minutes on this visit reviewing previous notes, counseling the patient and/or family members regarding the patient's condition(s), ordering of tests, managing medications, and documenting the findings in the note.   Patient was also seen by Dr. Michaelle Copas. The assessment and plan were discussed and formulated together and has been reviewed and updated as needed.      Signed by: Eldridge Dace, NP  11/12/2022  1:04 PM

## 2022-11-13 MED ORDER — IBUPROFEN 400 MG PO TABS
400.0000 mg | ORAL_TABLET | Freq: Four times a day (QID) | ORAL | Status: DC | PRN
Start: 2022-11-13 — End: 2022-11-17

## 2022-11-13 NOTE — Progress Notes (Signed)
MEDICINE CONSULT FOLLOW UP    Date Time: 11/13/22 11:50 AM  Patient Name: Gary [name],[name]  Requesting Physician: Farris Has, MD  Consulting Physician: Orlie Pollen, MD    Primary Care Physician: Marisa Sprinkles, MD    Reason for Consultation: Abdominal pain      Assessment:     Patient Active Problem List    Diagnosis Date Noted    Suicidal ideation 10/28/2022     42 y/o man with history of nephrolithiasis, alcohol use disorder, low back pain, and depression, admitted for management of depression and suicidal ideation, now with worsening abdominal pain and dysuria, most concerning for persistent or recurrent nephrolithiasis without signs of sepsis. Currently afebrile and hemodynamically stable.    7/27: Abdominal Pain. Etiology not clear.  LFTs elevated so could be alcoholic liver disease. US Abdomen -ve.  PRN Ibuprofen(dose reduced today).  Will f/u LFTs on Monday.    Recommendations:   #Abdominal pain  With history of recent 1.6 cm stone, concern for persistent nephrolithiasis. Patient is still urinating well. Patient has dysuria but otherwise no indication of infection/sepsis (afebrile, no subjective chills, nausea). Low suspicion for acute surgical pathology I.e. testicular torsion, appendicitis, based on exam and history. May also be a component of constipation and gas pain contributing to his abdominal discomfort, given his distention and tympany. Still passing flatus, so low suspicion for complete obstruction  - CBC with diff, CMP, UA ordered  - Follow up CT ab/p  - If <10 mm stone present without significant obstruction, would proceed with pain management, hydration +/- alpha blocker  - If obstruction, significant AKI, or intractable pain, may require urologic intervention  - Encourage PO fluid consumption, goal UOP>2500 mL/day  - Pain management: continue acetaminophen 650 mg PRN, ibuprofen 800 mg q6h PRN   - Added oxycodone 5 mg q6h, one-time dilaudid 1 mg for breakthrough  - Can defer  antibiotics pending UA  - Bowel regimen: Miralax once daily, can increase to BID, recommend adding Pericolace BID PRN  7/24: Appreciate Urology input.  I think the abdominal pain which is mostly in the RUQ and Epigastric region with tenderness as well is probably due to alcoholic liver disease.  I've advised the patient to quit drinking which should help him.  Hep Panel is negative.  F/u Lipase but doubt pancreatitis coz the CT did not show signs of it.  LFTs are elevated, but not the typical Alcohol pattern. Will follow LFTs in 1-2 days.  May get US Liver if pain does not improve or if LFTs trending up.  7/25: Still c/o 9-10/10 Abdominal pain. Pain is out of proportion to signs and symptoms.  Will get a RUQ [place]. F/u LFTs in AM.  I told him to limit the amout of ibuprofen he is using.  7/26: [place] -ve, LFTs still elevated.  Bladder Scan -ve.    #GERD  - Continue PPI    #Alcohol use disorder  - Continue acamprosate, folate, thiamine    #Low back pain  - Continue gabapentin    #Depression  - Per psychiatry team    DVT Ppx: not indicated, patient ambulating  Diet: Regular  Telemetry: Not indicated  Full code    Farris Has, MD, thank you for this consultation.  We will follow the patient with you during this hospitalization. Please contact us with any questions or issues.                History of Presenting Illness:  Gary Abbot Damerion Mcclurg. is a 42 y.o. male with history of nephrolithiasis, low back pain, alcohol use disorder, and depression, admitted to inpatient psychiatry for management of depression with suicidal ideation. Medicine was consulted for concern of abdominal pain and nephrolithiasis.    Of note, he states that he was seen in a [facility] in June for severe abdominal pain that had him "doubled over." He was also having "fire-colored" urine. He underwent a CT ab/p 6/26, which revealed a 1.6 cm right nephrolithiasis with moderate dilation of mid and lower pole calyces. He states that he had a  discussion with a surgeon who counseled him on the risk of infection and kidney problems with a stone that size, but he could not undergo surgery at that time as he was living on the street. He states that he was eventually planning for stone removal in August.    Today, [name] reports that over the past 2 days he has had progressively worsening right-sided abdominal pain that radiates to the middle of his abdomen and to his back. He states that since his hospitalization in June he has had constant pain that has been relatively tolerable but it was always contained to the right side. The pain worsens with stretching and urinating. It is currently an 8/10, but improves to a 6/10 with Motrin. He was able to sleep through the night. He also states that his abdomen appears larger today than usual, and it feels full. He reports 1 week of dysuria; he is still producing his usual amount of urine and it is "crystal clear." States that he still has an appetite and has good PO intake. Denies testicular/scrotal pain. He also notes that he has been somewhat constipated. He typically has 1 soft bowel movement per day but for the past several days has had 2-3 hard, pellet-like bowel movements per day. States that Miralax is not helping. Denies fevers/chills, nausea, vomiting, diarrhea.    He denies history of other medical problems. No history of abdominal surgery. No family history of nephrolithiasis. He does not currently have housing or outpatient medical care.      Past Medical History:     Past Medical History:   Diagnosis Date    Anxiety     Depression        Available old records reviewed, including:  collateral from Epic, Care everywhere including CT ab/p 6/26    Past Surgical History:   History reviewed. No pertinent surgical history.    Family History:   History reviewed. No pertinent family history.    Social History:   Tobacco Use History[1]  Social History     Substance and Sexual Activity   Alcohol Use Yes     Comment: daily- "until I pass out" " Yesterday I had 3 pints of vodka" 10/28/2022     Social History     Substance and Sexual Activity   Drug Use Never       Allergies:   Allergies[2]    Medications:     Home Medications       Med List Status: Complete Set By: Katrinka Blazing, RN at 10/29/2022 12:49 AM              ibuprofen (ADVIL) 800 MG tablet     Take 1 tablet (800 mg) by mouth 3 (three) times daily as needed     omeprazole (PriLOSEC) 40 MG capsule     Take 1 capsule (40 mg) by mouth daily  Current Facility-Administered Medications   Medication Dose Route Frequency    buPROPion XL  150 mg Oral Daily    folic acid  1 mg Oral Daily    gabapentin  400 mg Oral Q8H SCH    mirtazapine  45 mg Oral QHS    naltrexone  50 mg Oral Daily    pantoprazole  40 mg Oral QAM AC    polyethylene glycol  17 g Oral Daily    thiamine  100 mg Oral Daily    vitamins/minerals  1 tablet Oral Daily            Review of Systems:   All other systems were reviewed and are negative unless noted in HPI.    Physical Exam:   Patient Vitals for the past 24 hrs:   BP Temp Temp src Pulse SpO2   11/13/22 0749 105/68 97.7 F (36.5 C) Oral 77 96 %   11/12/22 1835 125/76 98.6 F (37 C) Oral 82 96 %   11/12/22 1440 115/81 98.6 F (37 C) Oral 91 93 %     Body mass index is 23.49 kg/m.  No intake or output data in the 24 hours ending 11/13/22 1150    General: awake, alert, oriented x 3; walking around room in no distress, well-appearing  Cardiovascular: normal rate and regular rhythm, no murmurs appreciated  Lungs: normal effort on room air, good bibasilar air entry, no wheezing, rhonchi, or rales  Abdomen: Normal bowel sounds. RUQ tenderness, no masses or hernias, no rebound or guarding.  Extremities: no edema  Neuro: no lateralizing deficits  Skin: no rashes or lesions noted    Labs:     No results for input(s): "WBC", "HGB", "HCT", "PLT" in the last 72 hours.      No results for input(s): "NA", "K", "CL", "CO2", "BUN", "CREAT", "GLU",  "CA", "MG", "PHOS" in the last 72 hours.      Recent Labs     11/12/22  0811   AST (SGOT) 45*   ALT 121*   Alkaline Phosphatase 75   Protein, Total 6.1   Albumin 3.5       Recent Labs     11/12/22  0811   PT 10.9   INR 1.0       Imaging personally reviewed, including:   US Abdomen Limited Ruq    Result Date: 11/12/2022   No acute abnormality. Nonacute findings detailed above. Johnsie Kindred, MD 11/12/2022 6:40 AM    CT Abdomen Pelvis WO IV/ WO PO Cont    Result Date: 11/09/2022  1. Nonobstructing right nephrolithiasis. 2. Bilateral renal cysts. Georgiana Spinner, MD 11/09/2022 12:48 PM         Orlie Pollen, MD  11/13/2022  11:50 AM      cc: Farris Has, MD  Pcp, None, MD         [1]   Social History  Tobacco Use   Smoking Status Never   Smokeless Tobacco Never   [2]   Allergies  Allergen Reactions    Abilify [Aripiprazole]     Effexor [Venlafaxine]     Haldol [Haloperidol]     Lithium     Paxil [Paroxetine]     Prozac [Fluoxetine]     Vivitrol [Naltrexone]

## 2022-11-13 NOTE — Plan of Care (Addendum)
Problem: Pain interferes with ability to perform ADL  Goal: Pain at adequate level as identified by patient  Description: Interventions:  Outcome: Progressing   Gary [name]'s pain has been relieved with prn medication and does not interfere with his ADLs  Problem: IP Suicidal Ideation  Description: Gary [name] endorses suicidal ideation with plan to jump off bridge  Goal: LTG: [name] will exhibit compliance with therapy  Outcome: Progressing  Gary [name] is compliant with therapy and medication  Goal: STG: Gary [name] will not engage in self-injurious activities  Outcome: Progressing      Gary [name] has not engaged in any self-harm behavior        Gary [name] was A+Ox4, presents with constricted affect and clear speech. Denies HI/AVH/SIB urges, endorses passive SI, no intent, he stated "I don't focus on it". BVC is 0. Pt encouraged to come to staff if thoughts or urges to harm self or others worsens. Pt is calm and cooperative with Clinical research associate, able to answer questions and maintain eye contact. Pt describes his mood as "okay". Denies issues with sleep/energy. Pt reports decreased/suppressed appetite and he thinks it is due to Naltrexone medication, he verbalized that he would decline taking it and would like to discuss concern with provider.   Gary [name] rated his depression as 10/10 and anxiety as 8/10. Pt was provided with emotional support. Gary [name] was compliant with medication administration and mouth check completed. He requested prn Gary [name] for anxiety and Tylenol for complaints of 8/10 abdominal pain with moderate relief. Gary [name] was intermittently visible in the milieu, interacted with peers and staff appropriately. Q15 min safety checks maintained. Will continue to monitor and ensure safety.    Pt observed in bed, eyes closed, no apparent distress noted, slept for 6 hours.

## 2022-11-13 NOTE — Plan of Care (Signed)
Problem: IP Suicidal Ideation  Description: Gary [name] endorses suicidal ideation with plan to jump off bridge  Goal: LTG: Gary [name] will exhibit compliance with therapy  Outcome: Progressing  Pt   attended   creative therapy,compliant with medication.  Goal: STG: Gary [name] will not engage in self-injurious activities  Outcome: Progressing  Pt  did not engaged  in self injurious behavior  Goal: STG: Gary [name] will identify 3 internal coping strategies  Outcome: Progressing  Gary [name]  identified  watching TV ,WALKING ON THE UNIT, BEING CALM.   Gary [name] was A+Ox4 with  constricted  affect and  clear speech. Denies SI/HI/AVH/SIB urges. BVC is 0. Pt states they is  committed to maintaining their safety, encouraged to come to staff if thoughts or urges to harm self or others increases, Gary [name] verbalizes understanding. Pt is calm and cooperative with Clinical research associate, able to answer questions and maintain  good  eye contact. Pt describes their mood as  depressed.,because  placement,pt was  told Child psychotherapist ,working  on  her  disposition.Report   good sleep,  good  energy/appetite.  Gary [name] rated their depression as 8/10  and anxiety as 9/10  (with 10 being the highest possible value). Gary [name] 100 mg given Gary [name] states their goal for today is  attend  groups. Gary [name]  was  compliant with medication administration and mouth check completed. Gary [name] was  visible in the milieu throughout the shift,  attend  and participated in groups, and  intereact with peers and staff appropriately. Q15 min safety checks maintained. Will continue to monitor and ensure safety.

## 2022-11-13 NOTE — Progress Notes (Signed)
Psychiatry Progress Note  (Level 1 = Problem Focused, Level 2 = Expanded Problem Focused, Level 3 = Detailed)    Patient Name: Gary [name].            Current Date/Time:  7/27/20249:58 AM  MRN:  [id number]                            Attending Physician: Farris Has, MD  DOB: Apr 19, 1981                                Gender: male                              I. History   Informants: Patient, medical record, treatment team   A. Chief Complaint or Reason for Admission       Admitted for worsening depression and suicidal ideation.    B.History of Present Illness: Interval History     (Symptoms and qualifiers:1 for Level 1, 2 for Level 2 and 3+ for Level 3)  Patient is a 42 y.o. divorced, un-domiciled male with history of depression, anxiety, and alcohol use disorder admitted for worsening depression and suicidal ideation with a plan to jump off a bridge. Patient was evaluated by the [facility] and is currently admitted voluntarily.     November 13, 2022  Patient rounded at mid-morning.    Sleep:slept fairly   Mood: "anxious"  Negative thoughts: struggling with hope to get housing. We talked about negative perceptions about staff , and how that is source from his own negative cognitions.     Anxiety:8/10  SI: "passive". Denied any self harm thoughts.  Craving alcohol: ongoing. Not hopeful about naltrexone, but when asked few questions, pt reported previously was consuming alcohol while taking naltrexone. Pt was encouraged to continue taking naltrexone while sober at this time.     showers daily  PRN vistaril      Goal for today:  - hydrate 2 jugs of water  - attend art-therapy groups    C.Medical History  Review of Systems  (Level 1 is none, Level 2 is 1, and Level 3 is 2+)  A complete 14 point ROS was done and was negative except for    Psychiatric: Depression, Anxiety, passive SI  Constitutional: craving for alcohol     D.Additional Past Psychiatric, Substance Use, Medical, Family and Social History  Psychiatric: No  new information available as compared to previous encounter(s)  Substance Use: No new information available as compared to previous encounter(s)  Medical: No new information available as compared to previous encounter(s)  Family: No new information available as compared to previous encounter(s)  Social: No new information available as compared to previous encounter(s)    II. Examination   Vital signs reviewed:   Blood pressure 105/68, pulse 77, temperature 97.7 F (36.5 C), temperature source Oral, resp. rate 18, height 1.702 m (5\' 7" ), weight 68 kg (150 lb), SpO2 96%.     Mental Status Exam  (Level 1 is 1-5, Level 2 is 6-8, Level 3 is 9+)  General appearance: Appears chronological age, Good hygiene, and Good grooming  Attitude/Behavior: Cooperative, Eye Contact is  Good, and calm  Motor: No abnormalities noted  Gait: No obvious abnormalities  Muscle strength and tone: grossly intact  Speech:   Spontaneous: Yes  Rate  and Rhythm: Normal  Volume: Normal  Tone: Normal  Clarity: Yes  Mood: "I am hopeless sometimes"  Affect:  Dysthymic    Range: Constricted  Stability: Stable  Appropriateness to thought content: Yes  Intensity: Normal  Thought Process:   Coherent: Yes  Logical:  Yes  Associations: Circumstantial  Thought Content:   Delusions:  Not elicited  Depressive Cognitions:   Worthless, and Guilt  Suicidal:  passive SI  Homicidal:  No homicidal thoughts  Violent Thoughts:  No  Perceptions:   Hallucinations: No  Insight: Fair  Judgment: Fair  Cognition:   Level of Consciousness: Intact  Orientation: Intact to self, place and time    Psychiatric / Cognitive Instruments: None    Pertinent Physical Exam: Not relevant to current chief complaint/reason for admission    Imaging / EKG / Labs: Labs in the last 24 hours  Results       ** No results found for the last 24 hours. **          EKG Results   Cardiology Results       Procedure Component Value Units Date/Time    ECG 12 lead (Electrocardiogram) [[id number]]  Collected: 10/28/22 1740     Updated: 10/28/22 1753     Ventricular Rate 103 BPM      Atrial Rate 103 BPM      P-R Interval 148 ms      QRS Duration 98 ms      Q-T Interval 334 ms      QTC Calculation (Bezet) 437 ms      P Axis 47 degrees      R Axis 49 degrees      T Axis 37 degrees      [facility] MUSE NARRATIVE AND IMPRESSION --     SINUS TACHYCARDIA  OTHERWISE NORMAL ECG  NO PREVIOUS ECGS AVAILABLE  Confirmed by Neomia Dear MD, Camp ([id number]) on 10/28/2022 5:53:08 PM      Narrative:      SINUS TACHYCARDIA  OTHERWISE NORMAL ECG  NO PREVIOUS ECGS AVAILABLE  Confirmed by Neomia Dear MD, Abdulhadi ([id number]) on 10/28/2022 5:53:08 PM          Brain Scan No results found for any visits on 10/28/22.    III. Assessment and Plan (Medical Decision Making)     1. I certify that this patient continues to require inpatient hospitalization due to acute risk to self and unable to care for self in the community with insufficient support available    2. Psychiatric Diagnoses  Depression, unspecified  Rule out MDD vs substance-induced mood disorder  Social Anxiety Disorder  Alcohol Use Disorder  Rule out PTSD    3. Additional Diagnoses    4.All diagnostic procedures completed since admission were reviewed. Past medical records reviewed. Coordination of care was discussed with inpatient team and as available with the outpatient team.    5. On this admission patient educated about and provided input into their treatment plan.  Patient understands potential risks and benefits of proposed treatment plan.     6.  Assessment / Impression  Patient is a 42 y.o. divorced, un-domiciled male with history of depression, anxiety, and alcohol use disorder admitted for worsening depression and suicidal ideation with a plan to jump off a bridge. Patient was evaluated by the [facility] and is currently admitted voluntarily.     7/21:   The patient continues to report depressed mood and passive SI, reporting that he cannot identify a reason to live or  to remain sober.  He reports minimal  improvement in anxiety so far on gabapentin and reports 8/10 depression.  Ongoing cravings for alcohol.  Patient requires inpatient hospitalization for medication adjustment and safety.      7/22:   continues to report anxious and depressive cognitions , and struggles with alcohol craving. Reported getting relief at moments of taking gabapentin. Today is the first time patient denied struggles with SI. Continue current med dosing; gabapentin was increased yesterday  -pending interview with [employer] (expected this week)    7/23: Patient feeling depressed and hopeless. Apparently, pt was seen by Chaplain for spiritual care, and pt had informed chaplain that he feels like he is being rushed out for discharge, therefore he had been hiding SI when in fact he was feeling  suicidal. Pt would need to be reassured tomorrow in the context of his own struggles.   - [facility]: pt does not sound hopeful about it, but he can have an appt with [facility] that patient can attend post discharge and make a decision about  - SI:  ongoing  - Nephrolithiasis: Internal Medicine consult note appreciated. Pending Urology consult, and repeat CT .     7/24: Patient expressed that he had been minimizing the extent of his depression and suicidality in recent days but he is more forthcoming today. He reports that his mood is low and he continues to endorse passive suicidal thoughts. He is tolerating his current medication regimen but reports minimal benefit and expresses interest in changes. Will discontinue Acamprosate as patient has found this medication unhelpful and shares that he is unlikely to continue post-discharge. Will start wellbutrin XL 150 mg daily to target depression; patient reported historical benefit from wellbutrin without adverse effects. Will monitor closely for tolerability.     7/25: patient reports irritability, frustration, and ongoing hopelessness with passive SI related to his psychosocial stressors  (unstable housing, lack of social support, unemployment). He is tolerating wellbutrin and naltrexone well. Will increase naltrexone to 50 mg daily starting tomorrow for alcohol use disorder, and will increase PRN hydroxyzine from 75 mg to 100 mg q6h PRN for anxiety.     7/26: Patient is tolerating his medication regimen well and he denies SI today. He reports unchanged alcohol cravings but he is increasingly amenable to placement options that may help him achieve sobriety, including rehab. SW currently assisting with safe placement. Will continue current medications at this time.     7/27: Some frustrations noted with attending changes the previous day to accommodate another client, but patient was able to process it and not take it personally. Passive SI expressed; no active plans to harm self  - tolerating meds. Pt educated to watch out  for nausea while on naltrexone, and can take it after meals. Plans to go to creative art therapy groups.     At this time, the patient would continue to benefit from inpatient psychiatric hospitalization for safety, stabilization, diagnostic clarification, medication management, and discharge planning. SW assisting with placement.     Suicide Risk Assessment  - Passive SI; no active plan nor intent     Plan / Recommendations:  Biological Plan:  Medications:   Mirtazapine 45 mg qhs  Gabapentin 400 mg TID (increased on 7/21)  Wellbutrin XL 150 mg daily  Naltrexone 50 mg daily  Vistaril 100 mg q6h PRN for anxiety  Scheduled Meds:  Current Facility-Administered Medications   Medication Dose Route Frequency    buPROPion XL  150  mg Oral Daily    folic acid  1 mg Oral Daily    gabapentin  400 mg Oral Q8H SCH    mirtazapine  45 mg Oral QHS    naltrexone  50 mg Oral Daily    pantoprazole  40 mg Oral QAM AC    polyethylene glycol  17 g Oral Daily    thiamine  100 mg Oral Daily    vitamins/minerals  1 tablet Oral Daily     Continuous Infusions:  PRN Meds:.acetaminophen, alum & mag  hydroxide-simethicone, diphenhydrAMINE **OR** diphenhydrAMINE, hydrOXYzine, ibuprofen, QUEtiapine    Medical Work-up:  as per unit FNP  Consults: IM and Urology teams consulted and appreciated    Psychosocial Plan:  Individual therapy: Psycho-education and psychotherapy of the following modalities will continue to be provided during daily visits:Supportive, Cognitive Behavioral Therapy, and Insight Oriented Psychotherapy  Group and milieu therapies: daily per unit's schedule.  Continue with Social Work intervention provided for discharge planning including assistance with placement and follow-up psychiatric care.    Plan for Family Involvement: None Available  Other Providers Contact Information and Dates Contacted: No Updates    I personally saw and examined the patient and spent 29 minutes on this visit reviewing previous notes, counseling the patient and/or family members regarding the patient's condition(s), ordering of tests, managing medications, and documenting the findings in the note.     Signed by: Cecille Rubin, NP  11/13/2022  12:12 PM

## 2022-11-13 NOTE — Group Note (Addendum)
Group: BH - Creative  Group Topics: [employer]   Group Date: 11/13/2022  Start Time: 1600  End Time: 1700  Number of Participants: 10  Facilitators: Estelle Grumbles   Department: [facility] [address] [place]    Discipline Led: BHT  Number of Patient Participants: 10      Name: Gary [name]. Date of Birth: 05-26-1980   MR: [id number]      Level of Participation: moderate  Quality of Participation: attentive and cooperative   Interactions with Others: sat apart from others, little interaction with peers noted, chatted with TW  Mood/Affect brightens on interaction   Triggers (if applicable): N/A  Cognition: coherent/clear and goal directed  Organization: UTA  Progress: Moderate  Response: Patient listened to music. Chatted with TW some of the time. Did not want to work on an Event organiser.   Plan: patient will be encouraged to continue to attend groups     Patients Problems:   Patient Active Problem List   Diagnosis    Suicidal ideation

## 2022-11-14 MED ORDER — ONDANSETRON 4 MG PO TBDP
4.0000 mg | ORAL_TABLET | Freq: Three times a day (TID) | ORAL | Status: DC | PRN
Start: 2022-11-14 — End: 2022-11-17

## 2022-11-14 MED ORDER — POLYETHYLENE GLYCOL 3350 17 G PO PACK
17.0000 g | PACK | Freq: Every day | ORAL | Status: DC | PRN
Start: 2022-11-14 — End: 2022-11-17

## 2022-11-14 NOTE — Progress Notes (Signed)
MEDICINE CONSULT FOLLOW UP    Date Time: 11/14/22 12:01 PM  Patient Name: Gary [name],[name]  Requesting Physician: Farris Has, MD  Consulting Physician: Orlie Pollen, MD    Primary Care Physician: Marisa Sprinkles, MD    Reason for Consultation: Abdominal pain      Assessment:     Patient Active Problem List    Diagnosis Date Noted    Suicidal ideation 10/28/2022     42 y/o man with history of nephrolithiasis, alcohol use disorder, low back pain, and depression, admitted for management of depression and suicidal ideation, now with worsening abdominal pain and dysuria, most concerning for persistent or recurrent nephrolithiasis without signs of sepsis. Currently afebrile and hemodynamically stable.    7/27: Abdominal Pain. Etiology not clear.  LFTs elevated so could be alcoholic liver disease. US Abdomen -ve.  PRN Ibuprofen(dose reduced today).  Will f/u LFTs on Monday.  7/28: No new complaints. F/u LFTs in AM    Recommendations:   #Abdominal pain  With history of recent 1.6 cm stone, concern for persistent nephrolithiasis. Patient is still urinating well. Patient has dysuria but otherwise no indication of infection/sepsis (afebrile, no subjective chills, nausea). Low suspicion for acute surgical pathology I.e. testicular torsion, appendicitis, based on exam and history. May also be a component of constipation and gas pain contributing to his abdominal discomfort, given his distention and tympany. Still passing flatus, so low suspicion for complete obstruction  - CBC with diff, CMP, UA ordered  - Follow up CT ab/p  - If <10 mm stone present without significant obstruction, would proceed with pain management, hydration +/- alpha blocker  - If obstruction, significant AKI, or intractable pain, may require urologic intervention  - Encourage PO fluid consumption, goal UOP>2500 mL/day  - Pain management: continue acetaminophen 650 mg PRN, ibuprofen 800 mg q6h PRN   - Added oxycodone 5 mg q6h, one-time dilaudid  1 mg for breakthrough  - Can defer antibiotics pending UA  - Bowel regimen: Miralax once daily, can increase to BID, recommend adding Pericolace BID PRN  7/24: Appreciate Urology input.  I think the abdominal pain which is mostly in the RUQ and Epigastric region with tenderness as well is probably due to alcoholic liver disease.  I've advised the patient to quit drinking which should help him.  Hep Panel is negative.  F/u Lipase but doubt pancreatitis coz the CT did not show signs of it.  LFTs are elevated, but not the typical Alcohol pattern. Will follow LFTs in 1-2 days.  May get US Liver if pain does not improve or if LFTs trending up.  7/25: Still c/o 9-10/10 Abdominal pain. Pain is out of proportion to signs and symptoms.  Will get a RUQ [place]. F/u LFTs in AM.  I told him to limit the amout of ibuprofen he is using.  7/26: [place] -ve, LFTs still elevated.  Bladder Scan -ve.    #GERD  - Continue PPI    #Alcohol use disorder  - Continue acamprosate, folate, thiamine    #Low back pain  - Continue gabapentin    #Depression  - Per psychiatry team    DVT Ppx: not indicated, patient ambulating  Diet: Regular  Telemetry: Not indicated  Full code    Farris Has, MD, thank you for this consultation.  We will follow the patient with you during this hospitalization. Please contact us with any questions or issues.  History of Presenting Illness:   Gary [name]. is a 42 y.o. male with history of nephrolithiasis, low back pain, alcohol use disorder, and depression, admitted to inpatient psychiatry for management of depression with suicidal ideation. Medicine was consulted for concern of abdominal pain and nephrolithiasis.    Of note, he states that he was seen in a [facility] in June for severe abdominal pain that had him "doubled over." He was also having "fire-colored" urine. He underwent a CT ab/p 6/26, which revealed a 1.6 cm right nephrolithiasis with moderate dilation of mid and lower pole  calyces. He states that he had a discussion with a surgeon who counseled him on the risk of infection and kidney problems with a stone that size, but he could not undergo surgery at that time as he was living on the street. He states that he was eventually planning for stone removal in August.    Today, [name] reports that over the past 2 days he has had progressively worsening right-sided abdominal pain that radiates to the middle of his abdomen and to his back. He states that since his hospitalization in June he has had constant pain that has been relatively tolerable but it was always contained to the right side. The pain worsens with stretching and urinating. It is currently an 8/10, but improves to a 6/10 with Motrin. He was able to sleep through the night. He also states that his abdomen appears larger today than usual, and it feels full. He reports 1 week of dysuria; he is still producing his usual amount of urine and it is "crystal clear." States that he still has an appetite and has good PO intake. Denies testicular/scrotal pain. He also notes that he has been somewhat constipated. He typically has 1 soft bowel movement per day but for the past several days has had 2-3 hard, pellet-like bowel movements per day. States that Miralax is not helping. Denies fevers/chills, nausea, vomiting, diarrhea.    He denies history of other medical problems. No history of abdominal surgery. No family history of nephrolithiasis. He does not currently have housing or outpatient medical care.      Past Medical History:     Past Medical History:   Diagnosis Date    Anxiety     Depression        Available old records reviewed, including:  collateral from Epic, Care everywhere including CT ab/p 6/26    Past Surgical History:   History reviewed. No pertinent surgical history.    Family History:   History reviewed. No pertinent family history.    Social History:   Tobacco Use History[1]  Social History     Substance and Sexual  Activity   Alcohol Use Yes    Comment: daily- "until I pass out" " Yesterday I had 3 pints of vodka" 10/28/2022     Social History     Substance and Sexual Activity   Drug Use Never       Allergies:   Allergies[2]    Medications:     Home Medications       Med List Status: Complete Set By: Katrinka Blazing, RN at 10/29/2022 12:49 AM              ibuprofen (ADVIL) 800 MG tablet     Take 1 tablet (800 mg) by mouth 3 (three) times daily as needed     omeprazole (PriLOSEC) 40 MG capsule     Take 1 capsule (40  mg) by mouth daily            Current Facility-Administered Medications   Medication Dose Route Frequency    buPROPion XL  150 mg Oral Daily    folic acid  1 mg Oral Daily    gabapentin  400 mg Oral Q8H SCH    mirtazapine  45 mg Oral QHS    naltrexone  50 mg Oral Daily    pantoprazole  40 mg Oral QAM AC    thiamine  100 mg Oral Daily    vitamins/minerals  1 tablet Oral Daily            Review of Systems:   All other systems were reviewed and are negative unless noted in HPI.    Physical Exam:   Patient Vitals for the past 24 hrs:   BP Temp Temp src Pulse Resp SpO2   11/14/22 0637 104/70 97.9 F (36.6 C) Oral 73 -- 97 %   11/13/22 1942 127/76 98.2 F (36.8 C) Oral (!) 104 16 96 %   11/13/22 1507 126/80 98.8 F (37.1 C) Oral 93 -- 96 %     Body mass index is 23.49 kg/m.  No intake or output data in the 24 hours ending 11/14/22 1201    General: awake, alert, oriented x 3; walking around room in no distress, well-appearing  Cardiovascular: normal rate and regular rhythm, no murmurs appreciated  Lungs: normal effort on room air, good bibasilar air entry, no wheezing, rhonchi, or rales  Abdomen: Normal bowel sounds. RUQ tenderness, no masses or hernias, no rebound or guarding.  Extremities: no edema  Neuro: no lateralizing deficits  Skin: no rashes or lesions noted    Labs:     No results for input(s): "WBC", "HGB", "HCT", "PLT" in the last 72 hours.      No results for input(s): "NA", "K", "CL", "CO2", "BUN",  "CREAT", "GLU", "CA", "MG", "PHOS" in the last 72 hours.      Recent Labs     11/12/22  0811   AST (SGOT) 45*   ALT 121*   Alkaline Phosphatase 75   Protein, Total 6.1   Albumin 3.5       Recent Labs     11/12/22  0811   PT 10.9   INR 1.0       Imaging personally reviewed, including:   US Abdomen Limited Ruq    Result Date: 11/12/2022   No acute abnormality. Nonacute findings detailed above. Johnsie Kindred, MD 11/12/2022 6:40 AM    CT Abdomen Pelvis WO IV/ WO PO Cont    Result Date: 11/09/2022  1. Nonobstructing right nephrolithiasis. 2. Bilateral renal cysts. Georgiana Spinner, MD 11/09/2022 12:48 PM         Orlie Pollen, MD  11/14/2022  12:01 PM      cc: Farris Has, MD  Pcp, None, MD         [1]   Social History  Tobacco Use   Smoking Status Never   Smokeless Tobacco Never   [2]   Allergies  Allergen Reactions    Abilify [Aripiprazole]     Effexor [Venlafaxine]     Haldol [Haloperidol]     Lithium     Paxil [Paroxetine]     Prozac [Fluoxetine]     Vivitrol [Naltrexone]

## 2022-11-14 NOTE — Plan of Care (Addendum)
Problem: IP Suicidal Ideation  Description: Gary [name] endorses suicidal ideation with plan to jump off bridge  Goal: LTG: Gary [name] will exhibit compliance with therapy  Outcome: Progressing  Gary [name]  interacted with treatment team and takes meds prescribed for him.Engaged with team members  Goal: STG: Gary [name] will not engage in self-injurious activities  Outcome: Progressing  Gary [name] has not engaged in SIB .   Gary [name] was A+Ox4 with   constricted  affect and  clear  speech. Denies SI/HI/AVH/SIB urges. BVC is 0. Pt states   he  is committed to maintaining their safety, encouraged to come to staff if thoughts or urges to harm self or others increases, Gary [name] verbalizes understanding. Pt is  calm and  and cooperative with Clinical research associate, able to answer questions and maintain  good  eye contact. Pt describes  his mood as  good . Report  good sleep/energy/appetite.  Gary [name] rated  his depression as  5/10and anxiety as 4/10 (with 10 being the highest possible value). Gary [name] states  his goal for today is attend   group. Gary [name]  is  compliant with medication administration and mouth check completed. Gary [name] was  seen  in the milieu throughout the shift, attended  and participated in groups, and  interacted  with peers and staff appropriately.  [name] complained  of indigestion  Maalox given   with good  effect Q15 min safety checks maintained. Will continue to monitor and ensure safety.

## 2022-11-14 NOTE — Progress Notes (Signed)
+/Psychiatry Progress Note  (Level 1 = Problem Focused, Level 2 = Expanded Problem Focused, Level 3 = Detailed)    Patient Name: Gary [name].            Current Date/Time:  7/28/202411:17 AM  MRN:  [id number]                            Attending Physician: Farris Has, MD  DOB: Apr 05, 1981                                Gender: male                              I. History   Informants: Patient, medical record, treatment team   A. Chief Complaint or Reason for Admission       Admitted for worsening depression and suicidal ideation.    B.History of Present Illness: Interval History     (Symptoms and qualifiers:1 for Level 1, 2 for Level 2 and 3+ for Level 3)  Patient is a 42 y.o. divorced, un-domiciled male with history of depression, anxiety, and alcohol use disorder admitted for worsening depression and suicidal ideation with a plan to jump off a bridge. Patient was evaluated by the [facility] and is currently admitted voluntarily.     November 14, 2022  Chart reviewed.    Rounded at 10am; pt was found taking a nap  At 11:15am; pt was found sitting in the dining area and talking to patients.     Slept at night  Mood: still anxious  Depressive thoughts: hopelessness; talked about his natural tendencies to not doing well with change    Anxiety: reported having a moment of shakiness yesterday, but not today  SI: "often passive thoughts". Denied intent or plan to harm self   Craving alcohol: talking about it with unit therapist brings up emotions, and that increases craving and desire to drink.     showers daily  PRN vistaril  Appreciates wearing clothing donated from the unit, and how that improves his self-esteem    Goal for today:  - attend art-therapy groups  - hydrate,   -interact with others on the unit    C.Medical History  Review of Systems  (Level 1 is none, Level 2 is 1, and Level 3 is 2+)  A complete 14 point ROS was done and was negative except for    Psychiatric: Depression, Anxiety, passive  SI  Constitutional: craving for alcohol     D.Additional Past Psychiatric, Substance Use, Medical, Family and Social History  Psychiatric: No new information available as compared to previous encounter(s)  Substance Use: No new information available as compared to previous encounter(s)  Medical: No new information available as compared to previous encounter(s)  Family: No new information available as compared to previous encounter(s)  Social: No new information available as compared to previous encounter(s)    II. Examination   Vital signs reviewed:   Blood pressure 104/70, pulse 73, temperature 97.9 F (36.6 C), temperature source Oral, resp. rate 16, height 1.702 m (5\' 7" ), weight 68 kg (150 lb), SpO2 97%.     Mental Status Exam  (Level 1 is 1-5, Level 2 is 6-8, Level 3 is 9+)  General appearance: Appears chronological age, Good hygiene, and Good grooming  Attitude/Behavior: Cooperative, [place]  Contact is  Good, and calm  Motor: No abnormalities noted  Gait: No obvious abnormalities  Muscle strength and tone: grossly intact  Speech:   Spontaneous: Yes  Rate and Rhythm: Normal  Volume: Normal  Tone: Normal  Clarity: Yes  Mood: "anxious"  Affect:  Dysthymic    Range: Constricted  Stability: Stable  Appropriateness to thought content: Yes  Intensity: Normal  Thought Process:   Coherent: Yes  Logical:  Yes  Associations: Circumstantial  Thought Content:   Delusions:  Not elicited  Depressive Cognitions:   reported ongoing struggle with hopelessness  Suicidal:  passive SI  Homicidal:  No homicidal thoughts  Violent Thoughts:  No  Perceptions:   Hallucinations: No  Insight: Fair  Judgment: Fair  Cognition:   Level of Consciousness: Intact  Orientation: Intact to self, place and time    Psychiatric / Cognitive Instruments: None    Pertinent Physical Exam: Not relevant to current chief complaint/reason for admission    Imaging / EKG / Labs: Labs in the last 24 hours  Results       ** No results found for the last 24 hours.  **          EKG Results   Cardiology Results       Procedure Component Value Units Date/Time    ECG 12 lead (Electrocardiogram) [[id number]] Collected: 10/28/22 1740     Updated: 10/28/22 1753     Ventricular Rate 103 BPM      Atrial Rate 103 BPM      P-R Interval 148 ms      QRS Duration 98 ms      Q-T Interval 334 ms      QTC Calculation (Bezet) 437 ms      P Axis 47 degrees      R Axis 49 degrees      T Axis 37 degrees      IHS MUSE NARRATIVE AND IMPRESSION --     SINUS TACHYCARDIA  OTHERWISE NORMAL ECG  NO PREVIOUS ECGS AVAILABLE  Confirmed by Neomia Dear MD, Jamoni ([id number]) on 10/28/2022 5:53:08 PM      Narrative:      SINUS TACHYCARDIA  OTHERWISE NORMAL ECG  NO PREVIOUS ECGS AVAILABLE  Confirmed by Neomia Dear MD, Salah ([id number]) on 10/28/2022 5:53:08 PM          Brain Scan No results found for any visits on 10/28/22.    III. Assessment and Plan (Medical Decision Making)     1. I certify that this patient continues to require inpatient hospitalization due to acute risk to self and unable to care for self in the community with insufficient support available    2. Psychiatric Diagnoses  Depression, unspecified  Rule out MDD vs substance-induced mood disorder  Social Anxiety Disorder  Alcohol Use Disorder  Rule out PTSD    3. Additional Diagnoses    4.All diagnostic procedures completed since admission were reviewed. Past medical records reviewed. Coordination of care was discussed with inpatient team and as available with the outpatient team.    5. On this admission patient educated about and provided input into their treatment plan.  Patient understands potential risks and benefits of proposed treatment plan.     6.  Assessment / Impression  Patient is a 42 y.o. divorced, un-domiciled male with history of depression, anxiety, and alcohol use disorder admitted for worsening depression and suicidal ideation with a plan to jump off a bridge. Patient was evaluated by the [facility] and is  currently admitted voluntarily.     7/21:   The patient  continues to report depressed mood and passive SI, reporting that he cannot identify a reason to live or to remain sober.  He reports minimal improvement in anxiety so far on gabapentin and reports 8/10 depression.  Ongoing cravings for alcohol.  Patient requires inpatient hospitalization for medication adjustment and safety.      7/22:   continues to report anxious and depressive cognitions , and struggles with alcohol craving. Reported getting relief at moments of taking gabapentin. Today is the first time patient denied struggles with SI. Continue current med dosing; gabapentin was increased yesterday  -pending interview with [employer] (expected this week)    7/23: Patient feeling depressed and hopeless. Apparently, pt was seen by Chaplain for spiritual care, and pt had informed chaplain that he feels like he is being rushed out for discharge, therefore he had been hiding SI when in fact he was feeling  suicidal. Pt would need to be reassured tomorrow in the context of his own struggles.   - [facility]: pt does not sound hopeful about it, but he can have an appt with [facility] that patient can attend post discharge and make a decision about  - SI:  ongoing  - Nephrolithiasis: Internal Medicine consult note appreciated. Pending Urology consult, and repeat CT .     7/24: Patient expressed that he had been minimizing the extent of his depression and suicidality in recent days but he is more forthcoming today. He reports that his mood is low and he continues to endorse passive suicidal thoughts. He is tolerating his current medication regimen but reports minimal benefit and expresses interest in changes. Will discontinue Acamprosate as patient has found this medication unhelpful and shares that he is unlikely to continue post-discharge. Will start wellbutrin XL 150 mg daily to target depression; patient reported historical benefit from wellbutrin without adverse effects. Will monitor closely for  tolerability.     7/25: patient reports irritability, frustration, and ongoing hopelessness with passive SI related to his psychosocial stressors (unstable housing, lack of social support, unemployment). He is tolerating wellbutrin and naltrexone well. Will increase naltrexone to 50 mg daily starting tomorrow for alcohol use disorder, and will increase PRN hydroxyzine from 75 mg to 100 mg q6h PRN for anxiety.     7/26: Patient is tolerating his medication regimen well and he denies SI today. He reports unchanged alcohol cravings but he is increasingly amenable to placement options that may help him achieve sobriety, including rehab. SW currently assisting with safe placement. Will continue current medications at this time.     7/27: Some frustrations noted with attending changes the previous day to accommodate another client, but patient was able to process it and not take it personally. Passive SI expressed; no active plans to harm self  - tolerating meds. Pt educated to watch out  for nausea while on naltrexone, and can take it after meals. Plans to go to creative art therapy groups.     7/28: patient agreed to take his naltrexone dose from this AM. The shakiness he felt yesterday most likey due to anxiety. No nausea (usually the effect of naltrexone). Lower appetite may not be exclusively due to naltrexone.   - pt educated to continually be reminded that he can't take opioids in the future while on naltrexone,   -CMP tomorrow, per Internal Medicine team.    - appreciate internal medicine follow ups. Pt appreciative that RUQ  Ultra sound was negative  At this time, the patient would continue to benefit from inpatient psychiatric hospitalization for safety, stabilization, diagnostic clarification, medication management, and discharge planning. SW assisting with placement.     Suicide Risk Assessment  - Passive SI; no active plan nor intent     Plan / Recommendations:  Biological Plan:  Medications:   Mirtazapine 45  mg qhs  Gabapentin 400 mg TID (increased on 7/21)  Wellbutrin XL 150 mg daily  Naltrexone 50 mg daily  Vistaril 100 mg q6h PRN for anxiety  Scheduled Meds:  Current Facility-Administered Medications   Medication Dose Route Frequency    buPROPion XL  150 mg Oral Daily    folic acid  1 mg Oral Daily    gabapentin  400 mg Oral Q8H SCH    mirtazapine  45 mg Oral QHS    naltrexone  50 mg Oral Daily    pantoprazole  40 mg Oral QAM AC    polyethylene glycol  17 g Oral Daily    thiamine  100 mg Oral Daily    vitamins/minerals  1 tablet Oral Daily     Continuous Infusions:  PRN Meds:.acetaminophen, alum & mag hydroxide-simethicone, diphenhydrAMINE **OR** diphenhydrAMINE, hydrOXYzine, ibuprofen, QUEtiapine    Medical Work-up:  CMP tomorrow, per internal medicine team   Consults: IM and Urology teams consulted and appreciated    Psychosocial Plan:  Individual therapy: Psycho-education and psychotherapy of the following modalities will continue to be provided during daily visits:Supportive, Cognitive Behavioral Therapy, and Insight Oriented Psychotherapy  Group and milieu therapies: daily per unit's schedule.  Continue with Social Work intervention provided for discharge planning including assistance with placement and follow-up psychiatric care.    Plan for Family Involvement: None Available  Other Providers Contact Information and Dates Contacted: No Updates    I personally saw and examined the patient and spent 24 minutes on this visit reviewing previous notes, counseling the patient and/or family members regarding the patient's condition(s), ordering of tests, managing medications, and documenting the findings in the note.     Signed by: Cecille Rubin, NP  11/14/2022  12:00 PM

## 2022-11-14 NOTE — Group Note (Incomplete)
Group: BH - Creative  Group Topics: ***  Group Date: 11/14/2022  Start Time: 1600  End Time: 1700  Number of Participants: 8  Facilitators: Estelle Grumbles   Department: [facility] [employer]    Discipline Led: BHT  Number of Patient Participants: 8      Name: Gary [name]. Date of Birth: 1980-10-21   MR: [id number]      Level of Participation: {THERAPIES; PSYCH GROUP PARTICIPATION QQVZD:63875}  Quality of Participation: {THERAPIES; PSYCH QUALITY OF PARTICIPATION:64221}  Interactions with Others: {THERAPIES; PSYCH INTERACTIONS:64222}  Mood/Affect: {THERAPIES; PSYCH MOOD/AFFECT:64223}  Triggers (if applicable): ***  Cognition: {THERAPIES; PSYCH COGNITION:64224}  Organization: {THERAPIES; PSYCH GROUP PARTICIPATION IEPPI:95188}  Progress: {THERAPIES; PSYCH PROGRESS:64226}  Response: ***  Plan: {THERAPIES; PSYCH CZYS:06301}    Patients Problems:   Patient Active Problem List   Diagnosis   . Suicidal ideation

## 2022-11-15 LAB — COMPREHENSIVE METABOLIC PANEL
ALT: 115 U/L — ABNORMAL HIGH (ref 0–55)
AST (SGOT): 43 U/L — ABNORMAL HIGH (ref 5–41)
Albumin/Globulin Ratio: 1.3 (ref 0.9–2.2)
Albumin: 3.6 g/dL (ref 3.5–5.0)
Alkaline Phosphatase: 76 U/L (ref 37–117)
Anion Gap: 12 (ref 5.0–15.0)
BUN: 18 mg/dL (ref 9–28)
Bilirubin, Total: 0.2 mg/dL (ref 0.2–1.2)
CO2: 25 mEq/L (ref 17–29)
Calcium: 9 mg/dL (ref 8.5–10.5)
Chloride: 105 mEq/L (ref 99–111)
Creatinine: 0.9 mg/dL (ref 0.5–1.5)
GFR: >60 mL/min/{1.73_m2} (ref >=60.0)
Globulin: 2.7 g/dL (ref 2.0–3.6)
Glucose: 90 mg/dL (ref 70–100)
Hemolysis Index: 16 Index
Potassium: 4.6 mEq/L (ref 3.5–5.3)
Protein, Total: 6.3 g/dL (ref 6.0–8.3)
Sodium: 142 mEq/L (ref 135–145)

## 2022-11-15 MED ORDER — GABAPENTIN 300 MG PO CAPS
600.0000 mg | ORAL_CAPSULE | Freq: Three times a day (TID) | ORAL | Status: DC
Start: 2022-11-15 — End: 2022-11-17
  Administered 2022-11-15 – 2022-11-17 (×6): 600 mg via ORAL
  Filled 2022-11-15 (×6): qty 2

## 2022-11-15 NOTE — Care and Service Plan (Signed)
Interdisciplinary Treatment Plan Update Meeting    11/15/2022  [name]    Participants:  Patient:  Gary [name].  Attending Physician:  Farris Has, MD  RN: Margie Ege  Social Worker: Forestine Na  Other: Fabio Bering NP    Short Term Goal:  "Continue to come out of my room to walk, go to at least one group, and find ways to work on my sobriety."  Long Term Goal:  "Re-establish relationships with my family and maintain sobriety.    Objective:  Review response to treatment, reassess needs/goals, update plan as indicated incorporating patient's strengths and stated needs, goals, and preferences.      1. Summary of Patient Progress on Treatment Plan Goals:      2. Level of Patient Involvement:  Actively engaged/contributing    3. Patient Understanding of Plan of Care:  Able to verbalize goals and interventions    4. Level of Agreement/Commitment to Plan of Care:  Agrees with and is committed to plan of care

## 2022-11-15 NOTE — UM Notes (Signed)
[facility]  [address]  [place], [place] [address]  NPI: [id number]  Tax ID: [id number]        Gary [name].   DOB: 27-Aug-1980  [place] [employer]  ID: [id number]         Attending:  Dr. Graceann Congress, MD / NPI: [id number]     Inpatient CSR for 10/28/2022     Legal Status: Voluntary     7/28 Per chart note of NP Cecille Rubin:  Slept at night  Mood: still anxious  Depressive thoughts: hopelessness; talked about his natural tendencies to not doing well with change     Anxiety: reported having a moment of shakiness yesterday, but not today  SI: "often passive thoughts". Denied intent or plan to harm self   Craving alcohol: talking about it with unit therapist brings up emotions, and that increases craving and desire to drink.      showers daily  PRN vistaril  Appreciates wearing clothing donated from the unit, and how that improves his self-esteem     Goal for today:  - attend art-therapy groups  - hydrate,   -interact with others on the unit     7/28 Per Nursing note:  Gary [name] rated  his depression as 5/10 and anxiety as 4/10 (with 10 being the highest possible value).     7/28 VS:  T 97.9, HR 73, RR 16, bp 104/70, Pox 97%    7/28 Psychiatric Diagnoses per NP Cecille Rubin:  Depression, unspecified  Rule out MDD vs substance-induced mood disorder  F40.10 Social Anxiety Disorder  F10.10 Alcohol Use Disorder  Rule out PTSD     7/28 Mental Status Exam per NP Cecille Rubin:  General appearance: Appears chronological age, Good hygiene, and Good grooming  Attitude/Behavior: Cooperative, Eye Contact is  Good, and calm  Motor: No abnormalities noted  Gait: No obvious abnormalities  Muscle strength and tone: grossly intact  Speech:  Spontaneous: Yes  Rate and Rhythm: Normal  Volume: Normal  Tone: Normal  Clarity: Yes  Mood: "anxious"  Affect:  Dysthymic   Range: Constricted  Stability: Stable  Appropriateness to thought content: Yes  Intensity:  Normal  Thought Process:  Coherent: Yes  Logical:  Yes  Associations: Circumstantial  Thought Content:  Delusions:  Not elicited  Depressive Cognitions:  reported ongoing struggle with hopelessness  Suicidal:  passive SI  Homicidal:  No homicidal thoughts  Violent Thoughts:  No  Perceptions:  Hallucinations: No  Insight: Fair  Judgment: Fair  Cognition:  Level of Consciousness: Intact  Orientation: Intact to self, place and time    7/28 Assessment / Plan per NP Cecille Rubin:  patient agreed to take his naltrexone dose from this AM. The shakiness he felt yesterday most likey due to anxiety. No nausea (usually the effect of naltrexone). Lower appetite may not be exclusively due to naltrexone.   - pt educated to continually be reminded that he can't take opioids in the future while on naltrexone,   - CMP tomorrow, per Internal Medicine team.    - appreciate internal medicine follow ups. Pt appreciative that RUQ Ultra sound was negative  At this time, the patient would continue to benefit from inpatient psychiatric hospitalization for safety, stabilization, diagnostic clarification, medication management, and discharge planning. SW assisting with placement.       7/29 Per Nursing note:  Gary [name] was alert and oriented times four and presented with appropriate speech; an anxious mood; mood-congruent affect;  and a cooperative attitude during interactions with this Clinical research associate.     Gary [name] continues to endorse severe depression and anxiety, passive suicidal ideations and abdominal pain.       Patient also received PRN Vistaril 100 mg for increased anxiety and sleep assistance; medication had adequate effect     7/29 Per Nursing note:   He admitted to having cravings for alcohol stating, "If I could sneak liquor into the hospital, I would."  He rated his intensity level a 10/10 using a scale between 1 and 10 with 10 being the highest.  He has been visible in the Milieu either attending group, lingering in the [employer], or walking on Unit Hallway where he was observed conversing with a male peer.  He made his needs known including his request for PRN Vistaril 100 MG PO for anxiety which he rated 8/10.  When asked what is attributing to his anxiety, he stated, "I have a lot going on right now."  Upon reassessment, he rated it 5/10.     7/28-7/29 Scheduled and PRN Meds:  Wellbutrin XL 150mg  po daily  Neurontin 400mg  po q8hr   Remeron 45mg  po qHS   Revia 50mg  po daily (refused 7/29 dose)  Vistaril 75mg  po q4hr prn-->100mg  po q6hr prn (7/25) (rec'd 2 doses on 7/27, 1 dose on 7/28 & 1 dose on 7/29)     7/28-7/29 Treatment plan:  Activity as tolerated  Close monitoring q checks        VS bid   Ensure Plus High Protein supplement bid w/breakfast & dinner  Bladder scan / PVR  Medication management and stabilization  Group/individual therapy  Therapeutic Recreation eval and treat  D/c planning                        Rowan Blase, BSN, RN, [employer], ACM-RN          Utilization Review   [facility]  [address]  [address]  [place], [place] [address]  Phone: [phone] (VM)  [place]: [phone]  Fax: [phone]

## 2022-11-15 NOTE — Group Note (Signed)
BH - Art Therapy and Music Therapy Collaboration Group  Inpatient Behavioral Health Group Note    Patient Name:  Gary [name].       Medical Record Number: [id number]   Date of Birth: 04-16-1981  Sex: Male          Rehab Diagnosis: Suicidal ideation [R45.851];Alcohol dependence with uncomplicated intoxication [F10.220]     Today's Date: 11/15/2022   Group Start Time: 1300   Group End Time: 1400   Group Facilitators: Yehuda Savannah; Jaynie Collins, Morgann Woodburn Hui; Ileene Musa  Group Topic: BH - Art Therapy  Group members were engaged with music and art therapy activity "making a postcard for yourself"  which aim to promote relaxation, self-awareness, creative exploration, and self-expression.     Patient's response to treatment:    Participation : Partial  Appearance: Neat  Mood: Euthymic   Affect: Full  Behavior: Cooperative, Attentive, and Sociable  Thought Content: Appropriate to the session  Thought Process: Linear/ Logical  Speech: Normal    Mr. Blye took initiative to join the group 15 minutes before the group ends. He requested a song and was noticed to enjoy listening to it by singing along. He also interacted with peers at times. In comparison to his art and music therapy group participation, he was significantly much more engaged with more initiative.  Overall, he was calm and cooperative.      Recommendation:  To continue art therapy engagement in order to promote  self- expression, stress relief, mindfulness, social skills, and establish positive coping skills for symptom management.      Signed by:   Vincenza Hews, Adonijah Baena Kerrie [place] , [place]  [facility] [employer] [facility]

## 2022-11-15 NOTE — Progress Notes (Signed)
Psychiatry Progress Note  (Level 1 = Problem Focused, Level 2 = Expanded Problem Focused, Level 3 = Detailed)    Patient Name: Gary [name].            Current Date/Time:  7/29/20244:56 PM  MRN:  [id number]                            Attending Physician: Farris Has, MD  DOB: [birth year]                                Gender: male                              I. History   Informants: The patient, Treatment team notes, Chart reviews, and staff of the unit.  A. Chief Complaint or Reason for Admission   " Admitted for worsening Depression and Suicidal Ideations "    B.History of Present Illness:  Patient is a 42 y.o. divorced, un-domiciled male with history of depression, anxiety, and alcohol use disorder admitted for worsening depression and suicidal ideation with a plan to jump off a bridge. Patient was evaluated by the [facility] and is currently admitted voluntarily.     Interval History : 11/15/2022  Patient is seen and evaluated today by this provider, the case discussed with the attending psychiatrist, interim charting ,nursing and therapy notes reviewed.   Patient is visible in the milieu walking around the unit and socializing with peers.  Upon approach, patient is alert oriented, calm and cooperative with no acute distress.  He states that his anxiety and depression level has decreased a little bit and his mood is better than before.  Patient continues to endorse passive suicidal thoughts such as " I wish I go to sleep and not wake up.  I wish I were dead because there is no reason or purpose for me to live ".  Patient states that these passive suicidal thoughts are almost constant and they do not go away but he denies any specific plan or intent to die at this time.  He reports being hopeless and helpless because of his unstable living situation, not having any job and ongoing craving for alcohol.    Patient states that he was living in a recovery program facility where he relapsed to drinking alcohol  and was kicked out from that facility.  Per patient, he lost his job in March 2024 and he has been struggling with depression and anxiety since then.  Patient told this provider that naltrexone is not helping for his alcohol craving and he does not want to take it anymore.  Naltrexone discontinued per patient's request.  Patient states that Vistaril helps his any likely briefly but he is not sure about other medication helping his anxiety or depression.  Patient states that he was totally isolated in the room before but now due to treatment team encouragement, he has started coming out of the room and takes few lapses of the unit.  Patient states that he is not interested in going to all the groups but he wants to go and creative therapy.  Patient reports fair appetite and fair sleep.  Per nursing/therapy notes, patient is med compliant except naltrexone and reports no side effects with the medications.  He is cooperative with nursing care and assessment.  He has no safety or behavioral issues and follows the directions.  Patient is visible in the milieu and attends minimum number of groups as well.  Will continue monitoring the patient for safety and mood stabilization.    C.Medical History  Review of Systems  (Level 1 is none, Level 2 is 1, and Level 3 is 2+)  A complete 14 point ROS was done and was negative except for  Psychiatric: Mania and Psychosis  Constitutional: No complaints  Eyes: No changes  Ears/Nose/Mouth/Throat: No changes  Cardiovascular: No complaints  Respiratory: No complaints  Gastrointestinal: No complaints  Genitourinary: No complaints  Muscular: No complaints  Integumentary: No complaints  Neurological: Seizure  Endocrine: No complaints  Allergy/Immumologic: No complaints  Hematologic/Lymphatic: No complaints    D.Additional Past Psychiatric, Substance Use, Medical, Family and Social History  Psychiatric: No new information available as compared to previous encounter(s)  Substance Use: No new  information available as compared to previous encounter(s)  Medical: No new information available as compared to previous encounter(s)  Family: No new information available as compared to previous encounter(s)  Social: No new information available as compared to previous encounter(s)    Medications:   Current Medications       Scheduled       Medication Ordered Dose/Rate, Route, Frequency Last Action    buPROPion XL (WELLBUTRIN XL) 24 hr tablet 150 mg    150 mg, PO, Daily Given, 150 mg at 07/29 0845    folic acid (FOLVITE) tablet 1 mg    1 mg, PO, Daily Given, 1 mg at 07/29 0845    gabapentin (NEURONTIN) capsule 600 mg    600 mg, PO, Q8H SCH Ordered    mirtazapine (REMERON) tablet 45 mg    45 mg, PO, QHS Given, 45 mg at 07/28 2316    pantoprazole (PROTONIX) EC tablet 40 mg    40 mg, PO, QAM AC Given, 40 mg at 07/29 0845    thiamine (VITAMIN B1) tablet 100 mg    100 mg, PO, Daily Given, 100 mg at 07/29 0845    vitamins/minerals tablet 1 tablet    1 tablet, PO, Daily Given, 1 tablet at 07/29 0845              PRN       Medication Ordered Dose/Rate, Route, Frequency Last Action    acetaminophen (TYLENOL) tablet 650 mg    650 mg, PO, Q6H PRN Given, 650 mg at 07/28 2316    alum & mag hydroxide-simethicone (MAALOX PLUS) 200-200-20 mg/5 mL suspension 30 mL    30 mL, PO, Q6H PRN Given, 30 mL at 07/28 1404    diphenhydrAMINE (BENADRYL) capsule 50 mg (Or Linked Group #1)    50 mg, PO, Once PRN Ordered    diphenhydrAMINE (BENADRYL) injection 50 mg (Or Linked Group #1)    50 mg, IM, Once PRN Ordered    hydrOXYzine (VISTARIL) capsule 100 mg    100 mg, PO, Q6H PRN Given, 100 mg at 07/29 1455    ibuprofen (ADVIL) tablet 400 mg    400 mg, PO, Q6H PRN Ordered    ondansetron (ZOFRAN-ODT) disintegrating tablet 4 mg    4 mg, PO, Q8H PRN Ordered    polyethylene glycol (MIRALAX) packet 17 g    17 g, PO, Daily PRN Ordered    QUEtiapine (SEROquel) tablet 25 mg    25 mg, PO, BID PRN Ordered  II. Examination   Vital signs  reviewed:   Blood pressure (!) 129/91, pulse 95, temperature 99.3 F (37.4 C), temperature source Oral, resp. rate 16, height 1.702 m (5\' 7" ), weight 68 kg (150 lb), SpO2 95%.     Mental Status Exam  (Level 1 is 1-5, Level 2 is 6-8, Level 3 is 9+)  General appearance: Appears chronological age, Good hygiene, and Good grooming  Attitude/Behavior: Calm, Cooperative, and Guarded  Motor: No abnormalities noted  Gait: No obvious abnormalities  Muscle strength and tone: Not tested  Speech:   Spontaneous: Yes  Rate and Rhythm: Normal  Volume: Normal  Tone: Normal  Clarity: Yes  Mood: Anxious and Depressed.  Affect:   Range: Full  Stability: Stable  Appropriateness to thought content: Yes  Intensity: Normal  Thought Process:   Coherent: Yes  Logical:  Yes  Associations: Goal-directed  Thought Content:   Delusions:  Not elicited  Depressive Cognitions:  Hopeless and Helpless  Suicidal:  Suicidal thoughts  Homicidal:  No homicidal thoughts  Violent Thoughts:  No  Perceptions:   Dissociative Phenomena:  No  Hallucinations: No  Illusions:  No  Insight: Fair  Judgment: Fair  Cognition:   Level of Consciousness: Intact  Orientation: Intact to self, place and time    Psychiatric / Cognitive Instruments: None    Pertinent Physical Exam: Not relevant to current chief complaint/reason for admission    Imaging / EKG / Labs: None    III. Assessment and Plan (Medical Decision Making)     1. I certify that this patient continues to require inpatient hospitalization due to acute risk to self and unable to care for self in the community with insufficient support available    2. Psychiatric Diagnoses    Depression, unspecified  Rule out MDD vs substance-induced mood disorder  Social Anxiety Disorder  Alcohol Use Disorder  Rule out PTSD    3. Additional Diagnoses  -None     4.All diagnostic procedures completed since admission were reviewed. Past medical records reviewed. Coordination of care was discussed with inpatient team and as  available with the outpatient team.    5. On this admission patient educated about and provided input into their treatment plan.  Patient understands potential risks and benefits of proposed treatment plan.     6.  Assessment / Impression  Patient is a 42 y.o. divorced, un-domiciled male with history of depression, anxiety, and alcohol use disorder admitted for worsening depression and suicidal ideation with a plan to jump off a bridge. Patient was evaluated by the [facility] and is currently admitted voluntarily.     Patient has shown a gradual improvement in his psych symptoms.  His level of anxiety and depression seems to be a little better than before although he endorses high level of anxiety and depression on a scale 0-10.  He endorses passive suicidal thoughts but denies any active suicidal thoughts, intent or plan to hurt himself.  Patient is med compliant and is more visible in the milieu.  He is able to perform his activities of daily living.  Social worker is working on patient's placement in a alcohol rehab program if there is an opening and if he is excepted there.  Per patient's recent mood and behavior and improvement of symptoms, patient will be able to be discharged in the middle of this week.  Will continue monitoring the patient for safety and mood stabilization.  No new complaints voiced.    Suicide Risk Assessment  Suicide Risk Assessment performed? Yes: Suicide Thoughts / Behaviors: Yes  Current Plan: None    Plan / Recommendations:    Biological Plan:  Medications:   Discontinue Naltrexone 50 mg po daily.  Increase Gabapentin 600 mg po every 8 hours.  Continue following medications ;  Wellbutrin XL 150 mg po daily.  Remeron 45 mg po hs.  Protonix 40 mg po AC.  Thiamine 100 mg po daily.  Folic Acid 1 mg po daily.  Multivitamin 1 tab. Po daily.    Medical Work-up: None required at this time  Consults:  Internal Medicine consulted.    Psychosocial Plan:  Individual therapy: Psycho-education and  psychotherapy of the following modalities will continue to be provided during daily visits:Supportive and Insight Oriented Psychotherapy  Group and milieu therapies: daily per unit's schedule.  Continue with Social Work intervention provided for discharge planning including assistance with placement and follow-up psychiatric care.    Plan for Family Involvement:  No updates.  Other Providers Contact Information and Dates Contacted: No Updates    I personally saw and examined the patient on 11/15/2022 and spent 35 minutes on this visit reviewing previous notes, counseling the patient and/or family members regarding the patient's condition(s), ordering of tests, managing medications, and documenting the findings in the note. The patient was also seen by the attending psychiatrist Dr.Michael Merlinda Frederick, MD.    Signed by: Fabio Bering, NP  11/15/2022  4:56 PM

## 2022-11-15 NOTE — Plan of Care (Addendum)
Problem: IP Suicidal Ideation  Description: Gary [name] endorses suicidal ideation with plan to jump off bridge  Goal: LTG: Gary [name] will exhibit compliance with therapy.  Outcome: Progressing  Patient remains nonadherent to group participation, however patient is complaint with medication regimen.     Problem: IP Suicidal Ideation  Description: Gary [name] endorses suicidal ideation with plan to jump off bridge.  Goal: STG: Gary [name] will not engage in self-injurious activities.  Outcome: Progressing  Patient remained safe and free of self-injurious behavior throughout the shift.     Gary [name] was alert and oriented times four and presented with appropriate speech; an anxious mood; mood-congruent affect; and a cooperative attitude during interactions with this Clinical research associate. On approach, Gary [name] could be witnessed sitting in the milieu, watching television and socializing with peers. Patient had an active presence on unit throughout the shift and was encouraged to continue socializing to prevent isolative behavior. During assessment, patient described his mood as "okay," continuing to report feeling the "same." Gary [name] continues to endorse severe depression and anxiety, passive suicidal ideations and abdominal pain. Patient was encouraged to report any worsening feelings of depression/anxiety, persistent thoughts of self harm/harm to others, and overall concerns to this Clinical research associate and staff; patient verbalized understanding. Gary [name] did deny feelings/thoughts of homicidal ideations and audiovisual hallucinations. Patient received PRN Tylenol 650 mg for flank and abdominal pain, which he rated a 9/10. Patient also received PRN Vistaril 100 mg for increased anxiety and sleep assistance; medication had adequate effect. Gary [name] was medication compliant and mouth check was conducted after administration. Patient reported no issues with appetite, sleep and toileting habits. Overall, no major behavioral issues to report.      Q15 minute checks and hourly RN rounding  for patient safety maintained, and plan of care continues.      Patient was witnessed resting for approximately 6 hours as of 0600.

## 2022-11-15 NOTE — Group Note (Signed)
Group: BH - Creative  Group Topics: Creative  Group Date: 11/15/2022  Start Time: 1530  End Time: 1630  Number of Participants: 9  Facilitators: Colon Branch   Department: [facility] [address]    Discipline Led: BHT  Number of Patient Participants: 9      Name: Gary [name]. Date of Birth: 1980-06-19   MR: [id number]      Level of Participation: moderate  Quality of Participation: cooperative  Interactions with Others:  minimal interaction with peers  Mood/Affect: "still depressed"  Triggers (if applicable): a song that reminded him of his ex-wife  Cognition: coherent/clear  Organization: completed task  Progress: Moderate  Response: Pt sat to the side of the room. He spoke to TW though  engaged minimally with peers. Pt requested a song and then became sad as it played, stating that the song always had that affect on him. He left group soon after. Writer checked in with pt after group. He shared how part of the song reflected feelings he had towards his ex-wife. Discussed allowing himself to process his feelings rather than bury them.  Pt shared that he puts on a bright face for staff but is still depressed. He said he does not like to show his feelings on the unit for fear of being judged. Reassured pt that this was a safe environment for this.  Plan: patient will be encouraged to continue attending groups.    Patients Problems:   Patient Active Problem List   Diagnosis    Suicidal ideation

## 2022-11-15 NOTE — Progress Notes (Signed)
The SW met with the Pt at bedside. The Pt stated he called [facility] [address], and they currently have an open bed; He also contacted [facility] and is waiting for a callback. He expressed frustration with the [facility] process and is now interested in a referral to [facility].     The Pt then asked for help paying for a bus ticket in case these options don't work out, as he could move in with his brother. The SW inquired about his brother's stability for this arrangement. The Pt stated his brother is currently unemployed due to felony charges but works under the table. He mentioned that his brother occasionally drinks but uses crack or cocaine which he doesn't care for those drugs. However, he is not ready to stop drinking. The SW validated Pt feelings and encouraged him to continue trying, emphasizing that his progress depends on his efforts. The SW suggested reconsidering rehab, and the Pt agreed to think about it. He expressed interest in finding a therapist, which the SW will assist with.    The SW will complete referrals for [facility] and [facility].    Tyna Jaksch, MSW

## 2022-11-15 NOTE — Plan of Care (Addendum)
Problem: Pain interferes with ability to perform ADL  Goal: Pain at adequate level as identified by patient  Description: Interventions:  1. Identify patient comfort function goal  2. Evaluate if patient comfort function goal is met  3. Assess pain on admission, during daily assessment and/or before any "as needed" intervention(s)  4. Reassess pain within 30-60 minutes of any procedure/intervention, per Pain Assessment, Intervention, Reassessment (AIR) Cycle  5. Evaluate patient's satisfaction with pain management progress  6. Offer non-pharmacological pain management interventions  7. Consult/collaborate with Pain Service  8. Consult/collaborate with Physical Therapy, Occupational Therapy, and/or Speech Therapy  9. Assess for risk of opioid induced respiratory depression and side effects, including snoring/sleep apnea. Alert healthcare team of risk factors identified.  10. Include patient/patient care companion in decisions related to pain management as needed  Outcome: Progressing:  Gary [name] identified pain at adequate level during this Day Shift.     Problem: IP Suicidal Ideation  Description: Gary [name] endorses suicidal ideation with plan to jump off bridge  Goal: LTG: Kule will exhibit compliance with therapy  Outcome: Progressing:  Gary [name] exhibited compliance with therapy during this Day Shift.  Goal: STG: Gary [name] will not engage in self-injurious activities  Outcome: Progressing:  Gary [name] did not engage in self-injurious activities or attempted to do so during this Day Shift.     Upon approaching Gary [name] this morning, he was walking on Unit Hallway.  He was receptive to greeting from Gary [name] fundraiser.  He presented with a constricted affect.  He maintained good eye contact during Daily Shift Assessment.  He is A+O x 4.  He stated his name and DOB prior to receiving his scheduled AM medications which also included Wellbutrin XL 150 MG PO.  He declined Revia 50 MG PO "because it doesn't help."  He denied HI/AVH or thoughts of self-harm, but,  did report having passive thoughts of SI.  When asked if he has a plan, he shook his head no.  He admitted to having cravings for alcohol stating, "If I could sneak liquor into the hospital, I would."  He rated his intensity level a 10/10 using a scale between 1 and 10 with 10 being the highest.  He has been visible in the Milieu either attending group, lingering in the [employer], or walking on Unit Hallway where he was observed conversing with a male peer.  He made his needs known including his request for PRN Vistaril 100 MG PO for anxiety which he rated 8/10.  When asked what is attributing to his anxiety, he stated, "I have a lot going on right now."  Upon reassessment, he rated it 5/10.  He has been pleasant when approached.  He attended to his ADL's.  Gary [name] has maintained safety.  RN will monitor for continued safe behavior.    "I, Gary [name], hereby attest that I have been made aware of and reviewed the following for this patient:    - Biopsychosocial assessment;  - Diagnoses;  - Nursing needs and medical support, if applicable; and  - Treatment Plan Goals, Objectives and Strategies/Interventions.    I am involved in the care of this patient within my scope of practice and assigned tasks. I approve the treatment plan and this attestation is in lieu of a signature on the document."

## 2022-11-15 NOTE — Progress Notes (Signed)
MEDICINE CONSULT FOLLOW UP    Date Time: 11/15/22 11:24 AM  Patient Name: Gary [name],[name]  Requesting Physician: Farris Has, MD  Consulting Physician: Orlie Pollen, MD    Primary Care Physician: Marisa Sprinkles, MD    Reason for Consultation: Abdominal pain      Assessment:     Patient Active Problem List    Diagnosis Date Noted    Suicidal ideation 10/28/2022     42 y/o man with history of nephrolithiasis, alcohol use disorder, low back pain, and depression, admitted for management of depression and suicidal ideation, now with worsening abdominal pain and dysuria, most concerning for persistent or recurrent nephrolithiasis without signs of sepsis. Currently afebrile and hemodynamically stable.    7/27: Abdominal Pain. Etiology not clear.  LFTs elevated so could be alcoholic liver disease. US Abdomen -ve.  PRN Ibuprofen(dose reduced today).  Will f/u LFTs on Monday.  7/28: No new complaints. F/u LFTs in AM  7/29: No new complaints. LFTs stable.  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 Needs f/u as outpatient and if it does not improve GI follow up as outpatient.  Also needs Urology f/u for Kidney Stones.  Will sign off for now. Please consult [place] again if any concerns.    Recommendations:   #Abdominal pain  With history of recent 1.6 cm stone, concern for persistent nephrolithiasis. Patient is still urinating well. Patient has dysuria but otherwise no indication of infection/sepsis (afebrile, no subjective chills, nausea). Low suspicion for acute surgical pathology I.e. testicular torsion, appendicitis, based on exam and history. May also be a component of constipation and gas pain contributing to his abdominal discomfort, given his distention and tympany. Still passing flatus, so low suspicion for complete obstruction  - CBC with diff, CMP, UA ordered  - Follow up CT ab/p  - If <10 mm stone present without significant obstruction, would proceed with pain management, hydration +/- alpha blocker  - If  obstruction, significant AKI, or intractable pain, may require urologic intervention  - Encourage PO fluid consumption, goal UOP>2500 mL/day  - Pain management: continue acetaminophen 650 mg PRN, ibuprofen 800 mg q6h PRN   - Added oxycodone 5 mg q6h, one-time dilaudid 1 mg for breakthrough  - Can defer antibiotics pending UA  - Bowel regimen: Miralax once daily, can increase to BID, recommend adding Pericolace BID PRN  7/24: Appreciate Urology input.  I think the abdominal pain which is mostly in the RUQ and Epigastric region with tenderness as well is probably due to alcoholic liver disease.  I've advised the patient to quit drinking which should help him.  Hep Panel is negative.  F/u Lipase but doubt pancreatitis coz the CT did not show signs of it.  LFTs are elevated, but not the typical Alcohol pattern. Will follow LFTs in 1-2 days.  May get US Liver if pain does not improve or if LFTs trending up.  7/25: Still c/o 9-10/10 Abdominal pain. Pain is out of proportion to signs and symptoms.  Will get a RUQ [place]. F/u LFTs in AM.  I told him to limit the amout of ibuprofen he is using.  7/26: [place] -ve, LFTs still elevated.  Bladder Scan -ve.    #GERD  - Continue PPI    #Alcohol use disorder  - Continue acamprosate, folate, thiamine    #Low back pain  - Continue gabapentin    #Depression  - Per psychiatry team    DVT Ppx: not indicated, patient ambulating  Diet: Regular  Telemetry: Not indicated  Full code    Farris Has, MD, thank you for this consultation.  We will follow the patient with you during this hospitalization. Please contact us with any questions or issues.                History of Presenting Illness:   Gary Knoell. is a 42 y.o. male with history of nephrolithiasis, low back pain, alcohol use disorder, and depression, admitted to inpatient psychiatry for management of depression with suicidal ideation. Medicine was consulted for concern of abdominal pain and nephrolithiasis.    Of note, he  states that he was seen in a [facility] in June for severe abdominal pain that had him "doubled over." He was also having "fire-colored" urine. He underwent a CT ab/p 6/26, which revealed a 1.6 cm right nephrolithiasis with moderate dilation of mid and lower pole calyces. He states that he had a discussion with a surgeon who counseled him on the risk of infection and kidney problems with a stone that size, but he could not undergo surgery at that time as he was living on the [address]. He states that he was eventually planning for stone removal in August.    Today, Gary [name] reports that over the past 2 days he has had progressively worsening right-sided abdominal pain that radiates to the middle of his abdomen and to his back. He states that since his hospitalization in June he has had constant pain that has been relatively tolerable but it was always contained to the right side. The pain worsens with stretching and urinating. It is currently an 8/10, but improves to a 6/10 with Motrin. He was able to sleep through the night. He also states that his abdomen appears larger today than usual, and it feels full. He reports 1 week of dysuria; he is still producing his usual amount of urine and it is "crystal clear." States that he still has an appetite and has good PO intake. Denies testicular/scrotal pain. He also notes that he has been somewhat constipated. He typically has 1 soft bowel movement per day but for the past several days has had 2-3 hard, pellet-like bowel movements per day. States that Miralax is not helping. Denies fevers/chills, nausea, vomiting, diarrhea.    He denies history of other medical problems. No history of abdominal surgery. No family history of nephrolithiasis. He does not currently have housing or outpatient medical care.      Past Medical History:     Past Medical History:   Diagnosis Date    Anxiety     Depression        Available old records reviewed, including:  collateral from  [facility], Care everywhere including CT ab/p 6/26    Past Surgical History:   History reviewed. No pertinent surgical history.    Family History:   History reviewed. No pertinent family history.    Social History:   Tobacco Use History[1]  Social History     Substance and Sexual Activity   Alcohol Use Yes    Comment: daily- "until I pass out" " Yesterday I had 3 pints of vodka" 10/28/2022     Social History     Substance and Sexual Activity   Drug Use Never       Allergies:   Allergies[2]    Medications:     Home Medications       Med List Status: Complete Set By: Katrinka Blazing, RN at 10/29/2022 12:49 AM  ibuprofen (ADVIL) 800 MG tablet     Take 1 tablet (800 mg) by mouth 3 (three) times daily as needed     omeprazole (PriLOSEC) 40 MG capsule     Take 1 capsule (40 mg) by mouth daily            Current Facility-Administered Medications   Medication Dose Route Frequency    buPROPion XL  150 mg Oral Daily    folic acid  1 mg Oral Daily    gabapentin  400 mg Oral Q8H SCH    mirtazapine  45 mg Oral QHS    naltrexone  50 mg Oral Daily    pantoprazole  40 mg Oral QAM AC    thiamine  100 mg Oral Daily    vitamins/minerals  1 tablet Oral Daily            Review of Systems:   All other systems were reviewed and are negative unless noted in HPI.    Physical Exam:   Patient Vitals for the past 24 hrs:   BP Temp Temp src Pulse Resp SpO2   11/15/22 0743 109/71 97.7 F (36.5 C) Oral 74 -- 96 %   11/14/22 1941 122/85 97.9 F (36.6 C) Oral 94 16 96 %   11/14/22 1501 123/79 99.1 F (37.3 C) Oral 97 17 94 %     Body mass index is 23.49 kg/m.  No intake or output data in the 24 hours ending 11/15/22 1124    General: awake, alert, oriented x 3; walking around room in no distress, well-appearing  Cardiovascular: normal rate and regular rhythm, no murmurs appreciated  Lungs: normal effort on room air, good bibasilar air entry, no wheezing, rhonchi, or rales  Abdomen: Normal bowel sounds. RUQ tenderness, no masses or hernias,  no rebound or guarding.  Extremities: no edema  Neuro: no lateralizing deficits  Skin: no rashes or lesions noted    Labs:     No results for input(s): "WBC", "HGB", "HCT", "PLT" in the last 72 hours.      Recent Labs     11/15/22  0448   Sodium 142   Potassium 4.6   Chloride 105   CO2 25   BUN 18   Creatinine 0.9   Glucose 90   Calcium 9.0         Recent Labs     11/15/22  0448   AST (SGOT) 43*   ALT 115*   Alkaline Phosphatase 76   Protein, Total 6.3   Albumin 3.6       No results for input(s): "PTT", "PT", "INR" in the last 72 hours.      Imaging personally reviewed, including:   US Abdomen Limited Ruq    Result Date: 11/12/2022   No acute abnormality. Nonacute findings detailed above. Johnsie Kindred, MD 11/12/2022 6:40 AM    CT Abdomen Pelvis WO IV/ WO PO Cont    Result Date: 11/09/2022  1. Nonobstructing right nephrolithiasis. 2. Bilateral renal cysts. Georgiana Spinner, MD 11/09/2022 12:48 PM         Orlie Pollen, MD  11/15/2022  11:24 AM      cc: Farris Has, MD  Pcp, None, MD         [1]   Social History  Tobacco Use   Smoking Status Never   Smokeless Tobacco Never   [2]   Allergies  Allergen Reactions    Abilify [Aripiprazole]     Effexor [Venlafaxine]  Haldol [Haloperidol]     Lithium     Paxil [Paroxetine]     Prozac [Fluoxetine]     Vivitrol [Naltrexone]

## 2022-11-15 NOTE — Progress Notes (Signed)
Attendance Note:  Pt attended 2 of 5 groups today.  Please refer to group notes for detail.

## 2022-11-16 NOTE — Group Note (Signed)
Group: BH - Creative  Group Topics: Expressive   Group Date: 11/16/2022  Start Time: 1500  End Time: 1615  Number of Participants: 5  Facilitators: Tonette Lederer T   Department: [facility] [address]    Discipline Led: BHT  Number of Patient Participants: 5      Name: Gary [name]. Date of Birth: February 14, 1981   MR: [id number]      Level of Participation: arrived late  Quality of Participation: cooperative  Interactions with Others: supportive  Mood/Affect: brightens with interaction  Triggers (if applicable): N/A  Cognition: logical  Organization: completed task  Progress: Significant  Response: Pt observed quietly during group. Pt was able to interact with peer to draw on dry erase board.   Plan: patient will be encouraged to attends and participates in all groups.     Patients Problems:   Patient Active Problem List   Diagnosis    Suicidal ideation

## 2022-11-16 NOTE — Group Note (Signed)
Group: BH - Group Therapy   Group Topics: Patients were asked to come up with a topic that they would like to process with the group.   Group Date: 11/16/2022  Start Time: 1330  End Time: 1415  Number of Participants: 8  Facilitators: Estelle Grumbles   Department: [facility] [address] [place]    Discipline Led: BHT  Number of Patient Participants: 8      Name: Gary [name]. Date of Birth: [birth year]   MR: [id number]      Level of Participation: arrived late  Quality of Participation: attentive, cooperative, and quiet  Interactions with Others:  quiet  Mood/Affect: anxious  Triggers (if applicable): Patient is triggered by groups/gets highly anxious   Cognition: UTA  Organization:  highly anxious   Progress: Minimal  Response: Patient came to the group for 5 to 10 minutes. TW checked in with patient after the group. He said he got very anxious and thought he was going to have a panic attack so he left the group early. Patient was given positive feedback for coming to group to 5 to 10 minutes.   Plan: patient will be encouraged to continue to attend groups     Patients Problems:   Patient Active Problem List   Diagnosis    Suicidal ideation

## 2022-11-16 NOTE — Plan of Care (Signed)
Problem: Pain interferes with ability to perform ADL  Goal: Pain at adequate level as identified by patient  Description: Interventions:  1. Identify patient comfort function goal  2. Evaluate if patient comfort function goal is met  3. Assess pain on admission, during daily assessment and/or before any "as needed" intervention(s)  4. Reassess pain within 30-60 minutes of any procedure/intervention, per Pain Assessment, Intervention, Reassessment (AIR) Cycle  5. Evaluate patient's satisfaction with pain management progress  6. Offer non-pharmacological pain management interventions  7. Consult/collaborate with Pain Service  8. Consult/collaborate with Physical Therapy, Occupational Therapy, and/or Speech Therapy  9. Assess for risk of opioid induced respiratory depression and side effects, including snoring/sleep apnea. Alert healthcare team of risk factors identified.  10. Include patient/patient care companion in decisions related to pain management as needed  Outcome: Progressing:  Gary [name] identified his pain at an adequate level during this Day Shift.     Problem: IP Suicidal Ideation  Description: Gary [name] endorses suicidal ideation with plan to jump off bridge  Goal: LTG: Gary [name] will exhibit compliance with therapy  Outcome: Progressing:  Gary [name] exhibited compliance with therapy during this Day Shift.  Goal: STG: Gary [name] will not engage in self-injurious activities  Outcome: Progressing:  Gary [name] did not engage in self-injurious activities or attempted to do so during this Day Shift.    Upon entering his room this morning, Gary [name] was lying in bed with his eyes closed which he readily opened when addressed by name.  He was receptive to greeting from Gary [name].  He presented with a constricted affect.  He maintained good eye contact during Daily Shift Assessment.  He is A+O x 4.  He stated his name and DOB prior to receiving his scheduled AM medications which also included Wellbutrin XL 150 MG PO.  He denied HI/AVH or thoughts of  self-harm, but, again, endorsed fleeting thoughts of SI, without having a plan.  He continues to have cravings for alcohol. He has been visible in the Milieu either attending group, lingering on Unit Hallway, or sitting in the [employer] where he was observed interacting with his peers or watching TV.  He made his needs known including his request for PRN Vistaril 100 MG PO for anxiety which he rated 8/10, and, upon reassessment, he rated it 4/10.  He has been pleasant when approached.  He attended to his ADL's.  Gary [name] has maintained safety.  RN will monitor for continued safe behavior.     "I, Gary [name], hereby attest that I have been made aware of and reviewed the following for this patient:    - Biopsychosocial assessment;  - Diagnoses;  - Nursing needs and medical support, if applicable; and  - Treatment Plan Goals, Objectives and Strategies/Interventions.    I am involved in the care of this patient within my scope of practice and assigned tasks. I approve the treatment plan and this attestation is in lieu of a signature on the document."

## 2022-11-16 NOTE — Plan of Care (Signed)
Problem: IP Suicidal Ideation  Description: Amey endorses suicidal ideation with plan to jump off bridge  Goal: LTG: Cheston will exhibit compliance with therapy  Outcome: Progressing    Goal: STG: Zebedee will not engage in self-injurious activities  Outcome: Progressing  Goal: STG: Nathaniel will identify 3 internal coping strategies  Outcome: Progressing     Eleno was A+Ox4 with constricted affect and clear speech. Linsey endorses passive SI without plan. He denies HI/AVH. BVC is 0. He states he is committed to maintaining their safety, encouraged to come to staff if thoughts or urges to harm self or others increases, Giovoni verbalizes understanding. He is calm and cooperative with Clinical research associate, able to answer questions and maintain good eye contact. Pt describes his mood as "fine". He denies issues with sleep/energy/appetite.  Eduin rated his depression 5/10 and anxiety 5/10. Josua is compliant with medication administration and mouth check completed. Vistaril 100mg  given prn for anxiety at 2221 . Roen was visible in the milieu and was able to interact with peers appropriately.He was able to rest comfortably and slept for about 6 hours with no respiratory distress. Q15 min safety checks maintained. Will continue to monitor and ensure safety.

## 2022-11-16 NOTE — Progress Notes (Signed)
Psychiatry Progress Note  (Level 1 = Problem Focused, Level 2 = Expanded Problem Focused, Level 3 = Detailed)    Patient Name: Gary [name].            Current Date/Time:  7/30/20244:01 PM  MRN:  [id number]                            Attending Physician: Farris Has, MD  DOB: 08/30/1980                                Gender: male                              I. History   Informants: Patient, medical record, treatment team   A. Chief Complaint or Reason for Admission       Admitted for worsening depression and suicidal ideation.    B.History of Present Illness: Interval History     (Symptoms and qualifiers:1 for Level 1, 2 for Level 2 and 3+ for Level 3)  Patient is a 42 y.o. divorced, un-domiciled male with history of depression, anxiety, and alcohol use disorder admitted for worsening depression and suicidal ideation with a plan to jump off a bridge. Patient was evaluated by the [facility] and is currently admitted voluntarily.     November 16, 2022  Patient reports that his mood has improved somewhat over the course of this admission but he continues to express feeling down with passive thoughts of death ("sometimes think about going to sleep and not waking up"). He denies any active plans nor intent to act on these thoughts and he denies urges to self-harm. Naltrexone was discontinued yesterday at patient's request because he did not find the medication to be helpful for his cravings. He is tolerating the increased dose of gabapentin well and he reports slight benefit in anxiety, though has continued to utilize PRN hydroxyzine. He has been more visible in the milieu and has been socializing appropriately with peers and selectively attending groups. Denies HI/AVH.     C.Medical History  Review of Systems  (Level 1 is none, Level 2 is 1, and Level 3 is 2+)  A complete 14 point ROS was done and was negative except for    Psychiatric: Depression, Anxiety,   Constitutional: craving for alcohol     D.Additional Past  Psychiatric, Substance Use, Medical, Family and Social History  Psychiatric: No new information available as compared to previous encounter(s)  Substance Use: No new information available as compared to previous encounter(s)  Medical: No new information available as compared to previous encounter(s)  Family: No new information available as compared to previous encounter(s)  Social: No new information available as compared to previous encounter(s)    Medications:   Current Medications       Scheduled       Medication Ordered Dose/Rate, Route, Frequency Last Action    buPROPion XL (WELLBUTRIN XL) 24 hr tablet 150 mg    150 mg, PO, Daily Given, 150 mg at 07/30 0825    folic acid (FOLVITE) tablet 1 mg    1 mg, PO, Daily Given, 1 mg at 07/30 0825    gabapentin (NEURONTIN) capsule 600 mg    600 mg, PO, Q8H SCH Given, 600 mg at 07/30 1420    mirtazapine (REMERON) tablet 45  mg    45 mg, PO, QHS Given, 45 mg at 07/29 2221    pantoprazole (PROTONIX) EC tablet 40 mg    40 mg, PO, QAM AC Given, 40 mg at 07/30 0825    thiamine (VITAMIN B1) tablet 100 mg    100 mg, PO, Daily Given, 100 mg at 07/30 0825    vitamins/minerals tablet 1 tablet    1 tablet, PO, Daily Given, 1 tablet at 07/30 0825              PRN       Medication Ordered Dose/Rate, Route, Frequency Last Action    acetaminophen (TYLENOL) tablet 650 mg    650 mg, PO, Q6H PRN Given, 650 mg at 07/30 1420    alum & mag hydroxide-simethicone (MAALOX PLUS) 200-200-20 mg/5 mL suspension 30 mL    30 mL, PO, Q6H PRN Given, 30 mL at 07/28 1404    diphenhydrAMINE (BENADRYL) capsule 50 mg (Or Linked Group #1)    50 mg, PO, Once PRN Ordered    diphenhydrAMINE (BENADRYL) injection 50 mg (Or Linked Group #1)    50 mg, IM, Once PRN Ordered    hydrOXYzine (VISTARIL) capsule 100 mg    100 mg, PO, Q6H PRN Given, 100 mg at 07/30 1140    ibuprofen (ADVIL) tablet 400 mg    400 mg, PO, Q6H PRN Ordered    ondansetron (ZOFRAN-ODT) disintegrating tablet 4 mg    4 mg, PO, Q8H PRN Ordered     polyethylene glycol (MIRALAX) packet 17 g    17 g, PO, Daily PRN Ordered    QUEtiapine (SEROquel) tablet 25 mg    25 mg, PO, BID PRN Ordered                    II. Examination   Vital signs reviewed:   Blood pressure 120/82, pulse 91, temperature 98.8 F (37.1 C), temperature source Oral, resp. rate 16, height 1.702 m (5\' 7" ), weight 68 kg (150 lb), SpO2 95%.     Mental Status Exam  (Level 1 is 1-5, Level 2 is 6-8, Level 3 is 9+)  General appearance: Appears chronological age, Good hygiene, and Good grooming  Attitude/Behavior: Cooperative, Eye Contact is  Good, and calm  Motor: No abnormalities noted  Gait: No obvious abnormalities  Muscle strength and tone: grossly intact  Speech:   Spontaneous: Yes  Rate and Rhythm: Normal  Volume: Normal  Tone: Normal  Clarity: Yes  Mood: "about the same; still depressed but a little better than before"  Affect:   Range: Full  Stability: Stable  Appropriateness to thought content: Yes  Intensity: Normal  Brighter affect than before, joking and laughing at times  Thought Process:     Linear    Coherent: Yes  Logical:  Yes  Associations: Goal-directed  Thought Content:   Delusions:  Not elicited  Depressive Cognitions:   Worthless, and Guilt, some hope expressed  Suicidal:  denies today  Homicidal:  No homicidal thoughts  Violent Thoughts:  No  Perceptions:   Hallucinations: No  Insight: Fair  Judgment: Fair  Cognition:   Level of Consciousness: Intact  Orientation: Intact to self, place and time    Psychiatric / Cognitive Instruments: None    Pertinent Physical Exam: Not relevant to current chief complaint/reason for admission    Imaging / EKG / Labs: Labs in the last 24 hours  Results       ** No results found for  the last 24 hours. **          EKG Results   Cardiology Results       Procedure Component Value Units Date/Time    ECG 12 lead (Electrocardiogram) [[id number]] Collected: 10/28/22 1740     Updated: 10/28/22 1753     Ventricular Rate 103 BPM      Atrial Rate 103 BPM       P-R Interval 148 ms      QRS Duration 98 ms      Q-T Interval 334 ms      QTC Calculation (Bezet) 437 ms      P Axis 47 degrees      R Axis 49 degrees      T Axis 37 degrees      [facility] MUSE NARRATIVE AND IMPRESSION --     SINUS TACHYCARDIA  OTHERWISE NORMAL ECG  NO PREVIOUS ECGS AVAILABLE  Confirmed by Neomia Dear MD, Taysean ([id number]) on 10/28/2022 5:53:08 PM      Narrative:      SINUS TACHYCARDIA  OTHERWISE NORMAL ECG  NO PREVIOUS ECGS AVAILABLE  Confirmed by Neomia Dear MD, Ethon ([id number]) on 10/28/2022 5:53:08 PM          Brain Scan No results found for any visits on 10/28/22.    III. Assessment and Plan (Medical Decision Making)     1. I certify that this patient continues to require inpatient hospitalization due to acute risk to self and unable to care for self in the community with insufficient support available    2. Psychiatric Diagnoses  Depression, unspecified  Rule out MDD vs substance-induced mood disorder  Social Anxiety Disorder  Alcohol Use Disorder  Rule out PTSD    3. Additional Diagnoses    4.All diagnostic procedures completed since admission were reviewed. Past medical records reviewed. Coordination of care was discussed with inpatient team and as available with the outpatient team.    5. On this admission patient educated about and provided input into their treatment plan.  Patient understands potential risks and benefits of proposed treatment plan.     6.  Assessment / Impression  Patient is a 42 y.o. divorced, un-domiciled male with history of depression, anxiety, and alcohol use disorder admitted for worsening depression and suicidal ideation with a plan to jump off a bridge. Patient was evaluated by the [facility] and is currently admitted voluntarily.     Patient reports mild improvements in depression and anxiety though he continues to endorse passive suicidal thoughts and cravings for alcohol. He is tolerating his current medication regimen and he has been more visible in the milieu. He denies active SI/HI/AVH and he has  maintained safe behavior on the unit.     At this time, the patient would continue to benefit from inpatient psychiatric hospitalization for safety, stabilization, diagnostic clarification, medication management, and discharge planning. SW assisting with placement/coordinating safe discharge plan.     Suicide Risk Assessment  - Passive SI; no active plan nor intent     Plan / Recommendations:  Biological Plan:  Medications:   Mirtazapine 45 mg qhs  Gabapentin 600 mg TID (increased on 7/29)  Wellbutrin XL 150 mg daily  Vistaril 100 mg q6h PRN for anxiety  Scheduled Meds:  Current Facility-Administered Medications   Medication Dose Route Frequency    buPROPion XL  150 mg Oral Daily    folic acid  1 mg Oral Daily    gabapentin  600 mg Oral [facility]  mirtazapine  45 mg Oral QHS    pantoprazole  40 mg Oral QAM AC    thiamine  100 mg Oral Daily    vitamins/minerals  1 tablet Oral Daily     Continuous Infusions:  PRN Meds:.acetaminophen, alum & mag hydroxide-simethicone, diphenhydrAMINE **OR** diphenhydrAMINE, hydrOXYzine, ibuprofen, ondansetron, polyethylene glycol, QUEtiapine    Medical Work-up:  as per unit FNP  Consults: IM and Urology teams consulted and appreciated    Psychosocial Plan:  Individual therapy: Psycho-education and psychotherapy of the following modalities will continue to be provided during daily visits:Supportive, Cognitive Behavioral Therapy, and Insight Oriented Psychotherapy  Group and milieu therapies: daily per unit's schedule.  Continue with Social Work intervention provided for discharge planning including assistance with placement and follow-up psychiatric care.    Plan for Family Involvement: None Available  Other Providers Contact Information and Dates Contacted: No Updates    I personally saw and examined the patient and spent 35 minutes on this visit reviewing previous notes, counseling the patient and/or family members regarding the patient's condition(s), ordering of tests, managing  medications, and documenting the findings in the note.   Patient was also seen by Dr. Merlinda Frederick. The assessment and plan were discussed and formulated together and has been reviewed and updated as needed.      Signed by: Eldridge Dace, NP  11/16/2022  4:01 PM

## 2022-11-17 DIAGNOSIS — F419 Anxiety disorder, unspecified: Secondary | ICD-10-CM | POA: Insufficient documentation

## 2022-11-17 DIAGNOSIS — F1090 Alcohol use, unspecified, uncomplicated: Secondary | ICD-10-CM | POA: Diagnosis present

## 2022-11-17 DIAGNOSIS — K219 Gastro-esophageal reflux disease without esophagitis: Secondary | ICD-10-CM | POA: Insufficient documentation

## 2022-11-17 DIAGNOSIS — F332 Major depressive disorder, recurrent severe without psychotic features: Principal | ICD-10-CM | POA: Diagnosis present

## 2022-11-17 DIAGNOSIS — K5900 Constipation, unspecified: Secondary | ICD-10-CM | POA: Insufficient documentation

## 2022-11-17 DIAGNOSIS — F4010 Social phobia, unspecified: Secondary | ICD-10-CM | POA: Insufficient documentation

## 2022-11-17 DIAGNOSIS — R52 Pain, unspecified: Secondary | ICD-10-CM | POA: Insufficient documentation

## 2022-11-17 MED ORDER — THIAMINE HCL 100 MG PO TABS
100.0000 mg | ORAL_TABLET | Freq: Every day | ORAL | 0 refills | Status: AC
Start: 2022-11-18 — End: ?

## 2022-11-17 MED ORDER — VITAMINS/MINERALS PO TABS
1.0000 | ORAL_TABLET | Freq: Every day | ORAL | 0 refills | Status: AC
Start: 2022-11-18 — End: 2022-12-18

## 2022-11-17 MED ORDER — BUPROPION HCL ER (XL) 150 MG PO TB24
150.0000 mg | ORAL_TABLET | Freq: Every day | ORAL | 0 refills | Status: AC
Start: 2022-11-18 — End: ?

## 2022-11-17 MED ORDER — POLYETHYLENE GLYCOL 3350 17 G PO PACK
17.0000 g | PACK | Freq: Every day | ORAL | 0 refills | Status: AC | PRN
Start: 2022-11-17 — End: ?

## 2022-11-17 MED ORDER — GABAPENTIN 300 MG PO CAPS
600.0000 mg | ORAL_CAPSULE | Freq: Three times a day (TID) | ORAL | 0 refills | Status: AC
Start: 2022-11-17 — End: ?

## 2022-11-17 MED ORDER — MIRTAZAPINE 45 MG PO TABS
45.0000 mg | ORAL_TABLET | Freq: Every evening | ORAL | 0 refills | Status: AC
Start: 2022-11-17 — End: ?

## 2022-11-17 MED ORDER — FOLIC ACID 1 MG PO TABS
1.0000 mg | ORAL_TABLET | Freq: Every day | ORAL | 0 refills | Status: AC
Start: 2022-11-18 — End: ?

## 2022-11-17 MED ORDER — HYDROXYZINE PAMOATE 25 MG PO CAPS
100.0000 mg | ORAL_CAPSULE | Freq: Three times a day (TID) | ORAL | Status: DC | PRN
Start: 2022-11-17 — End: 2022-11-17

## 2022-11-17 MED ORDER — PANTOPRAZOLE SODIUM 40 MG PO TBEC
40.0000 mg | DELAYED_RELEASE_TABLET | Freq: Every morning | ORAL | 0 refills | Status: AC
Start: 2022-11-18 — End: ?

## 2022-11-17 MED ORDER — ACETAMINOPHEN 325 MG PO TABS
650.0000 mg | ORAL_TABLET | Freq: Four times a day (QID) | ORAL | Status: AC | PRN
Start: 2022-11-17 — End: ?

## 2022-11-17 MED ORDER — HYDROXYZINE PAMOATE 100 MG PO CAPS
100.0000 mg | ORAL_CAPSULE | Freq: Three times a day (TID) | ORAL | 0 refills | Status: AC | PRN
Start: 2022-11-17 — End: ?

## 2022-11-17 NOTE — Discharge Summary (Signed)
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 medications needed.     Medical:    Patient was co-managed by Internal Medicine and Urology teams for concerns of abdominal pain, dysuria, and CT noting non-obstructing right-sided nephrolithiasis.     Per Urology consult note dated 7/23 (Please see note authored by Dr. Andrey Campanile on 7/23 for more information):   Per chart review and imaging dating back to 2018 presence, location and size of right renal stone with associated calyceal dilation has remained stable. In addition, multiple recent UA's have been negative for infection. No acute Urologic intervention recommended at this time given the above  Recommend outpatient follow up with his established urologist, in [place], to discuss stone treatment    Per Medicine recommendations on 7/29 (Please see note authored by Dr. Pura Spice on 7/29 for more information):    With history of recent 1.6 cm stone, concern for persistent nephrolithiasis. Patient is still urinating well. Patient has dysuria but otherwise no indication of infection/sepsis (afebrile, no subjective chills, nausea). Low suspicion for acute surgical pathology I.e. testicular torsion, appendicitis, based on exam and history. May also be a component of constipation and gas pain contributing to his abdominal discomfort, given his distention and tympany. Still passing flatus, so low suspicion for complete obstruction  - CBC with diff, CMP, UA ordered  - Follow up CT ab/p  - If <10 mm stone present without significant obstruction, would  proceed with pain management, hydration +/- alpha blocker  - If obstruction, significant AKI, or intractable pain, may require urologic intervention  - Encourage PO fluid consumption, goal UOP>2500 mL/day  - Pain management: continue acetaminophen 650 mg PRN, ibuprofen 800 mg q6h PRN              - Added oxycodone 5 mg q6h, one-time dilaudid 1 mg for breakthrough  - Can defer antibiotics pending UA  - Bowel regimen: Miralax once daily, can increase to BID, recommend adding Pericolace BID PRN  7/24: Appreciate Urology input.  I think the abdominal pain which is mostly in the RUQ and Epigastric region with tenderness as well is probably due to alcoholic liver disease.  I've advised the patient to quit drinking which should help him.  Hep Panel is negative.  F/u Lipase but doubt pancreatitis coz the CT did not show signs of it.  LFTs are elevated, but not the typical Alcohol pattern. Will follow LFTs in 1-2 days.  May get US Liver if pain does not improve or if LFTs trending up.  7/25: Still c/o 9-10/10 Abdominal pain. Pain is out of proportion to signs and symptoms.  Will get a RUQ [place]. F/u LFTs in AM.  I told him to limit the amout of ibuprofen he is using.  7/26: [place] -ve, LFTs still elevated.  Bladder Scan -  ve.  7/27: Abdominal Pain. Etiology not clear.  LFTs elevated so could be alcoholic liver disease. US Abdomen -ve.  PRN Ibuprofen(dose reduced today).  Will f/u LFTs on Monday.  7/28: No new complaints. F/u LFTs in AM  7/29: No new complaints. LFTs stable.  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 Needs f/u as outpatient and if it does not improve GI follow up as outpatient.  Also needs Urology f/u for Kidney Stones.    Patient was continued on multivtiamin, thiamine, and folic acid supplementation for alcohol use disorder, and Protonix 40 mg q AM for GERD    On the day of discharge:  Patient appeared with brighter affect and displayed better insight. Patient reported improvements in depression and anxiety was more  manageable. Patient did not appear overtly psychotic or manic. Patient denied active SI/HI and was future oriented. Patient was maintaining compliance with medications. Patient was maintaining ADL's, eating well, and sleeping consistently. Patient was medically stable with no acute distress.     Patient's admission problem is stabilized, and patient no longer appears to be an imminent danger to himself or others and appeared to achieve maximal benefit of psychiatric hospitalization.     Treatment team determines patient's needs can be met at an alternate level of care. Patient no longer requires intensity of services to achieve goals and does not meet criteria for continued inpatient psychiatric hospitalization.     Treatment team discussed the following topics with the patient in preparation for discharge:   - Reviewed, assessed, provided psychotherapy daily. Therapeutic coping mechanisms reviewed. Safety planning reviewed.   - Patient is future focused (I.e. planning to attend [facility] for crisis care, interested in eventually finding employment)  - Has f/u scheduled with psychiatrist [employer]).   - Patient denies active SI, HI, AVH. Pt states has no access to firearms. Pt is stable for discharge.   - Pt verbalized ability and plan to follow up.     Risks, benefits, and side effects of medications were reviewed with patient and patient verbalized understanding. Safety planning was reviewed. Plan for discharge was discussed and agreed upon with the treatment team.     Patient was discharged to [facility] ([address] [place], [place], [place] [address]) and follow-up was scheduled with [facility].    Medications at discharge:  Wellbutrin 150 mg daily  Gabapentin 600 mg TID  Remeron 45 mg qhs  Protonix 40 mg daily before breakfast  Multivitamin, thiamine, and folic acid supplements  Hydroxyzine 100 mg up to TID PRN for anxiety  Scheduled Meds:  Current Facility-Administered  Medications   Medication Dose Route Frequency    buPROPion XL  150 mg Oral Daily    folic acid  1 mg Oral Daily    gabapentin  600 mg Oral Q8H SCH    mirtazapine  45 mg Oral QHS    pantoprazole  40 mg Oral QAM AC    thiamine  100 mg Oral Daily    vitamins/minerals  1 tablet Oral Daily     Continuous Infusions:  PRN Meds:.acetaminophen, alum & mag hydroxide-simethicone, diphenhydrAMINE **OR** diphenhydrAMINE, hydrOXYzine, ibuprofen, ondansetron, polyethylene glycol, QUEtiapine    Future plans for medication: To be managed by outpatient psychiatric provider    Please follow up with outpatient psychiatric provider for medication management, titration of medication dose based on presenting sx and to obtain additional refills.    Considerations for future care:    Patient has outpatient behavior health follow up appointments  scheduled and verbalized the ability and plan to follow up.    This patient maintains a chronic elevated risk of suicidality due to maladaptive characterological traits, impulsivity and rejection sensitivity, and chronic psychosocial stressors.     Suicidal and Homicidal Status on Discharge:   Patient denies suicidal and homicidal ideation, intent and plan.    "64" is the three-digit, nationwide phone number to connect directly to the 988 Suicide and Crisis Lifeline. By calling or texting 41, you'll connect with mental health professionals with the 62 Suicide and Crisis Lifeline, formerly known as the [employer] Suicide Psychologist, sport and exercise. Veterans can press "1" after dialing 988 to connect directly to the [employer] which serves our [employer], service members, [employer] and [employer], and those who support them. For texts, Veterans should continue to text the [employer] short code: [phone].    Discharge Instructions:   Discharge Disposition:  Crisis center [facility] in [place], [place])    Discharge Instructions given to: Patient and  Receiving Institution    Questions that may arise between hospital discharge and your first follow-up appointment should be directed to [facility] Mental Health Emergency Services: [phone]; 911 or the closest emergency department    Case discussed with:inpatient treatment team   Discharge Plan:   Patient will be discharged to [facility] at [address], [place], [place] [address].     RECOMMENDED THAT YOU COMPLY WITH THE FOLLOWING PLAN:   Follow up appointment with: [facility] with Dr. Mattie Marlin NP [place] Agyemang                   Date: December 01, 2022      Time: 8:15 AM  Location:  [address] [place], [place] [address]  P: [phone]  F: [phone]     (Please arrive 30 minutes early for your appointment)        (RECOMMENDED THAT YOU COMPLY WITH THE FOLLOWING PLAN:      Contact [facility]-   [address]  [address]  [place], [place] [address]  [phone]         Other resources available for you:     [facility] for the [facility]  Location: [address] [place] [address]   P: [phone]  Services provided include:    Case management  Support with locating housing, etc.      [employer] number  [phone])     [facility] [place] [facility] Hotline # (215)067-0399     Another option is to call [place] 2-1-1. You can call toll free from any phone.  Call specialists are trained to assist people faced with housing needs, utility shutoffs, family crisis, financial, legal, employment and other issues.  The database has information about nearly 5,000 agencies providing services statewide.     [facility]  [address] [place], [place], [place] [address]   [phone]  (Please contact to follow-up on your referral)      Psychology Today  (If interested in a therapist, please contact someone from the list provided to you, or search www.psychologytoday.com to get  connected with a therapist.)     FOR EMERGENCY MENTAL HEALTH or Substance Abuse Appointment, CONTACT [facility] at [phone] or your local community services board emergency mental health/substance abuse at: [facility]; [phone]).      The Crisis Center provides free crisis services  24 hours a day/ 365 days a year. Services are provided by telephone [phone]) or in person at [address] in [place] (no appointment needed). Mobile Crisis Team (MCT) provides emergency crisis evaluations for individuals who are experiencing a mental health crisis.   Full crisis assessments and treatment referrals are provided for all crises, both psychiatric and situational.     [facility]  [phone] [place] [address]  Within [place]:  311  24-Hour Crisis Center: [phone]  Hours of Operation: Monday - Friday     8:30 am - 5:00 pm [facility] 24/7)        988 Suicide and Crisis Lifeline     TOBACCO CESSATION DISCHARGE REFERRAL      please Contact QUIT NOW on Tuesday between 1-3pm at [phone]).           [facility] [employer] on Mental Illness  [facility] [place]  Address:[facility] [place]  [address]  [place], [place] [address]  Phone:([phone]  Email Address:[email or web address]  Website:http://[email or web address]-[email or web address]  Serving:Counties [place], [place], [place]; Cities [place], [place] and [address]         Depression and Bipolar Support Alliance  [facility] [place] provides free weekly mental health support groups.     We currently offer groups M-W from 7-9PM.  If you'd like to join a group, email [place] indicating what day works best and we will add you to the invite list.     All groups are led by Peer Support Specialists facilitators who have lived-experience with mental health challenges.     All meetings are virtual via Zoom so people can participate from any part of [place].     For assistance on joining a support group,  please email:  [email or web address]     Our groups:   Monday Evening 7:00PM to 9:00PM  General Support/Wellness Group  Facilitators:  Concepcion Living     Tuesday Evening 7:00PM to 9:00PM  General Support/Wellness Group  Facilitators:  Conce[facility]ion Living     Wednesday Evening 7:00PM to 9:00 PM  General Support/Wellness Group  Facilitators:  Remi Esordi  Attestation:     Patient discharged on more than 1 antipsychotic medication? No  On discharge, was the patient on any psychotropic medication off label? Yes. The patient was fully informed, understands and consented.The medication(s)  is gabapentin.  I spent at least 35 minutes coordinating the discharge and reviewing the discharge plan. Patient was also seen by Dr. Merlinda Frederick.     Discharge Medications:   A 30-day supply of patient's medications, including Hydroxyzine 100 mg TID PRN for anxiety, were e-prescribed to [facility] [place] - [address] with 0 refills.     Tobacco Cessation Discharge Prescription for Cessation Medication  Patient was assessed on admission and found not to be a tobacco user       Discharge Medication List        Taking      acetaminophen 325 MG tablet  Dose: 650 mg  Commonly known as: TYLENOL  For: Pain  Take 2 tablets (650 mg) by mouth every 6 (six) hours as needed for Pain     buPROPion XL 150 MG 24 hr tablet  Dose: 150 mg  Commonly known as: WELLBUTRIN XL  For: Depression, Major Depressive Disorder  Start taking on: November 18, 2022  Take 1 tablet (150 mg) by mouth daily     folic acid 1 MG tablet  Dose:  1 mg  Commonly known as: FOLVITE  Start taking on: November 18, 2022  Take 1 tablet (1 mg) by mouth daily     gabapentin 300 MG capsule  Dose: 600 mg  Commonly known as: NEURONTIN  For: Abuse or Misuse of Alcohol, Generalized Anxiety Disorder, Social Anxiety Disorder  Take 2 capsules (600 mg) by mouth every 8 (eight) hours     hydrOXYzine 100 MG capsule  Dose: 100 mg  Commonly known as: VISTARIL  For: Feeling  Anxious  Take 1 capsule (100 mg) by mouth every 8 (eight) hours as needed for Anxiety     mirtazapine 45 MG tablet  Dose: 45 mg  Commonly known as: REMERON  For: Major Depressive Disorder, Panic Disorder  Take 1 tablet (45 mg) by mouth nightly     pantoprazole 40 MG tablet  Dose: 40 mg  Commonly known as: PROTONIX  For: Gastroesophageal Reflux Disease  Start taking on: November 18, 2022  Take 1 tablet (40 mg) by mouth every morning before breakfast     polyethylene glycol 17 g packet  Dose: 17 g  Commonly known as: MIRALAX  For: Constipation  Take 17 g by mouth daily as needed (CONSTIPATION)     thiamine 100 MG tablet  Dose: 100 mg  Commonly known as: B-1  For: Deficiency of Vitamin B1  Start taking on: November 18, 2022  Take 1 tablet (100 mg) by mouth daily     vitamins/minerals Tabs  Dose: 1 tablet  Start taking on: November 18, 2022  Take 1 tablet by mouth daily            STOP taking these medications      ibuprofen 800 MG tablet  Commonly known as: ADVIL     omeprazole 40 MG capsule  Commonly known as: Solicitor by: Eldridge Dace, NP   11/17/2022  10:07 AM

## 2022-11-17 NOTE — Plan of Care (Signed)
Problem: IP Suicidal Ideation  Description: Gary [name] endorses suicidal ideation with plan to jump off bridge  Goal: LTG: Gary [name] will exhibit compliance with therapy  Outcome: Progressing  Gary [name] was able to attend groups and complied to his medications.  Goal: STG: Gary [name] will not engage in self-injurious activities  Outcome: Progressing  Gary [name] denies acting on his passive SI.  Goal: STG: Gary [name] will identify 3 internal coping strategies  Outcome: Progressing      Gary [name] is working on his Pharmacologist.    Gary [name] was A+Ox4 with full affect and clear speech. He still have passive SI but no plan. Gary [name] is 0.He states the is committed to maintaining their safety, encouraged to come to staff if thoughts or urges to harm self or others increases, Gary [name] verbalizes understanding. Gary [name] is calm and cooperative with Gary research associate, able to answer questions and maintain good eye contact. He describes his mood as "alright". He denies issues with sleep/energy/appetite.  Gary [name] rated his depression 9/10 and anxiety as 5/10  Gary [name] is compliant with medication administration and mouth check completed. Gary [name] was visible in the milieu throughout the shifand interacted with peers and staff appropriately. He was allowed to use his personal phone to message his brother for safe discharge as ordered by physician with the presence of staff. He was able to rest comfortably and slept for  6 hours with no respiratory distress. Q15 min safety checks maintained. Will continue to monitor and ensure safety.

## 2022-11-17 NOTE — Progress Notes (Addendum)
The Pt is scheduled to discharge today, 11/17/22 at 3PM.     A cab will be requested for Pt to be dropped off at [facility] [place], [place], [place] [address].)    The SW has completed the referral for [facility] [phone] [place], [place], [place] [address],  [phone]) and [facility] Address: [address] [place], [place], [place] [address], [phone])    [facility] the [facility] location. And a 30-day supply of medications has been sent to [facility] [phone] [place] [address] Phone# [phone] fax# [phone])    The SW spoke with a representative from [facility] [place], [phone]) to complete the required pre-screen. Dahlia Client stated that the Pt should arrive with at least a 30-day supply of medication, which was approved by the Pt's doctor. [facility] Client emphasized that medication compliance is a key expectation for their program. She also stated that they can connect the Pt to other rehabilitation centers. She also stated that they will conduct occasional breathalyzer tests, and the Pt must adhere to curfew; 6 PM. Additionally, they will assist the Pt in managing his medications.    [facility]ia Client stated the facility also has a crisis center that can help the Pt's with housing referrals and access to social services if needed. The initial stay will be for 10 days, with the possibility of extending for an additional 20 days if necessary and approved by his insurance.    RECOMMENDED THAT YOU COMPLY WITH THE FOLLOWING PLAN:   Follow up appointment with: [facility] with Dr. Mattie Marlin NP [place] Agyemang                   Date: December 01, 2022      Time: 8:15 AM  Location:  [address] [place], [place] [address]  P: [phone]  F: [phone]       SW also provided pt with a list of other housing and food resources.    [facility] for the [facility]  Location: [address] [place] [address]   P: [phone]  Services provided include:    Case management  Support with locating housing, etc.     [employer] number  4307720522)    [facility] [place] Homeless shelter Hotline # 414-581-4058    Another option is to call [place] 2-1-1. You can call toll free from any phone.  Call specialists are trained to assist people faced with housing needs, utility shutoffs, family crisis, financial, legal, employment and other issues.  The database has information about nearly 5,000 agencies providing services statewide.    [facility]  [address] [place], [place], [place] [address]   [phone]  (Please contact to follow-up on your referral)     Psychology Today  (If interested in a therapist, please contact someone from the list provided to you, or search www.psychologytoday.com to get connected with a therapist.)    SW will fax AVS and the Physician's Discharge Summary on 11/18/2022  to [facility]  At 3:00 PM. Fax # [phone]. And [employer], FAX# [phone]    Tyna Jaksch, MSW

## 2022-11-17 NOTE — Progress Notes (Signed)
Gary [name] received discharge order.  Status upon discharge (describe: mental status, safety, etc.):  Gary [name] is alert and oriented x4,with a labile affect and clear speech. Denies SI/HI/AVH/SIB urges. Gary [name] is compliant with medication administration.Patient was visible in the milieu and interacting with peers.Q 15 minutes safety monitoring maintained.  Safety plan was  completed and a copy given to patient. (if not, explain reason)    After Visit Summary, AVS, including prescribed medications, were reviewed with patient and patient was able to verbalize understanding of follow-up plan.  Patient was given a copy of their AVS and Adult Wellness, Recovery & Safety Plan.    Gary [name] was discharged to [facility]   Accompanied by alone     Mode of transportation: Taxi    Personal belongings were returned.

## 2022-11-17 NOTE — Group Note (Signed)
Group: BH - Creative  Group Topics: Expressive   Group Date: 11/17/2022  Start Time: 1500  End Time: 1600  Number of Participants: 7  Facilitators: Tonette Lederer T   Department: [facility] Professional Services Building [address]    Discipline Led: BHT  Number of Patient Participants: 7      Name: Gary [name]. Date of Birth: 11-28-1980   MR: [id number]      Level of Participation: active  Quality of Participation: attentive, cooperative, and motivated  Interactions with Others: gave feedback and supportive  Mood/Affect: brightens with interaction  Triggers (if applicable): N/A  Cognition: logical  Organization: completed task  Progress: Significant  Response: Pt was able to intergrade with others when pt has the chance to process and ground self.   Plan: patient will be encouraged to continue with post treatment.     Patients Problems:   Patient Active Problem List   Diagnosis    Suicidal ideation    Severe episode of recurrent major depressive disorder, without psychotic features    Social anxiety disorder    Anxiety disorder, unspecified    Alcohol use disorder    Pain    GERD (gastroesophageal reflux disease)    Constipation

## 2022-11-23 ENCOUNTER — Emergency Department
Admission: EM | Admit: 2022-11-23 | Discharge: 2022-11-23 | Disposition: A | Discharge status: Home / Self Care | Payer: Medicaid Other | Attending: Emergency Medicine | Admitting: Emergency Medicine

## 2022-11-23 DIAGNOSIS — F10129 Alcohol abuse with intoxication, unspecified: Secondary | ICD-10-CM | POA: Insufficient documentation

## 2022-11-23 DIAGNOSIS — R45851 Suicidal ideations: Secondary | ICD-10-CM | POA: Insufficient documentation

## 2022-11-23 DIAGNOSIS — F39 Unspecified mood [affective] disorder: Secondary | ICD-10-CM

## 2022-11-23 DIAGNOSIS — F10920 Alcohol use, unspecified with intoxication, uncomplicated: Secondary | ICD-10-CM

## 2022-11-23 LAB — LAB USE ONLY - CBC WITH DIFFERENTIAL
Absolute Basophils: 0.05 10*3/uL (ref 0.00–0.08)
Absolute Eosinophils: 0.16 10*3/uL (ref 0.00–0.44)
Absolute Immature Granulocytes: 0.01 10*3/uL (ref 0.00–0.07)
Absolute Lymphocytes: 2.7 10*3/uL (ref 0.42–3.22)
Absolute Monocytes: 1.53 10*3/uL — ABNORMAL HIGH (ref 0.21–0.85)
Absolute Neutrophils: 6.28 10*3/uL (ref 1.10–6.33)
Absolute nRBC: 0 10*3/uL (ref <=0.00)
Basophils %: 0.5 %
Eosinophils %: 1.5 %
Hematocrit: 39.7 % (ref 37.6–49.6)
Hemoglobin: 13.6 g/dL (ref 12.5–17.1)
Immature Granulocytes %: 0.1 %
Lymphocytes %: 25.2 %
MCH: 31.7 pg (ref 25.1–33.5)
MCHC: 34.3 g/dL (ref 31.5–35.8)
MCV: 92.5 fL (ref 78.0–96.0)
MPV: 9.2 fL (ref 8.9–12.5)
Monocytes %: 14.3 %
Neutrophils %: 58.4 %
Platelet Count: 315 10*3/uL (ref 142–346)
Preliminary Absolute Neutrophil Count: 6.28 10*3/uL (ref 1.10–6.33)
RBC: 4.29 10*6/uL (ref 4.20–5.90)
RDW: 15 % (ref 11–15)
WBC: 10.73 10*3/uL — ABNORMAL HIGH (ref 3.10–9.50)
nRBC %: 0 /100 WBC (ref <=0.0)

## 2022-11-23 LAB — URINE DRUGS OF ABUSE SCREEN
Urine Amphetamine Screen: NEGATIVE
Urine Barbituate Screen: NEGATIVE
Urine Benzodiazepine Screen: NEGATIVE
Urine Cannabinoid Screen: NEGATIVE
Urine Cocaine Screen: NEGATIVE
Urine Fentanyl Screen: NEGATIVE
Urine Opiate Screen: NEGATIVE
Urine PCP Screen: NEGATIVE

## 2022-11-23 LAB — COMPREHENSIVE METABOLIC PANEL
ALT: 85 U/L — ABNORMAL HIGH (ref 0–55)
AST (SGOT): 34 U/L (ref 5–41)
Albumin/Globulin Ratio: 1.3 (ref 0.9–2.2)
Albumin: 4 g/dL (ref 3.5–5.0)
Alkaline Phosphatase: 92 U/L (ref 37–117)
Anion Gap: 14 (ref 5.0–15.0)
BUN: 14 mg/dL (ref 9–28)
Bilirubin, Total: 0.4 mg/dL (ref 0.2–1.2)
CO2: 23 mEq/L (ref 17–29)
Calcium: 8.9 mg/dL (ref 8.5–10.5)
Chloride: 107 mEq/L (ref 99–111)
Creatinine: 0.8 mg/dL (ref 0.5–1.5)
GFR: >60 mL/min/{1.73_m2} (ref >=60.0)
Globulin: 3 g/dL (ref 2.0–3.6)
Glucose: 95 mg/dL (ref 70–100)
Potassium: 3.5 mEq/L (ref 3.5–5.3)
Protein, Total: 7 g/dL (ref 6.0–8.3)
Sodium: 144 mEq/L (ref 135–145)

## 2022-11-23 LAB — ETHANOL (ALCOHOL) LEVEL: Alcohol: 176 mg/dL — ABNORMAL HIGH

## 2022-11-23 NOTE — ED Notes (Signed)
Bed: E25  Expected date:   Expected time:   Means of arrival:   Comments:  Medic 411 SI

## 2022-11-23 NOTE — EDIE (Signed)
[facility]?NOTIFICATION?11/23/2022 00:23?Gary [name], Gary [name]?MRN: [id number]    [facility]'s patient encounter information:   IHK:?[id number]  Account [id number]  Billing Account [id number]      Criteria Met      3 Different Facilities in 90 Days    5 ED Visits in 12 Months    History of Alcohol Use Disorder (12 mo.)    History of Suicide Ideation or Self-Harm (12 mo.)    Security and Safety  No Security Events were found.  ED Care Guidelines  There are currently no ED Care Guidelines for this patient. Please check your facility's medical records system.        Prescription Monitoring Program  Narx Score not available at this time.    E.D. Visit Count (12 mo.)  Facility Visits   [facility] 4   [facility] 2   [facility] 1   [facility] 1   [facility] 1   [facility] 1   [facility] 1   Total 11   Note: Visits indicate total known visits.     Recent Emergency Department Visit Summary  Showing 10 most recent visits out of 11 in the past 12 months   Date Facility [facility] Type Diagnoses or Chief Complaint    Nov 23, 2022  [facility]  [place]  [place]  Emergency      Medic      Nov 21, 2022  [facility]  [place]  [place]  Emergency      Alcohol use, unspecified with intoxication, uncomplicated      Suicidal ideations      Adjustment disorder with depressed mood      Depression      Stomach Pain      Oct 28, 2022  [facility]  [place]  [place]  Emergency      Alcohol dependence with intoxication, uncomplicated      Suicidal ideations      Psychiatric Evaluation      Suicidal      triage      Oct 28, 2022  [facility]  [place]  [place]  Emergency      flank pain      Oct 21, 2022  [facility].  [place]  Emergency      Other specified abnormal findings of blood chemistry      Hypocalcemia      Suicidal ideations      Depression, unspecified      Calculus of kidney       Other microscopic hematuria      Suicidal Ideation      [facility]      Oct 19, 2022  [facility]  [place] .  [place]  Emergency      Depression      [place] depression      Oct 18, 2022  [facility].  [place]  Emergency      Alcohol use, unspecified with intoxication, uncomplicated      Alcohol Intoxication      SUICIDAL AND INTOXICATED.      SUDICIAL AND INTOXICATED.      Oct 16, 2022  [facility]  [place].  [place]  Emergency      Alcohol use, unspecified with intoxication, uncomplicated      Alcohol Intoxication      Pain,  Flank      side pain      Oct 14, 2022  [facility].  [place]  Emergency      Other specified personal risk factors, not elsewhere classified      Chest pain, unspecified      Unspecified renal colic      Abdominal Pain      lightheadedness and side pain      Oct 12, 2022  [facility] [place].  [place]  Emergency      Calculus of kidney      Other      ABNORMAL LABS, PAINFUL DIARRHEA        Recent Inpatient Visit Summary  No Recent Inpatient Visits were found.  Care Team  No Care Team was found.  [facility]  This patient has registered at the [facility] Emergency Department  For more information visit: [email or web address]     PLEASE NOTE:     1.   Any care recommendations and other clinical information are provided as guidelines or for historical purposes only, and providers should exercise their own clinical judgment when providing care.    2.   You may only use this information for purposes of treatment, payment or health care operations activities, and subject to the limitations of applicable [facility] Policies.    3.   You should consult directly with the organization that provided [name] care guideline or other clinical history with any questions about additional information or accuracy or completeness of information provided.    42 CFR Part 2 or state confidentiality law prohibits unauthorized disclosure of these records. This  information has been disclosed to you from the records whose confidentiality is or may be protected by state law. State law prohibits you from making any further disclosure of it without the specific written authorization of the person to whom it pertains, or as otherwise permitted by state law. You may view an electronic version of the patient's consent in the portal.   ? 2024 [facility] - [email or web address]

## 2022-11-23 NOTE — ED Notes (Signed)
Pt cal and cooperative, no signs of SI HI as of this time.

## 2022-11-23 NOTE — Progress Notes (Signed)
[facility] Access Reassessment       Reassessment start time: 1030 End time: 1130     Initial assessment date and time: 8/06 around 3AM    Initial presenting problem and justification for disposition: Pt self-referred to the ED with c/c of SI. Pt was intoxicated at the time with BAL of 176.      Diagnosis:   Preliminary Diagnosis #1: F39 Mood disorder  Preliminary Diagnosis #2: F10.920 Alcohol intoxication without complication     Reason for reassessment: Pt has a hx of recanting suicide-related statements when he sobers up.     C-SSRS risk level from initial consult: CSSRS Risk Level : High    Current suicide risk assessment (since last assessed)  Since last assessed, have you wished you were dead or wished you could go to sleep and not wake up? Yes    Since last assessed, have you actually had any thoughts of killing yourself? Yes    Since last assessed, have you been thinking about how you might do this? No  Since last assessed, have you had these thoughts and some intention of acting on them? No  Since last assessed, have you started to work out the details of how to kill yourself? Do you intend to carry out this plan? No    Changes to internal protective factors: Yes Pt reported that he promised a friend (that he met during last admission) that he would seek help when he is feeling suicidal.  Changes to external protective factors: Yes Pt expressed good future orientation, a help-seeking attitude, and did not express concerns for his safety in the community setting.     Behavior and presentation in past 24 hours: WNL  Agitation/Aggression? no  PRN meds required? no  Restraints used? no    Additional risk considerations (HI, psychosis, mania, ADLs): No HI, psychosis, mania; ADLs are intact. Risk level is currently at baseline with respect to SI.      Acces to lethal means: no, All psych meds were left at the sober living home that he left AMA from 3 days ago. Pt has no current access, but  that does not necessarily stop him from accessing something in the community.    Availability of less restrictive levels of care or OP follow-up: yes, Pt is physically and psychologically able to access OP follow-up via the CSB. He is agreeable to this plan.       Summary: Pt reassessed today by Clinical research associate. He is A&Ox4, cooperative with interview, and showing no s/sx of impaired judgement or intoxication. He recalls last night's [facility] consult, but notes that "[he] was pretty drunk" at that time. He shared the following timeline: On Aug 3rd he learned that his mother-in-law had passed away. He left the sober living facility AMA to pursue drinking alcohol again. He was assessed at [facility] ED on 8/4 and discharged in stable condition after denying SI and expressing no interest in services at that time. He has been spending time with a friend and drinking in that friend's car since then. He took the metro to the ED last night after experiencing SI.     This morning, he continues to endorsed passive SI with no specific plan nor intent. He states that he has SI at baseline and his current experience of SI is similar to the day of discharge from his most recent admission. He explicitly verbalized that he has no future intent towards attempting suicide in the next month. When  asked about prior suicide attempts, he notes that "there is always a small part of me that doesn't want to die." He is currently future-oriented, help-seeking, and has made a "promise" to a male friend that he met during last admission that he will seek help if he is experiencing SI. He is not interest in stopping alcohol use. He notes he does not want to go to another rehab and does not like group therapy.     When discussing LOC, he expressed no concern for his safety in the community setting. He verbalized that he knows how to access emergency resources if he needs help. He has called [phone] "more times that you could count" and agrees to utilize those services  in the future. Writer recommended connecting with the CSB since he plans on staying in the area. He is also agreeable to this plan. Writer provided brief safety planning and follow-up instructions in the AVS.    Collateral(s) contacted (names and phone numbers): N/A        Reassessment disposition: Recommended for discharge back to the community setting.      Justification for disposition: Pt's chronic risk of suicide is moderate but there does not appear to be any elevated acute risk of suicide. Based on a chart review and the [facility] consult from last night, Pt appears to be at his psychiatric baseline and would benefit from another IP psych admission. He remains able to meet his basic needs and access OP services. Pt expressed no concern for his safety in the community setting and is agreeable to OP follow-up. He has no access to lethal means at this time, but that does not preclude him for choosing to gain access in the future. Unfortunately that is impossible to control for.     Notifications (ED provider, RN, etc): Notified ED tx team Dr. Beau Fanny and RN Huntley Dec. All in agreement for discharge.     On-Call Psychiatrist Consult (if required): Discussed briefly with Dr. Merlinda Frederick who agrees that there is no benefit to admission this soon after d/c in the absence of an imminent safety concern.     Lethal Means Restriction Counseling (use ".LMRCDOCTEXT" SmartPhrase below if indicated):   No access to lethal means at this time.     Jerrel Ivory, LPC    [facility] Emergency [facility]  [address]  [place], [place] [address]  [phone]

## 2022-11-23 NOTE — Discharge Instructions (Addendum)
To Mr. Mackay Teufel:     Thank you for your time during the psychiatric consultation today. Psychiatric admission is not recommended for you today. Please see the following safety and follow-up recommendations below:     1) If you are going to stay in [facility], then I recommend connecting with the local CSB. They are often known as "[facility]" and are located at [address] [place], [place]. They have a walk-in intake program Monday - Thursday beginning from 8am - 3pm and Fridays from 10am - 3pm. This department is called "Entry and Referral" and is located on the lower level of the building. You can reach them via phone at [phone]. Once this intake is completed, they will establish appointments with the appropriate services or teams for you.     2) In the event of a mental health crisis, please use these resources:    A) [facility] has a 24/7 emergency services team located in the lower level of the same building. They can be reach at [phone].   B) Go to the nearest emergency room asap.   C) Call 911 or 988  D) Call another suicide hotline, such as the Crisis[facility] hotline at [phone]    3) It is recommended that you abstain from alcohol. If you are experiencing withdrawal symptoms after you stop drinking, we recommend going to the nearest emergency room or calling 911.

## 2022-11-23 NOTE — ED Notes (Signed)
Cart #1 placed in room for [facility] evaluation
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
## 2022-11-23 NOTE — ED Notes (Signed)
Pt asleep, no signs of SI HI as of this time.

## 2022-11-23 NOTE — ED Notes (Signed)
Pt will be re-evaluated by [facility] once sober, Pt showing no signs SI HI as of this time.

## 2022-11-24 ENCOUNTER — Emergency Department: Payer: Medicaid Other

## 2022-11-24 ENCOUNTER — Emergency Department
Admission: EM | Admit: 2022-11-24 | Discharge: 2022-11-24 | Disposition: A | Discharge status: Home / Self Care | Payer: Medicaid Other | Attending: Emergency Medicine | Admitting: Emergency Medicine

## 2022-11-24 DIAGNOSIS — R8281 Pyuria: Secondary | ICD-10-CM | POA: Insufficient documentation

## 2022-11-24 DIAGNOSIS — R300 Dysuria: Secondary | ICD-10-CM | POA: Insufficient documentation

## 2022-11-24 DIAGNOSIS — R109 Unspecified abdominal pain: Secondary | ICD-10-CM | POA: Insufficient documentation

## 2022-11-24 LAB — LAB USE ONLY - URINALYSIS WITH REFLEX TO MICROSCOPIC EXAM AND CULTURE
Urine Bilirubin: NEGATIVE
Urine Blood: NEGATIVE
Urine Glucose: NEGATIVE
Urine Nitrite: NEGATIVE
Urine Specific Gravity: 1.03 (ref 1.001–1.035)
Urine Urobilinogen: NORMAL mg/dL (ref 0.2–2.0)
Urine pH: 6 (ref 5.0–8.0)

## 2022-11-24 LAB — LAB USE ONLY - URINE GRAY CULTURE HOLD TUBE

## 2022-11-24 MED ORDER — CEPHALEXIN 500 MG PO CAPS
500.0000 mg | ORAL_CAPSULE | Freq: Once | ORAL | Status: AC
Start: 2022-11-24 — End: 2022-11-24
  Administered 2022-11-24: 500 mg via ORAL
  Filled 2022-11-24: qty 1

## 2022-11-24 MED ORDER — ONDANSETRON 4 MG PO TBDP
4.0000 mg | ORAL_TABLET | Freq: Once | ORAL | Status: AC
Start: 2022-11-24 — End: 2022-11-24
  Administered 2022-11-24: 4 mg via ORAL
  Filled 2022-11-24: qty 1

## 2022-11-24 MED ORDER — OXYCODONE HCL 5 MG PO TABS
5.0000 mg | ORAL_TABLET | Freq: Once | ORAL | Status: AC
Start: 2022-11-24 — End: 2022-11-24
  Administered 2022-11-24: 5 mg via ORAL
  Filled 2022-11-24: qty 1

## 2022-11-24 MED ORDER — MORPHINE SULFATE 2 MG/ML IJ/IV SOLN (WRAP)
4.0000 mg | Freq: Once | Status: DC
Start: 2022-11-24 — End: 2022-11-24

## 2022-11-24 MED ORDER — ONDANSETRON HCL 4 MG/2ML IJ SOLN
4.0000 mg | Freq: Once | INTRAMUSCULAR | Status: DC
Start: 2022-11-24 — End: 2022-11-24

## 2022-11-24 MED ORDER — CEPHALEXIN 500 MG PO CAPS
500.0000 mg | ORAL_CAPSULE | Freq: Two times a day (BID) | ORAL | 0 refills | Status: AC
Start: 2022-11-24 — End: 2022-12-01

## 2022-11-24 NOTE — ED Triage Notes (Signed)
Patient placed in ED C 1 instructed to provide urine cc

## 2022-11-24 NOTE — ED Provider Notes (Signed)
[facility] EMERGENCY DEPARTMENT  ATTENDING PHYSICIAN HISTORY AND PHYSICAL EXAM     Patient Name: Gary [name],[name]  Department:[facility]  Encounter Date:  11/24/2022  Attending Physician: Britt Boozer, MD   Age: 42 y.o. male  Patient Room: S C1/S C1  PCP: Pcp, None, MD           Diagnosis/Disposition:     Final diagnoses:   Abdominal pain, unspecified abdominal location   Dysuria   Pyuria       ED Disposition       ED Disposition   Discharge    Condition   --    Date/Time   Wed Nov 24, 2022  8:12 AM    Comment   Gary Och. discharge to home/self care.    Condition at disposition: Stable                 Follow-Up Providers (if applicable)    No follow-up provider specified.     Discharge Medication List as of 11/24/2022  8:12 AM        START taking these medications    Details   cephALEXin (KEFLEX) 500 MG capsule Take 1 capsule (500 mg) by mouth 2 (two) times daily for 7 days, Starting Wed 11/24/2022, Until Wed 12/01/2022, Print                 Medical Decision Making:     Initial Differential Diagnosis:  Initial differential diagnosis to include but not limited to: appendicitis, bowel obstruction, biliary pathology, ischemic bowel, colitis, urinary tract infection, diverticulitis, bowel perforation, intraabdominal abscess, renal stone    Plan:  Urine analysis, urine culture, CT abdomen pelvis without contrast    Final Impression:  Dysuria  Abdominal pain  Pyuria  The patient was deemed stable for discharge. They were given strict return precautions as it relates to their presumed diagnosis, verbalized understanding of these precautions and agreed to follow up as instructed. All questions were answered prior to discharge.    ED Course as of 11/24/22 0829   Wed Nov 24, 2022   9604 Awaiting CT read. Called reading radiologist x2, no answer.  [SM]      ED Course User Index  [SM] Kalisi Bevill, Reita Chard, MD     ED Medication Orders (From admission, onward)      Start Ordered     Status Ordering Provider    11/24/22  [phone] 11/24/22 0810  cephALEXin (KEFLEX) capsule 500 mg  Once        Route: Oral  Ordered Dose: 500 mg       Last MAR action: Given Jenascia Bumpass A    11/24/22 0634 11/24/22 0633  ondansetron (ZOFRAN-ODT) disintegrating tablet 4 mg  Once        Route: Oral  Ordered Dose: 4 mg       Last MAR action: Given Chondra Boyde A    11/24/22 0634 11/24/22 0633  oxyCODONE (ROXICODONE) immediate release tablet 5 mg  Once in ED        Route: Oral  Ordered Dose: 5 mg       Last MAR action: Given Taeko Schaffer A    11/24/22 0622 11/24/22 0621    Once        Route: Intravenous  Ordered Dose: 4 mg       Discontinued Comfort Iversen A    11/24/22 0622 11/24/22 0621    Once        Route: Intravenous  Ordered Dose: 4 mg       Discontinued Caci Orren A            Medical Decision Making  Amount and/or Complexity of Data Reviewed  Labs: ordered.    Risk  Prescription drug management.    Patient presents with abdominal pain, later reports dysuria.  His CT today shows no ureteral stones, and no findings to explain his complaints.  His urine analysis shows trace leukocyte esterase and 6-10 white blood cells.  Given the dysuria and abdominal pain, I will treat for a lower UTI.  Urine culture ordered and pending at time of discharge.  I reviewed the results with the patient, he is agreeable to the plan.  We discussed return precautions.                        History of Presenting Illness:     Nursing Triage note: pt ambulatory into ED c/o LLQ  and L flank pain x2-3 months a/w dysuria, pt states pain got worse tonight, unable to sleep. hx of kidney stones Denies fevers/chills. Pt endorses daily etoh, last drink monday. Denies seizure hx  Chief complaint: Abdominal Pain    Gary Gillis Holthusen. is a 42 y.o. male with PMHx of anxiety and depression who presents with right sided abdominal/flank pain that has worsened since being diagnosed with a right sided kidney stone  ~2 weeks ago. Was also recently admitted to the psych unit at this  hospital from 7/11-7/31/2024 and had CT scan and US showing right nephrolithiasis. Was instructed to follow up with urology but has not done so since being discharged. Endorses dysuria. Denies fevers, chills, sweats, nausea, vomiting, or  diarrhea. Denies Hx of prior abdominal surgeries or Hx of prior kidney stones. Denies PMHx. States he is allergic to multiple psych meds. Not currently taking any prescriptions. Reports Hx of wisdom teet surgery, but no other surgical Hx.         Review of Systems:  Physical Exam:     Review of Systems    Positive and negative ROS per above and in HPI. All other systems reviewed and negative.     Pulse 98  BP 132/85  Resp 20  SpO2 96 %  Temp 98.8 F (37.1 C)       Constitutional: Vital signs reviewed.  Nontoxic appearing.  No apparent distress.  Head: Normocephalic, atraumatic  Eyes: No conjunctival injection. No discharge. No scleral icterus.  Respiratory/Chest: Clear to auscultation. No respiratory distress.   Cardiovascular: Regular rate and rhythm. No murmur appreciated.   Abdomen: Normal bowel sounds. Soft and non-tender. No guarding. No masses or hepatosplenomegaly. Distended.  Genitourinary: Not examined.  Upper Extremity: No edema. No cyanosis. No gross deformities.  Lower Extremity: No edema. No cyanosis. No gross deformities.  Neurological: No focal motor deficits by observation. Speech normal. Alert and oriented.  Skin: Warm and well-perfused, dry. No rash.  Psychiatric: Normal affect. Normal concentration.            Interpretations, Clinical Decision Tools and Critical Care:                Procedures:   Procedures      Attestations:     Scribe Attestation: I was acting as a Neurosurgeon for [employer], Reita Chard, MD on Gary [name],[name]  Scribe: Julieanne Cotton     I am the first provider for this patient and I personally performed the services documented. Arlys Kenyada  Murrell Redden is scribing for me on Gary [name],Gary TannerMarland Kitchen This note accurately reflects work and decisions made by  me.  Signe Tackitt, Reita Chard, MD    Documentation Notes:  Parts of this note were generated by the Epic EMR system/ Dragon speech recognition and may contain inherent errors or omissions not intended by the user. Grammatical errors, random word insertions, deletions, pronoun errors and incomplete sentences are occasional consequences of this technology due to software limitations. Not all errors are caught or corrected.  My documentation is often completed after the patient is no longer under my clinical care. In some cases, the Epic EMR may pull updated results into the above documentation which may not reflect all results or information that were available to me at the time of my medical decision making.   If there are questions or concerns about the content of this note or information contained within the body of this dictation they should be addressed directly with the author for clarification.                  Britt Boozer, MD  11/24/22 [phone]

## 2022-11-24 NOTE — EDIE (Signed)
[facility]?NOTIFICATION?11/24/2022 04:46?DONYALE, ARCARI A?MRN: [id number]    Criteria Met      3 Different Facilities in 90 Days    5 ED Visits in 12 Months    History of Alcohol Use Disorder (12 mo.)    History of Suicide Ideation or Self-Harm (12 mo.)    Security and Safety  No Security Events were found.  ED Care Guidelines  There are currently no ED Care Guidelines for this patient. Please check your facility's medical records system.        Prescription Monitoring Program  Narx Score not available at this time.    E.D. Visit Count (12 mo.)  Facility Visits   [facility] 4   [place] [facility] 2   [facility] 2   [place] [facility] 1   [facility] 1   [facility] 1   [facility] 1   Total 12   Note: Visits indicate total known visits.     Recent Emergency Department Visit Summary  Showing 10 most recent visits out of 12 in the past 12 months   Date Facility [facility] Type Diagnoses or Chief Complaint    Nov 24, 2022  [place]  [place]  [place]  Emergency      triage -abd pain      Nov 23, 2022  [place] [facility]  [place]  [place]  Emergency      Unspecified mood [affective] disorder      Suicidal ideations      Alcohol use, unspecified with intoxication, uncomplicated      Alcohol Intoxication      Suicidal thoughts      Medic      Nov 21, 2022  [facility]  [place]  [place]  Emergency      Alcohol use, unspecified with intoxication, uncomplicated      Suicidal ideations      Adjustment disorder with depressed mood      Depression      Stomach Pain      Oct 28, 2022  [facility]  [place]  [place]  Emergency      Alcohol dependence with intoxication, uncomplicated      Suicidal ideations      Psychiatric Evaluation      Suicidal      triage      Oct 28, 2022  [facility] [place]  [place]  [place]  Emergency      flank pain      Oct 21, 2022  [facility]  [place]  Emergency      Other specified abnormal findings of  blood chemistry      Hypocalcemia      Suicidal ideations      Depression, unspecified      Calculus of kidney      Other microscopic hematuria      Suicidal Ideation      [facility]      Oct 19, 2022  [facility]  [place]  [place]  Emergency      Depression      [place] depression      Oct 18, 2022  [facility].  [place]  Emergency      Alcohol use, unspecified with intoxication, uncomplicated      Alcohol Intoxication      SUICIDAL AND INTOXICATED.  SUDICIAL AND INTOXICATED.      Oct 16, 2022  [facility]  [place]  [place]  Emergency      Alcohol use, unspecified with intoxication, uncomplicated      Alcohol Intoxication      Pain, Flank      side pain      Oct 14, 2022  [facility]  [place]  Emergency      Other specified personal risk factors, not elsewhere classified      Chest pain, unspecified      Unspecified renal colic      Abdominal Pain      lightheadedness and side pain        Recent Inpatient Visit Summary  No Recent Inpatient Visits were found.  Care Team  No Care Team was found.  [facility]  This patient has registered at the [facility] Emergency Department  For more information visit: [email or web address]     PLEASE NOTE:     1.   Any care recommendations and other clinical information are provided as guidelines or for historical purposes only, and providers should exercise their own clinical judgment when providing care.    2.   You may only use this information for purposes of treatment, payment or health care operations activities, and subject to the limitations of applicable [facility] Policies.    3.   You should consult directly with the organization that provided a care guideline or other clinical history with any questions about additional information or accuracy or completeness of information provided.    42 CFR Part 2 or state confidentiality law prohibits unauthorized disclosure of these records. This information has been  disclosed to you from the records whose confidentiality is or may be protected by state law. State law prohibits you from making any further disclosure of it without the specific written authorization of the person to whom it pertains, or as otherwise permitted by state law. You may view an electronic version of the patient's consent in the portal.   ? 2024 [facility] - [email or web address]

## 2022-11-24 NOTE — ED Notes (Signed)
Pt updated about plan of care states understanding awaiting CT read

## 2022-11-24 NOTE — ED Notes (Signed)
MD at bedside. 

## 2022-11-24 NOTE — Discharge Instructions (Signed)
Dear Gary [name]:    Thank you for choosing the [facility] Emergency Department, the premier emergency department in the [place] area.  I hope your visit today was EXCELLENT. You will receive a survey via text message that will give you the opportunity to provide feedback to your team about your visit. Please do not hesitate to reach out with any questions!    Specific instructions for your visit today:    Take antibiotics as prescribed.    IF YOU DO NOT CONTINUE TO IMPROVE OR YOUR CONDITION WORSENS, PLEASE CONTACT YOUR DOCTOR OR RETURN IMMEDIATELY TO THE EMERGENCY DEPARTMENT.    Sincerely,  Mali Eppard, Reita Chard, MD  Attending Emergency Physician  [facility] Emergency Department      OBTAINING A PRIMARY CARE APPOINTMENT    Primary care physicians (PCPs, also known as primary care doctors) are either internists or family medicine doctors. Both types of PCPs focus on health promotion, disease prevention, patient education and counseling, and treatment of acute and chronic medical conditions.    If you need a primary care doctor, please call the below number and ask who is receiving new patients.     [facility]  Telephone:  [phone]  [email or web address]    DOCTOR REFERRALS  Call [phone] (available 24 hours a day, 7 days a week) if you need any further referrals and we can help you find a primary care doctor or specialist.  Also, available online at:  [email or web address]    YOUR CONTACT INFORMATION  Before leaving please check with registration to make sure we have an up-to-date contact number.  You can call registration at [phone] to update your information.  For questions about your hospital bill, please call [phone].  For questions about your Emergency Dept Physician bill please call [phone].      FREE HEALTH SERVICES  If you need help with health or social services, please call 2-1-1 for a free referral to resources in your area.  2-1-1 is a free  service connecting people with information on health insurance, free clinics, pregnancy, mental health, dental care, food assistance, housing, and substance abuse counseling.  Also, available online at:  http://www.[place].org    ORTHOPEDIC INJURY   Please know that significant injuries can exist even when an initial x-ray is read as normal or negative.  This can occur because some fractures (broken bones) are not initially visible on x-rays.  For this reason, close outpatient follow-up with your primary care doctor or bone specialist (orthopedist) is required.    MEDICATIONS AND FOLLOWUP  Please be aware that some prescription medications can cause drowsiness.  Use caution when driving or operating machinery.    The examination and treatment you have received in our Emergency Department is provided on an emergency basis, and is not intended to be a substitute for your primary care physician.  It is important that your doctor checks you again and that you report any new or remaining problems at that time.      ASSISTANCE WITH INSURANCE    Affordable Care Act  [facility])  Call to start or finish an application, compare plans, enroll or ask a question.  [phone]  TTY: 6466684382  Web:  Healthcare.gov    Help Enrolling in [facility]  [employer] [place]  (219)439-9534 (TOLL-FREE)  [phone] (TTY)  Web:  Http://www.coverva.org    Local Help Enrolling in the [facility]  [place] [facility]  743-277-4162 (MAIN)  Email:  health-help@nvfs .org  Web:  BlackjackMyths.is  Address:  [address], [address] [place], [place] [address]    SEDATING MEDICATIONS  Sedating medications include strong pain medications (e.g. narcotics), muscle relaxers, benzodiazepines (used for anxiety and as muscle relaxers), Benadryl/diphenhydramine and other antihistamines for allergic reactions/itching, and other medications.  If you are unsure if you have received a sedating medication, please ask your physician or nurse.  If  you received a sedating medication: DO NOT drive a car. DO NOT operate machinery. DO NOT perform jobs where you need to be alert.  DO NOT drink alcoholic beverages while taking this medicine.     If you get dizzy, sit or lie down at the first signs. Be careful going up and down stairs.  Be extra careful to prevent falls.     Never give this medicine to others.     Keep this medicine out of reach of children.     Do not take or save old medicines. Throw them away when outdated.     Keep all medicines in a cool, dry place. DO NOT keep them in your bathroom medicine cabinet or in a cabinet above the stove.    MEDICATION REFILLS  Please be aware that we cannot refill any prescriptions through the ER. If you need further treatment from what is provided at your ER visit, please follow up with your primary care doctor or your pain management specialist.    FREESTANDING EMERGENCY DEPARTMENTS OF [facility]  Did you know [facility] has two freestanding ERs located just a few miles away?  [facility] [place] and [facility] [place] have short wait times, easy free parking directly in front of the building and top patient satisfaction scores - and the same Board Certified Emergency Medicine doctors as [facility].

## 2022-11-24 NOTE — ED Notes (Signed)
Assumed care of pt. Pt lying in stretcher w/ eyes closed, respirations regular and unlabored. NAD. Pending CT results at this time.

## 2022-11-24 NOTE — ED Notes (Signed)
Dr. McKenzie at bedside.  

## 2022-11-25 LAB — CULTURE, URINE: Culture Urine: NO GROWTH
